# Patient Record
Sex: Female | Born: 1955 | Race: White | Hispanic: No | Smoking: Never smoker
Health system: Southern US, Community
[De-identification: ages and names within clinical notes are randomized; demographics above are authoritative.]

## PROBLEM LIST (undated history)

## (undated) DIAGNOSIS — I639 Cerebral infarction, unspecified: Secondary | ICD-10-CM

## (undated) DIAGNOSIS — D649 Anemia, unspecified: Secondary | ICD-10-CM

## (undated) DIAGNOSIS — T4145XA Adverse effect of unspecified anesthetic, initial encounter: Secondary | ICD-10-CM

## (undated) DIAGNOSIS — J189 Pneumonia, unspecified organism: Secondary | ICD-10-CM

## (undated) DIAGNOSIS — J690 Pneumonitis due to inhalation of food and vomit: Secondary | ICD-10-CM

## (undated) DIAGNOSIS — F840 Autistic disorder: Secondary | ICD-10-CM

## (undated) DIAGNOSIS — F419 Anxiety disorder, unspecified: Secondary | ICD-10-CM

## (undated) DIAGNOSIS — F32A Depression, unspecified: Secondary | ICD-10-CM

## (undated) DIAGNOSIS — F71 Moderate intellectual disabilities: Secondary | ICD-10-CM

## (undated) DIAGNOSIS — R569 Unspecified convulsions: Secondary | ICD-10-CM

## (undated) DIAGNOSIS — M436 Torticollis: Secondary | ICD-10-CM

## (undated) DIAGNOSIS — K810 Acute cholecystitis: Secondary | ICD-10-CM

## (undated) DIAGNOSIS — C23 Malignant neoplasm of gallbladder: Secondary | ICD-10-CM

## (undated) DIAGNOSIS — T17800A Unspecified foreign body in other parts of respiratory tract causing asphyxiation, initial encounter: Secondary | ICD-10-CM

## (undated) DIAGNOSIS — Z8669 Personal history of other diseases of the nervous system and sense organs: Secondary | ICD-10-CM

## (undated) DIAGNOSIS — M797 Fibromyalgia: Secondary | ICD-10-CM

## (undated) DIAGNOSIS — T8859XA Other complications of anesthesia, initial encounter: Secondary | ICD-10-CM

## (undated) DIAGNOSIS — F329 Major depressive disorder, single episode, unspecified: Secondary | ICD-10-CM

## (undated) HISTORY — PX: WISDOM TOOTH EXTRACTION: SHX21

## (undated) HISTORY — PX: COLONOSCOPY: SHX174

## (undated) HISTORY — PX: BREAST BIOPSY: SHX20

---

## 1997-08-10 ENCOUNTER — Ambulatory Visit (HOSPITAL_COMMUNITY): Admission: RE | Admit: 1997-08-10 | Discharge: 1997-08-10 | Payer: Self-pay | Admitting: *Deleted

## 1997-08-14 ENCOUNTER — Inpatient Hospital Stay (HOSPITAL_COMMUNITY): Admission: AD | Admit: 1997-08-14 | Discharge: 1997-08-14 | Payer: Self-pay | Admitting: *Deleted

## 1997-08-17 ENCOUNTER — Inpatient Hospital Stay (HOSPITAL_COMMUNITY): Admission: RE | Admit: 1997-08-17 | Discharge: 1997-08-17 | Payer: Self-pay | Admitting: *Deleted

## 1997-09-04 ENCOUNTER — Other Ambulatory Visit: Admission: RE | Admit: 1997-09-04 | Discharge: 1997-09-04 | Payer: Self-pay | Admitting: *Deleted

## 1997-09-16 ENCOUNTER — Inpatient Hospital Stay (HOSPITAL_COMMUNITY): Admission: AD | Admit: 1997-09-16 | Discharge: 1997-09-16 | Payer: Self-pay | Admitting: *Deleted

## 1998-01-15 ENCOUNTER — Encounter: Payer: Self-pay | Admitting: Family Medicine

## 1998-01-15 ENCOUNTER — Ambulatory Visit (HOSPITAL_COMMUNITY): Admission: RE | Admit: 1998-01-15 | Discharge: 1998-01-15 | Payer: Self-pay | Admitting: Family Medicine

## 1998-02-17 ENCOUNTER — Inpatient Hospital Stay (HOSPITAL_COMMUNITY): Admission: AD | Admit: 1998-02-17 | Discharge: 1998-02-17 | Payer: Self-pay | Admitting: Obstetrics and Gynecology

## 1998-09-13 ENCOUNTER — Other Ambulatory Visit: Admission: RE | Admit: 1998-09-13 | Discharge: 1998-09-13 | Payer: Self-pay | Admitting: *Deleted

## 1999-04-01 ENCOUNTER — Ambulatory Visit (HOSPITAL_COMMUNITY): Admission: RE | Admit: 1999-04-01 | Discharge: 1999-04-01 | Payer: Self-pay | Admitting: *Deleted

## 1999-04-01 ENCOUNTER — Encounter: Payer: Self-pay | Admitting: *Deleted

## 1999-09-10 ENCOUNTER — Other Ambulatory Visit: Admission: RE | Admit: 1999-09-10 | Discharge: 1999-09-10 | Payer: Self-pay | Admitting: Obstetrics and Gynecology

## 1999-12-10 ENCOUNTER — Inpatient Hospital Stay (HOSPITAL_COMMUNITY): Admission: EM | Admit: 1999-12-10 | Discharge: 1999-12-13 | Payer: Self-pay | Admitting: *Deleted

## 1999-12-12 ENCOUNTER — Encounter: Payer: Self-pay | Admitting: *Deleted

## 2000-04-06 ENCOUNTER — Ambulatory Visit (HOSPITAL_COMMUNITY): Admission: RE | Admit: 2000-04-06 | Discharge: 2000-04-06 | Payer: Self-pay | Admitting: *Deleted

## 2000-04-06 ENCOUNTER — Encounter: Payer: Self-pay | Admitting: *Deleted

## 2000-10-01 ENCOUNTER — Other Ambulatory Visit: Admission: RE | Admit: 2000-10-01 | Discharge: 2000-10-01 | Payer: Self-pay | Admitting: Obstetrics and Gynecology

## 2001-04-14 ENCOUNTER — Ambulatory Visit (HOSPITAL_COMMUNITY): Admission: RE | Admit: 2001-04-14 | Discharge: 2001-04-14 | Payer: Self-pay | Admitting: *Deleted

## 2001-04-14 ENCOUNTER — Encounter: Payer: Self-pay | Admitting: *Deleted

## 2001-10-04 ENCOUNTER — Other Ambulatory Visit: Admission: RE | Admit: 2001-10-04 | Discharge: 2001-10-04 | Payer: Self-pay | Admitting: Obstetrics & Gynecology

## 2002-04-11 ENCOUNTER — Encounter: Payer: Self-pay | Admitting: *Deleted

## 2002-04-11 ENCOUNTER — Encounter: Admission: RE | Admit: 2002-04-11 | Discharge: 2002-04-11 | Payer: Self-pay | Admitting: *Deleted

## 2002-10-14 ENCOUNTER — Other Ambulatory Visit: Admission: RE | Admit: 2002-10-14 | Discharge: 2002-10-14 | Payer: Self-pay | Admitting: Obstetrics & Gynecology

## 2003-04-26 ENCOUNTER — Ambulatory Visit (HOSPITAL_COMMUNITY): Admission: RE | Admit: 2003-04-26 | Discharge: 2003-04-26 | Payer: Self-pay | Admitting: *Deleted

## 2003-11-15 ENCOUNTER — Emergency Department (HOSPITAL_COMMUNITY): Admission: EM | Admit: 2003-11-15 | Discharge: 2003-11-15 | Payer: Self-pay | Admitting: Emergency Medicine

## 2004-04-26 ENCOUNTER — Ambulatory Visit (HOSPITAL_COMMUNITY): Admission: RE | Admit: 2004-04-26 | Discharge: 2004-04-26 | Payer: Self-pay | Admitting: Family Medicine

## 2004-05-09 ENCOUNTER — Encounter: Admission: RE | Admit: 2004-05-09 | Discharge: 2004-05-09 | Payer: Self-pay | Admitting: Family Medicine

## 2005-08-19 ENCOUNTER — Encounter: Admission: RE | Admit: 2005-08-19 | Discharge: 2005-08-19 | Payer: Self-pay | Admitting: Family Medicine

## 2006-02-05 ENCOUNTER — Other Ambulatory Visit: Admission: RE | Admit: 2006-02-05 | Discharge: 2006-02-05 | Payer: Self-pay | Admitting: Family Medicine

## 2006-10-08 ENCOUNTER — Encounter: Admission: RE | Admit: 2006-10-08 | Discharge: 2006-10-08 | Payer: Self-pay | Admitting: Family Medicine

## 2007-04-30 ENCOUNTER — Encounter: Admission: RE | Admit: 2007-04-30 | Discharge: 2007-04-30 | Payer: Self-pay | Admitting: Family Medicine

## 2008-12-22 ENCOUNTER — Encounter: Admission: RE | Admit: 2008-12-22 | Discharge: 2008-12-22 | Payer: Self-pay | Admitting: Family Medicine

## 2009-01-08 ENCOUNTER — Encounter: Admission: RE | Admit: 2009-01-08 | Discharge: 2009-01-08 | Payer: Self-pay | Admitting: Family Medicine

## 2009-03-10 DIAGNOSIS — I639 Cerebral infarction, unspecified: Secondary | ICD-10-CM

## 2009-03-10 HISTORY — DX: Cerebral infarction, unspecified: I63.9

## 2009-11-19 ENCOUNTER — Inpatient Hospital Stay (HOSPITAL_COMMUNITY): Admission: EM | Admit: 2009-11-19 | Discharge: 2009-11-26 | Payer: Self-pay | Admitting: Emergency Medicine

## 2009-11-20 ENCOUNTER — Encounter (INDEPENDENT_AMBULATORY_CARE_PROVIDER_SITE_OTHER): Payer: Self-pay | Admitting: Internal Medicine

## 2009-11-21 ENCOUNTER — Ambulatory Visit: Payer: Self-pay | Admitting: Vascular Surgery

## 2009-11-21 ENCOUNTER — Encounter (INDEPENDENT_AMBULATORY_CARE_PROVIDER_SITE_OTHER): Payer: Self-pay | Admitting: Internal Medicine

## 2010-01-21 ENCOUNTER — Ambulatory Visit: Payer: Self-pay | Admitting: Pulmonary Disease

## 2010-01-24 ENCOUNTER — Ambulatory Visit: Payer: Self-pay | Admitting: Psychiatry

## 2010-02-14 ENCOUNTER — Inpatient Hospital Stay (HOSPITAL_COMMUNITY): Admission: EM | Admit: 2010-02-14 | Discharge: 2010-01-29 | Payer: Self-pay | Admitting: Emergency Medicine

## 2010-03-31 ENCOUNTER — Encounter: Payer: Self-pay | Admitting: Family Medicine

## 2010-05-21 LAB — BASIC METABOLIC PANEL
BUN: 17 mg/dL (ref 6–23)
BUN: 3 mg/dL — ABNORMAL LOW (ref 6–23)
CO2: 22 mEq/L (ref 19–32)
Calcium: 8.2 mg/dL — ABNORMAL LOW (ref 8.4–10.5)
Calcium: 8.2 mg/dL — ABNORMAL LOW (ref 8.4–10.5)
Calcium: 8.4 mg/dL (ref 8.4–10.5)
Calcium: 8.5 mg/dL (ref 8.4–10.5)
Chloride: 98 mEq/L (ref 96–112)
Creatinine, Ser: 0.52 mg/dL (ref 0.4–1.2)
Creatinine, Ser: 2.14 mg/dL — ABNORMAL HIGH (ref 0.4–1.2)
GFR calc Af Amer: 29 mL/min — ABNORMAL LOW (ref >60)
GFR calc Af Amer: 31 mL/min — ABNORMAL LOW (ref >60)
GFR calc Af Amer: >60 mL/min (ref >60)
GFR calc non Af Amer: 24 mL/min — ABNORMAL LOW (ref >60)
GFR calc non Af Amer: 26 mL/min — ABNORMAL LOW (ref >60)
GFR calc non Af Amer: 27 mL/min — ABNORMAL LOW (ref >60)
GFR calc non Af Amer: >60 mL/min (ref >60)
GFR calc non Af Amer: >60 mL/min (ref >60)
Glucose, Bld: 103 mg/dL — ABNORMAL HIGH (ref 70–99)
Glucose, Bld: 88 mg/dL (ref 70–99)
Potassium: 3.8 mEq/L (ref 3.5–5.1)
Potassium: 3.9 mEq/L (ref 3.5–5.1)
Sodium: 135 mEq/L (ref 135–145)
Sodium: 140 mEq/L (ref 135–145)
Sodium: 142 mEq/L (ref 135–145)

## 2010-05-21 LAB — GLUCOSE, CAPILLARY
Glucose-Capillary: 108 mg/dL — ABNORMAL HIGH (ref 70–99)
Glucose-Capillary: 123 mg/dL — ABNORMAL HIGH (ref 70–99)
Glucose-Capillary: 92 mg/dL (ref 70–99)
Glucose-Capillary: 95 mg/dL (ref 70–99)

## 2010-05-21 LAB — VITAMIN B12: Vitamin B-12: 1474 pg/mL — ABNORMAL HIGH (ref 211–911)

## 2010-05-21 LAB — CROSSMATCH
Antibody Screen: NEGATIVE
Unit division: 0

## 2010-05-21 LAB — URINALYSIS, ROUTINE W REFLEX MICROSCOPIC
Nitrite: POSITIVE — AB
Specific Gravity, Urine: 1.022 (ref 1.005–1.030)
pH: 6.5 (ref 5.0–8.0)

## 2010-05-21 LAB — COMPREHENSIVE METABOLIC PANEL
AST: 18 U/L (ref 0–37)
Albumin: 2.2 g/dL — ABNORMAL LOW (ref 3.5–5.2)
BUN: 1 mg/dL — ABNORMAL LOW (ref 6–23)
BUN: 8 mg/dL (ref 6–23)
CO2: 24 mEq/L (ref 19–32)
CO2: 25 mEq/L (ref 19–32)
Calcium: 8.2 mg/dL — ABNORMAL LOW (ref 8.4–10.5)
Chloride: 95 mEq/L — ABNORMAL LOW (ref 96–112)
Chloride: 99 mEq/L (ref 96–112)
Creatinine, Ser: 0.54 mg/dL (ref 0.4–1.2)
Creatinine, Ser: 0.61 mg/dL (ref 0.4–1.2)
Creatinine, Ser: 0.72 mg/dL (ref 0.4–1.2)
GFR calc Af Amer: >60 mL/min (ref >60)
GFR calc non Af Amer: >60 mL/min (ref >60)
GFR calc non Af Amer: >60 mL/min (ref >60)
Glucose, Bld: 124 mg/dL — ABNORMAL HIGH (ref 70–99)
Sodium: 132 mEq/L — ABNORMAL LOW (ref 135–145)
Total Bilirubin: 0.3 mg/dL (ref 0.3–1.2)
Total Bilirubin: 0.4 mg/dL (ref 0.3–1.2)

## 2010-05-21 LAB — URINE CULTURE
Culture  Setup Time: 201111151123
Special Requests: NEGATIVE

## 2010-05-21 LAB — DIFFERENTIAL
Eosinophils Relative: 0 % (ref 0–5)
Lymphs Abs: 1.4 10*3/uL (ref 0.7–4.0)
Monocytes Relative: 10 % (ref 3–12)
Neutro Abs: 23.6 10*3/uL — ABNORMAL HIGH (ref 1.7–7.7)

## 2010-05-21 LAB — CBC
HCT: 22.6 % — ABNORMAL LOW (ref 36.0–46.0)
HCT: 24.8 % — ABNORMAL LOW (ref 36.0–46.0)
Hemoglobin: 7.7 g/dL — ABNORMAL LOW (ref 12.0–15.0)
Hemoglobin: 8 g/dL — ABNORMAL LOW (ref 12.0–15.0)
Hemoglobin: 9 g/dL — ABNORMAL LOW (ref 12.0–15.0)
MCH: 30.4 pg (ref 26.0–34.0)
MCH: 30.8 pg (ref 26.0–34.0)
MCH: 31 pg (ref 26.0–34.0)
MCHC: 34 g/dL (ref 30.0–36.0)
MCHC: 34 g/dL (ref 30.0–36.0)
MCHC: 34 g/dL (ref 30.0–36.0)
MCHC: 34.1 g/dL (ref 30.0–36.0)
MCHC: 35.1 g/dL (ref 30.0–36.0)
MCV: 89.1 fL (ref 78.0–100.0)
MCV: 89.3 fL (ref 78.0–100.0)
MCV: 90.3 fL (ref 78.0–100.0)
Platelets: 463 10*3/uL — ABNORMAL HIGH (ref 150–400)
Platelets: 499 10*3/uL — ABNORMAL HIGH (ref 150–400)
Platelets: 527 10*3/uL — ABNORMAL HIGH (ref 150–400)
Platelets: 578 10*3/uL — ABNORMAL HIGH (ref 150–400)
RBC: 2.59 MIL/uL — ABNORMAL LOW (ref 3.87–5.11)
RBC: 3.17 MIL/uL — ABNORMAL LOW (ref 3.87–5.11)
RDW: 16.5 % — ABNORMAL HIGH (ref 11.5–15.5)
RDW: 16.9 % — ABNORMAL HIGH (ref 11.5–15.5)
WBC: 15.5 10*3/uL — ABNORMAL HIGH (ref 4.0–10.5)
WBC: 18.8 10*3/uL — ABNORMAL HIGH (ref 4.0–10.5)
WBC: 27.8 10*3/uL — ABNORMAL HIGH (ref 4.0–10.5)

## 2010-05-21 LAB — VANCOMYCIN, TROUGH: Vancomycin Tr: 56.5 ug/mL (ref 10.0–20.0)

## 2010-05-21 LAB — URINE MICROSCOPIC-ADD ON

## 2010-05-21 LAB — HEMOGLOBIN A1C
Hgb A1c MFr Bld: 5.7 % — ABNORMAL HIGH (ref <5.7)
Mean Plasma Glucose: 117 mg/dL — ABNORMAL HIGH (ref <117)

## 2010-05-21 LAB — ABO/RH: ABO/RH(D): O NEG

## 2010-05-21 LAB — OVA AND PARASITE EXAMINATION: Ova and parasites: NONE SEEN

## 2010-05-21 LAB — RETICULOCYTES
RBC.: 3 MIL/uL — ABNORMAL LOW (ref 3.87–5.11)
Retic Count, Absolute: 33 10*3/uL (ref 19.0–186.0)

## 2010-05-21 LAB — PROCALCITONIN: Procalcitonin: 0.54 ng/mL

## 2010-05-21 LAB — IRON AND TIBC
Iron: 11 ug/dL — ABNORMAL LOW (ref 42–135)
TIBC: 143 ug/dL — ABNORMAL LOW (ref 250–470)

## 2010-05-21 LAB — LACTIC ACID, PLASMA: Lactic Acid, Venous: 1 mmol/L (ref 0.5–2.2)

## 2010-05-21 LAB — HEMOCCULT GUIAC POC 1CARD (OFFICE): Fecal Occult Bld: NEGATIVE

## 2010-05-21 LAB — CULTURE, BLOOD (ROUTINE X 2)
Culture  Setup Time: 201111150825
Culture: NO GROWTH

## 2010-05-21 LAB — STOOL CULTURE

## 2010-05-21 LAB — MRSA CULTURE

## 2010-05-21 LAB — FERRITIN: Ferritin: 575 ng/mL — ABNORMAL HIGH (ref 10–291)

## 2010-05-21 LAB — LIPASE, BLOOD: Lipase: 20 U/L (ref 11–59)

## 2010-05-23 LAB — COMPREHENSIVE METABOLIC PANEL
AST: 20 U/L (ref 0–37)
Albumin: 3.7 g/dL (ref 3.5–5.2)
Alkaline Phosphatase: 109 U/L (ref 39–117)
Chloride: 86 mEq/L — ABNORMAL LOW (ref 96–112)
GFR calc Af Amer: >60 mL/min (ref >60)
Potassium: 3.9 mEq/L (ref 3.5–5.1)
Sodium: 120 mEq/L — ABNORMAL LOW (ref 135–145)
Total Bilirubin: 0.6 mg/dL (ref 0.3–1.2)
Total Protein: 7.1 g/dL (ref 6.0–8.3)

## 2010-05-23 LAB — BASIC METABOLIC PANEL
BUN: 16 mg/dL (ref 6–23)
BUN: 27 mg/dL — ABNORMAL HIGH (ref 6–23)
BUN: 14 mg/dL (ref 6–23)
CO2: 26 mEq/L (ref 19–32)
CO2: 27 mEq/L (ref 19–32)
Calcium: 9.1 mg/dL (ref 8.4–10.5)
Chloride: 82 mEq/L — ABNORMAL LOW (ref 96–112)
Chloride: 85 mEq/L — ABNORMAL LOW (ref 96–112)
Chloride: 86 mEq/L — ABNORMAL LOW (ref 96–112)
Chloride: 86 mEq/L — ABNORMAL LOW (ref 96–112)
Chloride: 83 mEq/L — ABNORMAL LOW (ref 96–112)
Creatinine, Ser: 1.24 mg/dL — ABNORMAL HIGH (ref 0.4–1.2)
Creatinine, Ser: 0.74 mg/dL (ref 0.4–1.2)
GFR calc Af Amer: 55 mL/min — ABNORMAL LOW (ref >60)
GFR calc Af Amer: >60 mL/min (ref >60)
GFR calc Af Amer: >60 mL/min (ref >60)
GFR calc Af Amer: >60 mL/min (ref >60)
GFR calc Af Amer: >60 mL/min (ref >60)
GFR calc Af Amer: >60 mL/min (ref >60)
GFR calc non Af Amer: 45 mL/min — ABNORMAL LOW (ref >60)
GFR calc non Af Amer: 50 mL/min — ABNORMAL LOW (ref >60)
GFR calc non Af Amer: >60 mL/min (ref >60)
GFR calc non Af Amer: >60 mL/min (ref >60)
GFR calc non Af Amer: >60 mL/min (ref >60)
Potassium: 4.2 mEq/L (ref 3.5–5.1)
Potassium: 4.3 mEq/L (ref 3.5–5.1)
Potassium: 4.4 mEq/L (ref 3.5–5.1)
Potassium: 4.5 mEq/L (ref 3.5–5.1)
Potassium: 4.7 mEq/L (ref 3.5–5.1)
Potassium: 4.8 mEq/L (ref 3.5–5.1)
Sodium: 119 mEq/L — CL (ref 135–145)
Sodium: 120 mEq/L — ABNORMAL LOW (ref 135–145)

## 2010-05-23 LAB — CORTISOL: Cortisol, Plasma: 23.5 ug/dL

## 2010-05-23 LAB — DIFFERENTIAL
Basophils Absolute: 0.1 10*3/uL (ref 0.0–0.1)
Basophils Absolute: 0 10*3/uL (ref 0.0–0.1)
Basophils Absolute: 0 10*3/uL (ref 0.0–0.1)
Basophils Absolute: 0 10*3/uL (ref 0.0–0.1)
Basophils Relative: 0 % (ref 0–1)
Eosinophils Relative: 1 % (ref 0–5)
Eosinophils Relative: 1 % (ref 0–5)
Eosinophils Relative: 1 % (ref 0–5)
Lymphocytes Relative: 20 % (ref 12–46)
Lymphocytes Relative: 20 % (ref 12–46)
Lymphocytes Relative: 22 % (ref 12–46)
Lymphs Abs: 2.9 10*3/uL (ref 0.7–4.0)
Lymphs Abs: 3.1 10*3/uL (ref 0.7–4.0)
Monocytes Absolute: 1.2 10*3/uL — ABNORMAL HIGH (ref 0.1–1.0)
Monocytes Absolute: 1.1 10*3/uL — ABNORMAL HIGH (ref 0.1–1.0)
Monocytes Absolute: 1.2 10*3/uL — ABNORMAL HIGH (ref 0.1–1.0)
Monocytes Relative: 8 % (ref 3–12)
Monocytes Relative: 9 % (ref 3–12)
Neutro Abs: 10.3 10*3/uL — ABNORMAL HIGH (ref 1.7–7.7)
Neutro Abs: 10.5 10*3/uL — ABNORMAL HIGH (ref 1.7–7.7)
Neutro Abs: 8.4 10*3/uL — ABNORMAL HIGH (ref 1.7–7.7)
Neutro Abs: 8.4 10*3/uL — ABNORMAL HIGH (ref 1.7–7.7)
Neutrophils Relative %: 68 % (ref 43–77)

## 2010-05-23 LAB — CBC
HCT: 33.8 % — ABNORMAL LOW (ref 36.0–46.0)
HCT: 35.4 % — ABNORMAL LOW (ref 36.0–46.0)
HCT: 34.7 % — ABNORMAL LOW (ref 36.0–46.0)
HCT: 36.6 % (ref 36.0–46.0)
Hemoglobin: 11.6 g/dL — ABNORMAL LOW (ref 12.0–15.0)
Hemoglobin: 12 g/dL (ref 12.0–15.0)
Hemoglobin: 12.8 g/dL (ref 12.0–15.0)
MCH: 32.7 pg (ref 26.0–34.0)
MCV: 93.1 fL (ref 78.0–100.0)
MCV: 93.7 fL (ref 78.0–100.0)
MCV: 93.8 fL (ref 78.0–100.0)
MCV: 94.6 fL (ref 78.0–100.0)
MCV: 92.9 fL (ref 78.0–100.0)
MCV: 93.4 fL (ref 78.0–100.0)
Platelets: 251 10*3/uL (ref 150–400)
Platelets: 270 10*3/uL (ref 150–400)
Platelets: 271 10*3/uL (ref 150–400)
Platelets: 288 10*3/uL (ref 150–400)
Platelets: 312 10*3/uL (ref 150–400)
RBC: 3.54 MIL/uL — ABNORMAL LOW (ref 3.87–5.11)
RBC: 3.6 MIL/uL — ABNORMAL LOW (ref 3.87–5.11)
RBC: 3.63 MIL/uL — ABNORMAL LOW (ref 3.87–5.11)
RBC: 3.8 MIL/uL — ABNORMAL LOW (ref 3.87–5.11)
RBC: 3.69 MIL/uL — ABNORMAL LOW (ref 3.87–5.11)
RDW: 15 % (ref 11.5–15.5)
RDW: 15.1 % (ref 11.5–15.5)
RDW: 15.2 % (ref 11.5–15.5)
RDW: 15.3 % (ref 11.5–15.5)
RDW: 15.4 % (ref 11.5–15.5)
RDW: 14.9 % (ref 11.5–15.5)
RDW: 14.9 % (ref 11.5–15.5)
RDW: 15.6 % — ABNORMAL HIGH (ref 11.5–15.5)
WBC: 10.6 10*3/uL — ABNORMAL HIGH (ref 4.0–10.5)
WBC: 11.5 10*3/uL — ABNORMAL HIGH (ref 4.0–10.5)
WBC: 12.4 10*3/uL — ABNORMAL HIGH (ref 4.0–10.5)
WBC: 14.5 10*3/uL — ABNORMAL HIGH (ref 4.0–10.5)
WBC: 14.8 10*3/uL — ABNORMAL HIGH (ref 4.0–10.5)
WBC: 11.4 10*3/uL — ABNORMAL HIGH (ref 4.0–10.5)
WBC: 11.9 10*3/uL — ABNORMAL HIGH (ref 4.0–10.5)
WBC: 12.4 10*3/uL — ABNORMAL HIGH (ref 4.0–10.5)
WBC: 15 10*3/uL — ABNORMAL HIGH (ref 4.0–10.5)

## 2010-05-23 LAB — LIPID PANEL
Cholesterol: 171 mg/dL (ref 0–200)
LDL Cholesterol: 82 mg/dL (ref 0–99)
Total CHOL/HDL Ratio: 2.5 RATIO
VLDL: 20 mg/dL (ref 0–40)

## 2010-05-23 LAB — URINALYSIS, ROUTINE W REFLEX MICROSCOPIC
Ketones, ur: NEGATIVE mg/dL
Leukocytes, UA: NEGATIVE
Leukocytes, UA: NEGATIVE
Nitrite: NEGATIVE
Protein, ur: NEGATIVE mg/dL
Specific Gravity, Urine: 1.011 (ref 1.005–1.030)
Specific Gravity, Urine: 1.014 (ref 1.005–1.030)
Urobilinogen, UA: 0.2 mg/dL (ref 0.0–1.0)
pH: 7 (ref 5.0–8.0)

## 2010-05-23 LAB — MAGNESIUM
Magnesium: 2.1 mg/dL (ref 1.5–2.5)
Magnesium: 2.3 mg/dL (ref 1.5–2.5)
Magnesium: 1.9 mg/dL (ref 1.5–2.5)

## 2010-05-23 LAB — ANA: Anti Nuclear Antibody(ANA): NEGATIVE

## 2010-05-23 LAB — SODIUM, URINE, RANDOM: Sodium, Ur: 61 mEq/L

## 2010-05-23 LAB — URINE MICROSCOPIC-ADD ON

## 2010-05-23 LAB — RENAL FUNCTION PANEL
Albumin: 3.3 g/dL — ABNORMAL LOW (ref 3.5–5.2)
Albumin: 3.4 g/dL — ABNORMAL LOW (ref 3.5–5.2)
BUN: 24 mg/dL — ABNORMAL HIGH (ref 6–23)
Calcium: 9.3 mg/dL (ref 8.4–10.5)
GFR calc Af Amer: >60 mL/min (ref >60)
GFR calc non Af Amer: >60 mL/min (ref >60)
Glucose, Bld: 104 mg/dL — ABNORMAL HIGH (ref 70–99)
Phosphorus: 3.6 mg/dL (ref 2.3–4.6)
Phosphorus: 3.9 mg/dL (ref 2.3–4.6)
Potassium: 4.1 mEq/L (ref 3.5–5.1)
Sodium: 128 mEq/L — ABNORMAL LOW (ref 135–145)
Sodium: 132 mEq/L — ABNORMAL LOW (ref 135–145)

## 2010-05-23 LAB — URIC ACID
Uric Acid, Serum: 3.8 mg/dL (ref 2.4–7.0)
Uric Acid, Serum: 5 mg/dL (ref 2.4–7.0)

## 2010-05-23 LAB — HOMOCYSTEINE: Homocysteine: 7.1 umol/L (ref 4.0–15.4)

## 2010-05-23 LAB — PHOSPHORUS: Phosphorus: 5.6 mg/dL — ABNORMAL HIGH (ref 2.3–4.6)

## 2010-05-23 LAB — RAPID URINE DRUG SCREEN, HOSP PERFORMED
Amphetamines: NOT DETECTED
Benzodiazepines: POSITIVE — AB
Cocaine: NOT DETECTED
Tetrahydrocannabinol: NOT DETECTED

## 2010-05-23 LAB — CULTURE, BLOOD (ROUTINE X 2): Culture  Setup Time: 201109150237

## 2010-05-23 LAB — MRSA PCR SCREENING: MRSA by PCR: POSITIVE — AB

## 2010-05-23 LAB — TSH: TSH: 2.142 u[IU]/mL (ref 0.350–4.500)

## 2010-05-23 LAB — ALBUMIN: Albumin: 3.5 g/dL (ref 3.5–5.2)

## 2010-05-23 LAB — URINE CULTURE
Colony Count: NO GROWTH
Culture  Setup Time: 201109122104

## 2010-05-23 LAB — POCT CARDIAC MARKERS: Troponin i, poc: <0.05 ng/mL (ref 0.00–0.09)

## 2010-05-23 LAB — PROTIME-INR
INR: 1.11 (ref 0.00–1.49)
Prothrombin Time: 14.5 seconds (ref 11.6–15.2)

## 2010-05-23 LAB — SEDIMENTATION RATE: Sed Rate: 2 mm/hr (ref 0–22)

## 2010-05-23 LAB — APTT: aPTT: 39 seconds — ABNORMAL HIGH (ref 24–37)

## 2010-05-23 LAB — CARDIAC PANEL(CRET KIN+CKTOT+MB+TROPI): Troponin I: 0.01 ng/mL (ref 0.00–0.06)

## 2010-07-24 ENCOUNTER — Emergency Department (HOSPITAL_COMMUNITY): Payer: Medicare Other

## 2010-07-24 ENCOUNTER — Emergency Department (HOSPITAL_COMMUNITY)
Admission: EM | Admit: 2010-07-24 | Discharge: 2010-07-24 | Disposition: A | Discharge status: Home / Self Care | Payer: Medicare Other | Attending: Emergency Medicine | Admitting: Emergency Medicine

## 2010-07-24 DIAGNOSIS — W1809XA Striking against other object with subsequent fall, initial encounter: Secondary | ICD-10-CM | POA: Insufficient documentation

## 2010-07-24 DIAGNOSIS — Y9210 Unspecified residential institution as the place of occurrence of the external cause: Secondary | ICD-10-CM | POA: Insufficient documentation

## 2010-07-24 DIAGNOSIS — M5030 Other cervical disc degeneration, unspecified cervical region: Secondary | ICD-10-CM | POA: Insufficient documentation

## 2010-07-24 DIAGNOSIS — S0003XA Contusion of scalp, initial encounter: Secondary | ICD-10-CM | POA: Insufficient documentation

## 2010-07-24 DIAGNOSIS — F79 Unspecified intellectual disabilities: Secondary | ICD-10-CM | POA: Insufficient documentation

## 2010-07-24 DIAGNOSIS — S0100XA Unspecified open wound of scalp, initial encounter: Secondary | ICD-10-CM | POA: Insufficient documentation

## 2010-07-24 DIAGNOSIS — G40909 Epilepsy, unspecified, not intractable, without status epilepticus: Secondary | ICD-10-CM | POA: Insufficient documentation

## 2010-07-24 DIAGNOSIS — F840 Autistic disorder: Secondary | ICD-10-CM | POA: Insufficient documentation

## 2010-07-24 DIAGNOSIS — M25529 Pain in unspecified elbow: Secondary | ICD-10-CM | POA: Insufficient documentation

## 2010-07-26 NOTE — Discharge Summary (Signed)
[facility]  Patient:    Sydney Hatfield, Sydney Hatfield                  MRN: [id number] Adm. Date:  [id number] Disc. Date: [id number] Attending:  Otilio Saber Dictator:   Johnella Moloney, NP                           Discharge Summary  HISTORY OF PRESENT ILLNESS:  Sydney Hatfield is a 55 year old white single female admitted to [facility] for anxiety and suicidal ideation. Sydney Hatfield is either unable to or still very confused about the reason for hospitalization and reviewing therapeutic health assessment.  Parents had to supply much of the information, stating that this patient has been very confused.  She was putting shampoo into her eyes and wanting to take an overdose of her medication.  Patient has a history of mental retardation and seizure disorder.  Apparently, patient has had a few falls recently as well. Patient reports that she has been sleeping well, appetite not good, but reports having headaches.  Reports seeing dots, no auditory hallucinations. She denies currently that she has intentions of hurting herself and is quite confused about the fact that she has taken some extra medications.  As stated, the patient is quite confused and is not oriented to her age, and she is aware of where she is right now, but emphatically states she wants to go home and that she is here only for her headaches.  PAST PSYCHIATRIC HISTORY:  The patient has had no prior psychiatric hospitalization.  She attends [facility] mental health center on an outpatient basis.  PAST MEDICAL HISTORY:  Primary care physician is Dr. Dennie Fetters or Dr. Elige Radon. Medical problems include migraine headaches, seizures.  History of mental retardation with an IQ of 10-70.  Admission medications are Tegretol, Hyoscyamine, takes a vitamin, Allegra, propranolol, lorazepam, Necon.  DRUG ALLERGIES:  No known drug allergies, according to the patient.  PHYSICAL EXAMINATION:  Was  done on patient and it was basically no positive findings, although it took a lot of coaxing for her to perform what was requested of her.  LABORATORY DATA:  Her CBC with diff had a low RBC of 3.62, hemoglobin low at 11.8, hematocrit low at 32.4, MCHC high at 36.3.  CMET had a low sodium at 127 and a chloride low at 93.  Her thyroid panel was within normal limits.  Her Tegretol level was pending on December 11, 1999.  Her urinalysis had small amount of hemoglobin in it and a few epithelial cells.  Patient also had a CT scan.  MENTAL STATUS EXAMINATION:  On admission, alert, cooperative, somewhat unkempt, fidgety for the interview, fair eye contact.  Speech normal tone but it is irrelevant.  She answers inappropriately to questions.  For example, when asked "are you married," she said "no, but my parents are."  Affect blunted.  She denies any suicidal ideation.  Thought process questionable with her history of mental retardation.  Questionable visual hallucinations.  She says she sees dots, but she also says she has been having other visual problems.  She is an unreliable historian.  Cognitively, oriented to place and her name, but she is unsure of the year and her age, and she said she is 55 years old.  Memory uncertain.  Judgment poor, insight poor.  ADMITTING DIAGNOSES: Axis I:     Anxiety disorder. 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 Axis III:   Seizure disorder and headaches. Axis IV:    Moderate, related to psychosocial problems, medical problems.Axis Axis V:     Current global assessment of function 40, highest past year 50.  HOSPITAL COURSE:  The patient was admitted to [facility] unit and she was placed on Tegretol 200 mg 1.5 tablets morning, 1 at 5 p.m. and h.s., Inderal 20 mg t.i.d., Paxil 20 mg two at h.s., Necon one q.a.m., could use her own.  Ativan 1 mg 1/2 tab q.4h. p.r.n., Allegra one tab q.a.m. and at 5 p.m.  Multitabs one q.d.  and Restoril 15 mg h.s. p.r.n. sleep.  On October 3, the Allegra was discontinued.  On October 4, we decreased her Paxil to 20 at h.s. and also ordered an MRI of the brain with the recent behavior changes and headaches and history of repeated falls.   The MRI was subsequently cancelled and a CT scan with and without contrast was ordered for the same reasons.  While she was in the hospital, she did report feeling better.  She was still anxious and continued to have some depression with suicidal ideation, and she continued to have some suicidal thoughts, saying she is afraid of her future when she gets better.  She denies sleep or appetite disturbance and did report a history of racing thoughts.  Her family reports marked behavioral changes with manic symptoms.  She has had increased headaches and repeated falls.  Paxil was decreased.  On October 5, she states she is feeling better, reported her thoughts were slower and she denied suicidal ideation.  She seemed more coherent.  Slept well.  Appetite good. Patient seemed better with less medication, but will need to get feedback from her family.  Her CT scan was normal.  On October 5, the patients family felt she was doing well and it was decided she could be managed by her family on an outpatient basis.  CONDITION ON DISCHARGE:  Patient is discharged in improved condition, with improvement in mood, sleep, appetite, alleviation of any suicidal or homicidal ideation, improvement in energy, and no confusion on discharge.  DISPOSITION:  The patient was discharged home with her parents.  FOLLOW UP:  Patient is to follow up with the [facility] mental health center October 10 at 1:45 p.m.  DISCHARGE MEDICATIONS: 1. Tegretol 200 mg, 1-1/2 in morning, one at 5 p.m. and one at bedtime. 2. Paxil 20 mg one tab at h.s. 3. Necon 1/35 one tablet daily. 4. Propranolol 20 mg one tab t.i.d.  FINAL DIAGNOSIS: Axis I:     Generalized anxiety  disorder. Axis II:    Mental retardation, mild, IQ 68-70.  Axis III:   Seizure disorder, headaches. Axis IV:    Mild. Axis V:     Current global assessment of function 49, highest past year 50. DD:  12/18/99 TD:  12/19/99 Job: [id number]

## 2010-07-26 NOTE — H&P (Signed)
[facility]  Patient:    Sydney Hatfield, Sydney Hatfield                  MRN: [id number] Adm. Date:  [id number] Attending:  Otilio Saber Dictator:   Candi Leash. Orsini, N.P.                         History and Physical  REASON FOR ADMISSION:  Anxiety and suicidal ideation.  REVIEW OF SYSTEMS:  Very difficult to obtain.  The patient is a poor historian and seemed to complain about everything.  But generally, without going into every system, the patient seemed to complain mostly of headache on her left side.  She also said that her eyes have been hurting some.  She does not wear glasses.  She has had several falls lately.  She also has a problem with some abnormal sneezing from environmental allergies.  Otherwise, her review of systems is negative.  PHYSICAL EXAMINATION:  Vital signs: Temperature 97.2, heart rate 85, respiratory rate 20, blood pressure 147/91.  She is approximately 5 feet 3 inches tall, weight 129.  General appearance: A 55 year old white female sitting on exam table, no acute distress.  She is average in stature, appears her stated age.  The patient is somewhat unkempt, talking about going home primarily during the exam and complaining of her headache.  Her head is normocephalic.  She can raise her eyebrows.  Again, some of this physical examination was difficult to obtain possibly due to her IQ.  Eyes are equal and reactive to light.  She could not adequately perform the EOM.  Funduscopic exam was within normal limits.  TM are intact.  She seemed to complain of some frontal sinus tenderness.  Mouth: Mucosa is moist.  Dentition was good.  No lesions were seen.  Tongue protrudes to midline.  She can puff out her cheeks.  She did not seem to comprehend to clench her teeth.  Pharynx is within normal limits.  Neck is supple, no JVD, negative lymphadenopathy.  Trachea is midline.  Thyroid is nontender, not enlarged.  Respiratory: Breath sounds  are clear to auscultation, no adventitious sounds.  Heart rate: Regular rate and rhythm without murmurs.  Carotid pulses are equal and adequate.  Pedal pulses were equal.  No edema or varicosities were noted.  Abdomen: Soft, nontender, no organomegaly, active bowel sounds, no CVA tenderness.  Muscular: No joint swelling or deformity.  The patient was a little bit unsteady to adequately assess her gait.  She did walk but was unable to perform walking on her heels. She did not comprehend her muscle strength.  She did seem to have good muscle tone.  Skin is warm and dry.  Nail beds are painted, could not assess her capillary refill.  She had equal bilateral radial pulses.  Neurologic: Orientation was assessed but she knew her name, was unable to tell me time and place.  Deep tendon reflexes were 2+.  Gait: She would walk for a few feet and then felt a little bit unsteady.  Romberg was negative.  No tremor noted.  No involuntary movements.  Coordination was intact with a lot of coaxing to perform that exam. DD:  12/12/99 TD:  12/12/99 Job: [id number]

## 2010-07-26 NOTE — H&P (Signed)
[facility]  Patient:    Sydney Hatfield, Sydney Hatfield                  MRN: [id number] Adm. Date:  [id number] Attending:  Otilio Saber Dictator:   Landry Corporal, NP                   Psychiatric Admission Assessment  DATE OF ADMISSION:  December 10, 1999  PATIENT IDENTIFICATION:  This is a 55 year old white single female admitted to [facility] for anxiety and suicidal ideation.  HISTORY OF PRESENT ILLNESS:  Ms. Tallman is either unable to or still very confused about the reason for her hospitalization.  In reviewing therapeutic health assessment, parents had to supply much of the information, stating that this patient has been very confused.  She was putting shampoo into her eyes and wanting to take an overdose of her medications.  Patient has a history of mental retardation and seizure disorder.  Apparently, patient has had a few falls recently as well, and patient reports that she has been sleeping well. Her appetite has been good, but she reports having headaches.  She reports seeing dots.  No auditory hallucinations.  She denies currently that she has intentions of hurting herself, and is quite confused about the fact that she had taken some extra medication.  As stated, patient is quite confused and is not oriented to her age, and she is aware of where she is right now, but emphatically states that she wants to go home and that she is here only for her headaches.  PAST PSYCHIATRIC HISTORY:  She has no prior hospitalizations.  She been going to [facility] mental health center on an outpatient basis.  SUBSTANCE ABUSE HISTORY:  She states she does not drink any alcohol or use any street drugs.  PAST MEDICAL HISTORY:  Her primary care physician is Dr. Dennie Fetters or Dr. Elige Radon. Her medical problems include migraine headaches and seizures.  She has a history of mental retardation with an IQ of 82-70.  Her medications are Tegretol,  Hyoscyamine.  She also takes a vitamin, Allegra, propranolol, lorazepam, and Necon.  POSITIVE PHYSICAL FINDINGS:  Physical examination is pending.  Labs were drawn this morning.  We are awaiting those results.  SOCIAL HISTORY:  She is a 55 year old white single female, no children.  She lives with her parents.  Her work history again, she really could not specify that she does any kind of employment.  Although she did describe things, she never actually said that she does this type of work.  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  She states she completed high school, that she does not smoke, and that she has a sister named Amil Amen.  FAMILY HISTORY:  She is unsure if there is any family history with regards to psychiatric problems.  MENTAL STATUS EXAMINATION:  She is alert.  She is cooperative.  She is somewhat unkempt.  She is fidgety for the interview.  She has fair eye contact.  Her speech is normal tone.  It is irrelevant.  She answers inappropriately to questions.  For example, when asked "are you married?" she says "no, but my parents are."  Her affect is blunted.  She denies any suicidal ideations today.  Thought processes are questionable with her history of mental retardation.  Questionable visual hallucination as she says she sees dots, but she also says she has been having other visual problems.  She is an unreliable  historian.  Cognitively, she is oriented to place and her name, but she is unsure of the year and her age, as she said she is 55 years old.  Her memory is uncertain.  Her judgment is poor.  Her insight is poor.  ADMISSION DIAGNOSES: Axis I:    Anxiety disorder. Axis II:   Mental retardation, mild, with an IQ of 68-70. Axis III:  Seizure disorder and headaches. Axis IV:   Moderate, related to other psychosocial problems and medical            problems. Axis V:    Current global assessment of function is 40, highest past year is            50.  INITIAL PLAN OF  CARE:  Voluntary admission is [facility] for anxiety and suicidal ideation.  Contract for safety.  Check every 15 minutes. She will resume her medications, her Tegretol, propranolol, Paxil, Necon, Ativan, Allegra, and multivitamins.  We will offer her a sleep medication.  ESTIMATED LENGTH OF STAY:  Three to five days. DD:  12/11/99 TD:  12/11/99 Job: [id number]

## 2010-09-13 ENCOUNTER — Observation Stay (HOSPITAL_COMMUNITY)
Admission: EM | Admit: 2010-09-13 | Discharge: 2010-09-15 | Disposition: A | Discharge status: Home / Self Care | Payer: Medicare Other | Source: Other Acute Inpatient Hospital | Attending: Emergency Medicine | Admitting: Emergency Medicine

## 2010-09-13 ENCOUNTER — Emergency Department (INDEPENDENT_AMBULATORY_CARE_PROVIDER_SITE_OTHER): Payer: Medicare Other

## 2010-09-13 ENCOUNTER — Emergency Department (HOSPITAL_BASED_OUTPATIENT_CLINIC_OR_DEPARTMENT_OTHER)
Admission: EM | Admit: 2010-09-13 | Discharge: 2010-09-13 | Disposition: A | Discharge status: Other Acute Inpatient Hospital | Payer: Medicare Other | Source: Home / Self Care | Attending: Emergency Medicine | Admitting: Emergency Medicine

## 2010-09-13 DIAGNOSIS — K219 Gastro-esophageal reflux disease without esophagitis: Secondary | ICD-10-CM | POA: Insufficient documentation

## 2010-09-13 DIAGNOSIS — R509 Fever, unspecified: Secondary | ICD-10-CM | POA: Insufficient documentation

## 2010-09-13 DIAGNOSIS — Z79899 Other long term (current) drug therapy: Secondary | ICD-10-CM | POA: Insufficient documentation

## 2010-09-13 DIAGNOSIS — R569 Unspecified convulsions: Secondary | ICD-10-CM

## 2010-09-13 DIAGNOSIS — M436 Torticollis: Secondary | ICD-10-CM | POA: Insufficient documentation

## 2010-09-13 DIAGNOSIS — F840 Autistic disorder: Secondary | ICD-10-CM | POA: Insufficient documentation

## 2010-09-13 DIAGNOSIS — R4182 Altered mental status, unspecified: Secondary | ICD-10-CM | POA: Insufficient documentation

## 2010-09-13 DIAGNOSIS — N390 Urinary tract infection, site not specified: Secondary | ICD-10-CM | POA: Insufficient documentation

## 2010-09-13 DIAGNOSIS — I10 Essential (primary) hypertension: Secondary | ICD-10-CM | POA: Insufficient documentation

## 2010-09-13 DIAGNOSIS — I679 Cerebrovascular disease, unspecified: Secondary | ICD-10-CM

## 2010-09-13 DIAGNOSIS — A419 Sepsis, unspecified organism: Principal | ICD-10-CM | POA: Insufficient documentation

## 2010-09-13 DIAGNOSIS — G40909 Epilepsy, unspecified, not intractable, without status epilepticus: Secondary | ICD-10-CM | POA: Insufficient documentation

## 2010-09-13 DIAGNOSIS — G319 Degenerative disease of nervous system, unspecified: Secondary | ICD-10-CM

## 2010-09-13 DIAGNOSIS — F79 Unspecified intellectual disabilities: Secondary | ICD-10-CM | POA: Insufficient documentation

## 2010-09-13 DIAGNOSIS — F411 Generalized anxiety disorder: Secondary | ICD-10-CM | POA: Insufficient documentation

## 2010-09-13 LAB — URINALYSIS, ROUTINE W REFLEX MICROSCOPIC
Bilirubin Urine: NEGATIVE
Ketones, ur: NEGATIVE mg/dL
Nitrite: POSITIVE — AB
Protein, ur: 30 mg/dL — AB
Urobilinogen, UA: 1 mg/dL (ref 0.0–1.0)
pH: 6.5 (ref 5.0–8.0)

## 2010-09-13 LAB — BASIC METABOLIC PANEL
BUN: 18 mg/dL (ref 6–23)
Calcium: 9.5 mg/dL (ref 8.4–10.5)
Chloride: 96 mEq/L (ref 96–112)
Creatinine, Ser: 0.7 mg/dL (ref 0.50–1.10)
GFR calc Af Amer: >60 mL/min (ref >60)
GFR calc non Af Amer: >60 mL/min (ref >60)

## 2010-09-13 LAB — CBC
Hemoglobin: 11.6 g/dL — ABNORMAL LOW (ref 12.0–15.0)
MCH: 31.1 pg (ref 26.0–34.0)
Platelets: 225 10*3/uL (ref 150–400)
RBC: 3.73 MIL/uL — ABNORMAL LOW (ref 3.87–5.11)
WBC: 12.3 10*3/uL — ABNORMAL HIGH (ref 4.0–10.5)

## 2010-09-13 LAB — VALPROIC ACID LEVEL: Valproic Acid Lvl: 127.4 ug/mL — ABNORMAL HIGH (ref 50.0–100.0)

## 2010-09-13 LAB — DIFFERENTIAL
Basophils Relative: 0 % (ref 0–1)
Lymphs Abs: 1.2 10*3/uL (ref 0.7–4.0)
Monocytes Relative: 8 % (ref 3–12)
Neutro Abs: 10 10*3/uL — ABNORMAL HIGH (ref 1.7–7.7)
Neutrophils Relative %: 82 % — ABNORMAL HIGH (ref 43–77)

## 2010-09-13 LAB — URINE MICROSCOPIC-ADD ON

## 2010-09-14 LAB — COMPREHENSIVE METABOLIC PANEL
ALT: 5 U/L (ref 0–35)
Albumin: 2.7 g/dL — ABNORMAL LOW (ref 3.5–5.2)
Alkaline Phosphatase: 89 U/L (ref 39–117)
Calcium: 8.5 mg/dL (ref 8.4–10.5)
GFR calc Af Amer: >60 mL/min (ref >60)
Glucose, Bld: 91 mg/dL (ref 70–99)
Potassium: 3.9 mEq/L (ref 3.5–5.1)
Sodium: 133 mEq/L — ABNORMAL LOW (ref 135–145)
Total Protein: 6 g/dL (ref 6.0–8.3)

## 2010-09-14 LAB — CBC
Hemoglobin: 10.3 g/dL — ABNORMAL LOW (ref 12.0–15.0)
MCH: 31.6 pg (ref 26.0–34.0)
MCHC: 34.1 g/dL (ref 30.0–36.0)
Platelets: 205 10*3/uL (ref 150–400)
RDW: 14.6 % (ref 11.5–15.5)

## 2010-09-15 LAB — CBC
HCT: 30 % — ABNORMAL LOW (ref 36.0–46.0)
Hemoglobin: 10.1 g/dL — ABNORMAL LOW (ref 12.0–15.0)
MCH: 31.1 pg (ref 26.0–34.0)
RBC: 3.25 MIL/uL — ABNORMAL LOW (ref 3.87–5.11)

## 2010-09-15 LAB — BASIC METABOLIC PANEL
BUN: 7 mg/dL (ref 6–23)
CO2: 26 mEq/L (ref 19–32)
Calcium: 8.3 mg/dL — ABNORMAL LOW (ref 8.4–10.5)
Glucose, Bld: 101 mg/dL — ABNORMAL HIGH (ref 70–99)
Potassium: 3.6 mEq/L (ref 3.5–5.1)
Sodium: 135 mEq/L (ref 135–145)

## 2010-09-23 NOTE — Discharge Summary (Signed)
  NAMELEELOO, SILVERTHORNE           ACCOUNT NO.:  [id number]  MEDICAL RECORD NO.:  [id number]  LOCATION:  4709                         FACILITY:  [facility]  PHYSICIAN:  Flor Houdeshell, DO         DATE OF BIRTH:  1955/03/19  DATE OF ADMISSION:  09/13/2010 DATE OF DISCHARGE:  09/15/2010                              DISCHARGE SUMMARY   ADMISSION DIAGNOSES:  Altered mental status, tachycardia, fever, anxiety, torticollis, deep venous thrombosis, and urinary tract infection.  HISTORY OF PRESENT ILLNESS:  Please see H and P.  HOSPITAL COURSE:  The patient was admitted.  She was given IV Rocephin. She was given IV fluids.  Urinalysis was checked and was found to be positive for UTI.  Her Depakote level was found to be acceptable, it was 49.9, 50 is a low end of normal.  Considering her fever I would not change her Depakote dosing based on this.  The patient was continued on her medications. Yesterday, the patient did well overnight.  She did notsleep pretty much.  She is not sleeping today, however, her sister who was with her states that she was much improved when she was awake earlier in the day.  The patient's white count is down to 9.  She is afebrile.  I believe she is appropriate for discharge.  DISCHARGE DIAGNOSES: 1. Severe sepsis with tachycardia, fever, and altered mental status. 2. Urinary tract infection. 3. Generalized anxiety disorder. 4. Torticollis.  DISCHARGE INSTRUCTIONS:  ACTIVITY:  As tolerated.  The patient will be returned to her group home.  DISCHARGE MEDICATIONS: 1. Ferrous sulfate 2 p.o. daily. 2. Ranitidine 150 mg 1 p.o. nightly. 3. Propranolol 20 mg 1 p.o. t.i.d. 4. Enteric-coated aspirin 81 mg subcu daily. 5. Clomipramine 25 mg 4 p.o. nightly. 6. Clonazepam 0.5 mg 1 p.o. b.i.d. 7. Simvastatin 40 mg 1 p.o. nightly. 8. Hydrocortisone cream 0.2% apply topically to affected areas b.i.d. 9. Prenatal vitamins 1 p.o. daily. 10.Nexium 40 mg 1 p.o.  daily. 11.Zoloft 100 mg 1.5 p.o. daily. 12.Depakote 500 mg 1 p.o. t.i.d. 13.Baclofen 10 mg 0.5 one p.o. t.i.d. 14.Ceftin 500 mg 1 p.o. b.i.d.  FOLLOWUP:  The patient is to follow up with her primary care doctor who is Dr. Clarene Duke in 2-4 weeks.  DIET:  Cardiac.          ______________________________ Fran Lowes, DO     AS/MEDQ  D:  09/15/2010  T:  09/16/2010  Job:  [id number]  cc:   Caryn Bee L. Little, M.D.  Electronically Signed by Fran Lowes DO on 09/23/2010 01:38:55 PM

## 2010-10-26 NOTE — H&P (Signed)
  Sydney Hatfield, PARADISO NO.:  [id number]  MEDICAL RECORD NO.:  [id number]  LOCATION:  4709                         FACILITY:  [facility]  PHYSICIAN:  Massie Maroon, MD        DATE OF BIRTH:  Dec 13, 1955  DATE OF ADMISSION:  09/13/2010 DATE OF DISCHARGE:                             HISTORY & PHYSICAL   CHIEF COMPLAINT:  Altered mental status.  HISTORY OF PRESENT ILLNESS:  The patient is a 55 year old female with mental retardation, autism, was apparently in her group home and became confused today.  She presented to the ER and was found to have UTI.  She was also found to have supratherapeutic dose of Depakote.  A CT brain was done and showed no acute intracranial abnormality.  The patient will be admitted for altered mental status.  PAST MEDICAL HISTORY: 1. Anxiety. 2. Autism. 3. Mental retardation. 4. Seizure disorder. 5. Torticollis. 6. Hypertension. 7. GERD.  PAST SURGICAL HISTORY:  None.  SOCIAL HISTORY:  The patient lives in a group home.  She does not smoke, drink, or do drugs.  FAMILY HISTORY:  None per her sister.  ALLERGIES:  No known drug allergies.  MEDICATIONS:  Unknown.  REVIEW OF SYSTEMS:  Positive for fever in the ER, negative for all 10 review of systems except for pertinent positives stated above.  PHYSICAL EXAMINATION:  VITAL SIGNS:  Temperature 103.7, pulse 122, blood pressure 150/93, pulse ox is 99% on room air. HEENT:  Anicteric. NECK:  Slightly twisted to the left, no bruit. HEART:  Regular rate and rhythm.  S1, S2. LUNGS:  CTAB. ABDOMEN:  Soft, nontender. EXTREMITIES:  No cyanosis, clubbing, or edema. SKIN:  No rashes. LYMPH NODES:  No adenopathy. NEURO:  Nonfocal.  LABORATORY DATA:  Urinalysis, wbc's 3-6, rbc's 11-20.  I am not sure why the labs have not crossed over from MSTAT.  WBCs 12.3, hemoglobin 11.6, platelet count 225.  Sodium 134, potassium 4.1, BUN 18, creatinine 0.7. Depakote level 127.4.  CT brain negative for  any acute process.  Chest x- ray, no acute process.  ASSESSMENT/PLAN: 1. Altered mental status, likely secondary to urinary tract infection     and supratherapeutic Depakote level.  Hold Depakote tonight.  We     will check Depakote level in the a.m.  Treat urinary tract     infection with ceftriaxone 1 gram IV daily. 2. Tachycardia:  Continue propranolol.  Check a TSH.  If persistent,     check a cardiac 2-D echo.  Her tachycardia is most likely secondary     to fever. 3. Fever:  Likely secondary to urinary tract infection.  Check liver     function tests. 4. Anxiety.  Continue clonazepam. 5. Torticollis:  Continue baclofen. 6. Deep venous thrombosis prophylaxis:  SCDs.     Massie Maroon, MD     JYK/MEDQ  D:  09/14/2010  T:  09/14/2010  Job:  [id number]  cc:   Dr. Clarene Duke  Electronically Signed by Pearson Grippe MD on 10/26/2010 02:27:18 PM

## 2011-12-11 ENCOUNTER — Other Ambulatory Visit: Payer: Self-pay | Admitting: Family Medicine

## 2011-12-16 ENCOUNTER — Other Ambulatory Visit: Payer: Self-pay | Admitting: Family Medicine

## 2011-12-16 DIAGNOSIS — N644 Mastodynia: Secondary | ICD-10-CM

## 2012-01-02 ENCOUNTER — Ambulatory Visit
Admission: RE | Admit: 2012-01-02 | Discharge: 2012-01-02 | Disposition: A | Discharge status: Home / Self Care | Payer: Medicare Other | Source: Ambulatory Visit | Attending: Family Medicine | Admitting: Family Medicine

## 2012-01-02 DIAGNOSIS — N644 Mastodynia: Secondary | ICD-10-CM

## 2012-03-22 ENCOUNTER — Encounter (HOSPITAL_COMMUNITY): Payer: Self-pay | Admitting: Emergency Medicine

## 2012-03-22 ENCOUNTER — Observation Stay (HOSPITAL_BASED_OUTPATIENT_CLINIC_OR_DEPARTMENT_OTHER)
Admission: EM | Admit: 2012-03-22 | Discharge: 2012-03-23 | Disposition: A | Discharge status: Home / Self Care | Payer: Medicare Other | Source: Home / Self Care | Attending: Emergency Medicine | Admitting: Emergency Medicine

## 2012-03-22 ENCOUNTER — Emergency Department (HOSPITAL_COMMUNITY): Payer: Medicare Other

## 2012-03-22 DIAGNOSIS — M436 Torticollis: Secondary | ICD-10-CM | POA: Insufficient documentation

## 2012-03-22 DIAGNOSIS — F411 Generalized anxiety disorder: Secondary | ICD-10-CM | POA: Insufficient documentation

## 2012-03-22 DIAGNOSIS — N289 Disorder of kidney and ureter, unspecified: Secondary | ICD-10-CM | POA: Insufficient documentation

## 2012-03-22 DIAGNOSIS — F840 Autistic disorder: Secondary | ICD-10-CM | POA: Insufficient documentation

## 2012-03-22 DIAGNOSIS — G40909 Epilepsy, unspecified, not intractable, without status epilepticus: Secondary | ICD-10-CM | POA: Insufficient documentation

## 2012-03-22 DIAGNOSIS — F419 Anxiety disorder, unspecified: Secondary | ICD-10-CM

## 2012-03-22 DIAGNOSIS — I959 Hypotension, unspecified: Secondary | ICD-10-CM

## 2012-03-22 DIAGNOSIS — R569 Unspecified convulsions: Secondary | ICD-10-CM

## 2012-03-22 DIAGNOSIS — R4182 Altered mental status, unspecified: Secondary | ICD-10-CM

## 2012-03-22 HISTORY — DX: Moderate intellectual disabilities: F71

## 2012-03-22 HISTORY — DX: Anxiety disorder, unspecified: F41.9

## 2012-03-22 HISTORY — DX: Autistic disorder: F84.0

## 2012-03-22 HISTORY — DX: Torticollis: M43.6

## 2012-03-22 HISTORY — DX: Unspecified convulsions: R56.9

## 2012-03-22 LAB — CBC WITH DIFFERENTIAL/PLATELET
Basophils Relative: 0 % (ref 0–1)
Eosinophils Absolute: 0.1 10*3/uL (ref 0.0–0.7)
Eosinophils Relative: 1 % (ref 0–5)
Hemoglobin: 11.4 g/dL — ABNORMAL LOW (ref 12.0–15.0)
Lymphs Abs: 2.4 10*3/uL (ref 0.7–4.0)
MCH: 29.6 pg (ref 26.0–34.0)
MCHC: 33.4 g/dL (ref 30.0–36.0)
MCV: 88.6 fL (ref 78.0–100.0)
Monocytes Absolute: 0.8 10*3/uL (ref 0.1–1.0)
Monocytes Relative: 6 % (ref 3–12)
RBC: 3.85 MIL/uL — ABNORMAL LOW (ref 3.87–5.11)

## 2012-03-22 LAB — COMPREHENSIVE METABOLIC PANEL
Alkaline Phosphatase: 100 U/L (ref 39–117)
BUN: 19 mg/dL (ref 6–23)
Calcium: 9.6 mg/dL (ref 8.4–10.5)
Creatinine, Ser: 1.32 mg/dL — ABNORMAL HIGH (ref 0.50–1.10)
GFR calc Af Amer: 51 mL/min — ABNORMAL LOW (ref >90)
Glucose, Bld: 95 mg/dL (ref 70–99)
Total Protein: 7.4 g/dL (ref 6.0–8.3)

## 2012-03-22 LAB — RAPID URINE DRUG SCREEN, HOSP PERFORMED
Barbiturates: NOT DETECTED
Cocaine: NOT DETECTED

## 2012-03-22 LAB — LACTIC ACID, PLASMA: Lactic Acid, Venous: 1.5 mmol/L (ref 0.5–2.2)

## 2012-03-22 MED ORDER — SODIUM CHLORIDE 0.9 % IV SOLN
Freq: Once | INTRAVENOUS | Status: AC
  Administered 2012-03-22: via INTRAVENOUS

## 2012-03-22 MED ORDER — SODIUM CHLORIDE 0.9 % IV BOLUS (SEPSIS)
1000.0000 mL | Freq: Once | INTRAVENOUS | Status: AC
  Administered 2012-03-22: 1000 mL via INTRAVENOUS

## 2012-03-22 NOTE — ED Notes (Signed)
Pt has been been alert  and c/o head pain. Was has became increasingly weak from 8pm today. Hx aspiration pnuemoia and neck weakness.

## 2012-03-22 NOTE — ED Provider Notes (Signed)
History     CSN: [id number]  Arrival date & time 03/22/12  2222   First MD Initiated Contact with Patient 03/22/12 2259      Chief Complaint  Patient presents with  . Altered Mental Status    (Consider location/radiation/quality/duration/timing/severity/associated sxs/prior treatment) Patient is a 57 y.o. female presenting with altered mental status. The history is provided by a caregiver. The history is limited by the condition of the patient (Altered mental status).  Altered Mental Status  She was found in bed by caregivers with him being very somnolent and they decided to bring her to the ED. Earlier today, she had been at [employer] and had choked on a piece of pizza. Later in the day, she had gone to sleep and had been awakened and was confused upon a list of morning and ate some raisin bran. She then went back to bed and was found to be minimally responsive at the home, so she was transferred to the emergency department. The caregiver states that in the past, she has presented like this with episodes of hyponatremia. She does have history of torticollis and her head is always tilted to the left.  Past Medical History  Diagnosis Date  . Autistic spectrum disorder   . Anxiety disorder   . Seizure   . MR (mental retardation), moderate   . Torticollis     History reviewed. No pertinent past surgical history.  No family history on file.  History  Substance Use Topics  . Smoking status: Never Smoker   . Smokeless tobacco: Never Used  . Alcohol Use: No    OB History    Grav Para Term Preterm Abortions TAB SAB Ect Mult Living                  Review of Systems  Unable to perform ROS: Mental status change  Psychiatric/Behavioral: Positive for altered mental status.    Allergies  Review of patient's allergies indicates no known allergies.  Home Medications  No current outpatient prescriptions on file.  BP 95/61  Pulse 95  Resp 26  SpO2 95%  Physical Exam    Nursing note and vitals reviewed.  57 year old female, resting comfortably and in no acute distress. Vital signs are significant for hypotension with a blood pressure 80/60, and tachypnea with respiratory rate of 26. Oxygen saturation is 95%, which is normal. Head is normocephalic and atraumatic. PERRLA, EOMI. Oropharynx is clear. Neck is nontender, but head is tilted to the left. There is no adenopathy or JVD. Back is nontender and there is no CVA tenderness. Lungs are clear without rales, wheezes, or rhonchi. Chest is nontender. Heart has regular rate and rhythm without murmur. Abdomen is soft, flat, nontender without masses or hepatosplenomegaly and peristalsis is normoactive. Extremities have no cyanosis or edema, full range of motion is present. Skin is warm and dry without rash. Neurologic: She is sleepy but arousable and does follow commands and answer some questions appropriately, cranial nerves are grossly intact, there are no gross motor or sensory deficits.  ED Course  Procedures (including critical care time)  Results for orders placed during the hospital encounter of 03/22/12  CBC WITH DIFFERENTIAL      Component Value Range   WBC 12.2 (*) 4.0 - 10.5 K/uL   RBC 3.85 (*) 3.87 - 5.11 MIL/uL   Hemoglobin 11.4 (*) 12.0 - 15.0 g/dL   HCT 40.9 (*) 81.1 - 91.4 %   MCV 88.6  78.0 -  100.0 fL   MCH 29.6  26.0 - 34.0 pg   MCHC 33.4  30.0 - 36.0 g/dL   RDW 14.7 (*) 82.9 - 56.2 %   Platelets 189  150 - 400 K/uL   Neutrophils Relative 73  43 - 77 %   Neutro Abs 8.9 (*) 1.7 - 7.7 K/uL   Lymphocytes Relative 20  12 - 46 %   Lymphs Abs 2.4  0.7 - 4.0 K/uL   Monocytes Relative 6  3 - 12 %   Monocytes Absolute 0.8  0.1 - 1.0 K/uL   Eosinophils Relative 1  0 - 5 %   Eosinophils Absolute 0.1  0.0 - 0.7 K/uL   Basophils Relative 0  0 - 1 %   Basophils Absolute 0.0  0.0 - 0.1 K/uL  COMPREHENSIVE METABOLIC PANEL      Component Value Range   Sodium 131 (*) 135 - 145 mEq/L   Potassium  4.0  3.5 - 5.1 mEq/L   Chloride 94 (*) 96 - 112 mEq/L   CO2 24  19 - 32 mEq/L   Glucose, Bld 95  70 - 99 mg/dL   BUN 19  6 - 23 mg/dL   Creatinine, Ser 1.30 (*) 0.50 - 1.10 mg/dL   Calcium 9.6  8.4 - 86.5 mg/dL   Total Protein 7.4  6.0 - 8.3 g/dL   Albumin 3.4 (*) 3.5 - 5.2 g/dL   AST 17  0 - 37 U/L   ALT 10  0 - 35 U/L   Alkaline Phosphatase 100  39 - 117 U/L   Total Bilirubin 0.3  0.3 - 1.2 mg/dL   GFR calc non Af Amer 44 (*) >90 mL/min   GFR calc Af Amer 51 (*) >90 mL/min  LACTIC ACID, PLASMA      Component Value Range   Lactic Acid, Venous 1.5  0.5 - 2.2 mmol/L  URINALYSIS, ROUTINE W REFLEX MICROSCOPIC      Component Value Range   Color, Urine AMBER (*) YELLOW   APPearance CLOUDY (*) CLEAR   Specific Gravity, Urine 1.020  1.005 - 1.030   pH 6.0  5.0 - 8.0   Glucose, UA NEGATIVE  NEGATIVE mg/dL   Hgb urine dipstick SMALL (*) NEGATIVE   Bilirubin Urine SMALL (*) NEGATIVE   Ketones, ur TRACE (*) NEGATIVE mg/dL   Protein, ur NEGATIVE  NEGATIVE mg/dL   Urobilinogen, UA 0.2  0.0 - 1.0 mg/dL   Nitrite NEGATIVE  NEGATIVE   Leukocytes, UA SMALL (*) NEGATIVE  URINE RAPID DRUG SCREEN (HOSP PERFORMED)      Component Value Range   Opiates NONE DETECTED  NONE DETECTED   Cocaine NONE DETECTED  NONE DETECTED   Benzodiazepines NONE DETECTED  NONE DETECTED   Amphetamines POSITIVE (*) NONE DETECTED   Tetrahydrocannabinol NONE DETECTED  NONE DETECTED   Barbiturates NONE DETECTED  NONE DETECTED  URINE MICROSCOPIC-ADD ON      Component Value Range   WBC, UA 3-6  <3 WBC/hpf   RBC / HPF 3-6  <3 RBC/hpf   Casts HYALINE CASTS (*) NEGATIVE   Dg Chest Portable 1 View  03/22/2012  *RADIOLOGY REPORT*  Clinical Data: Altered mental status.  PORTABLE CHEST - 1 VIEW  Comparison: 03/04/2012  Findings: Coarse bibasilar interstitial markings, probably emphasized by low lung volumes.  No confluent airspace infiltrate. No effusion.  Heart size upper limits normal.  Regional bones unremarkable.   IMPRESSION:  1.  Low volumes.  No definite acute disease.  Original Report Authenticated By: D. Andria Rhein, MD       1. Altered mental status   2. Renal insufficiency    CRITICAL CARE Performed by: [email or web address],[email or web address]   Total critical care time: 45 minutes  Critical care time was exclusive of separately billable procedures and treating other patients.  Critical care was necessary to treat or prevent imminent or life-threatening deterioration.  Critical care was time spent personally by me on the following activities: development of treatment plan with patient and/or surrogate as well as nursing, discussions with consultants, evaluation of patient's response to treatment, examination of patient, obtaining history from patient or surrogate, ordering and performing treatments and interventions, ordering and review of laboratory studies, ordering and review of radiographic studies, pulse oximetry and re-evaluation of patient's condition.    MDM  Altered mental status with hypotension. Blood pressure responded well to intravenous fluid challenge. Laboratory workup has been initiated. Old records are reviewed and she has hospitalizations for urinary tract sepsis and sepsis from pneumonia.  Blood pressure has remained above 100. Workup is significant for slight rise in creatinine to 1.3, mild hyponatremia. There is no evidence of any infection to account for her altered mentation but she isn't aware of beer her baseline. She'll be sent for CT of head and she'll need to be admitted. Case is been discussed with Dr. Selena Batten of [facility] who agrees to admit the patient.        Dione Booze, MD 03/23/12 [phone]

## 2012-03-23 ENCOUNTER — Encounter (HOSPITAL_COMMUNITY): Payer: Self-pay | Admitting: Internal Medicine

## 2012-03-23 ENCOUNTER — Emergency Department (HOSPITAL_COMMUNITY): Payer: Medicare Other

## 2012-03-23 DIAGNOSIS — N289 Disorder of kidney and ureter, unspecified: Secondary | ICD-10-CM

## 2012-03-23 DIAGNOSIS — R569 Unspecified convulsions: Secondary | ICD-10-CM

## 2012-03-23 DIAGNOSIS — F419 Anxiety disorder, unspecified: Secondary | ICD-10-CM

## 2012-03-23 DIAGNOSIS — R4182 Altered mental status, unspecified: Secondary | ICD-10-CM

## 2012-03-23 DIAGNOSIS — I959 Hypotension, unspecified: Secondary | ICD-10-CM

## 2012-03-23 DIAGNOSIS — F411 Generalized anxiety disorder: Secondary | ICD-10-CM

## 2012-03-23 LAB — URINALYSIS, ROUTINE W REFLEX MICROSCOPIC
Nitrite: NEGATIVE
Specific Gravity, Urine: 1.02 (ref 1.005–1.030)
Urobilinogen, UA: 0.2 mg/dL (ref 0.0–1.0)

## 2012-03-23 LAB — URINE MICROSCOPIC-ADD ON

## 2012-03-23 NOTE — H&P (Signed)
Sydney Hatfield is an 57 y.o. female.   Chief Complaint: hypotension, ams HPI: 57 yo female with hx of choking at 1:30pm yesterday apparently had congestion, and caretaker was concerned about aspiration and so brought pt to ER for evaluation.  There was no mention of fever, chills, cp, palp, sob, n/v, abd pain, diarrhea, brbpr, black stool, seizure activity.  In the ER pt was found to be hypotensive ,  CXR was wnl.  Pt was apparently altered slightly and so CT scan brain is pending, ? Neck pain and so CT neck is pending as well.  Cardiac markers pending as well.  Pt will be admitted observation for hypotension, ? AMS.  Note that pt has been started on depakote in the past 4 months. Pt had depakote and benzo prior to arrival.  Not sure why urine drug screen + for amphetamines. Pt will be admitted for hypotension and ams and renal insufficiency as stated above.   Past Medical History  Diagnosis Date  . Autistic spectrum disorder   . Anxiety disorder   . Seizure   . MR (mental retardation), moderate   . Torticollis     Past Surgical History  Procedure Date  . Colonoscopy   . Breast biopsy     Family History  Problem Relation Age of Onset  . Hypertension Mother   . Hypertension Father   . CAD Father    Social History:  reports that she has never smoked. She has never used smokeless tobacco. She reports that she does not drink alcohol or use illicit drugs.  Allergies: No Known Allergies   (Not in a hospital admission)  Results for orders placed during the hospital encounter of 03/22/12 (from the past 48 hour(s))  CBC WITH DIFFERENTIAL     Status: Abnormal   Collection Time   03/22/12 11:00 PM      Component Value Range Comment   WBC 12.2 (*) 4.0 - 10.5 K/uL    RBC 3.85 (*) 3.87 - 5.11 MIL/uL    Hemoglobin 11.4 (*) 12.0 - 15.0 g/dL    HCT 40.9 (*) 81.1 - 46.0 %    MCV 88.6  78.0 - 100.0 fL    MCH 29.6  26.0 - 34.0 pg    MCHC 33.4  30.0 - 36.0 g/dL    RDW 91.4 (*) 78.2 - 15.5  %    Platelets 189  150 - 400 K/uL    Neutrophils Relative 73  43 - 77 %    Neutro Abs 8.9 (*) 1.7 - 7.7 K/uL    Lymphocytes Relative 20  12 - 46 %    Lymphs Abs 2.4  0.7 - 4.0 K/uL    Monocytes Relative 6  3 - 12 %    Monocytes Absolute 0.8  0.1 - 1.0 K/uL    Eosinophils Relative 1  0 - 5 %    Eosinophils Absolute 0.1  0.0 - 0.7 K/uL    Basophils Relative 0  0 - 1 %    Basophils Absolute 0.0  0.0 - 0.1 K/uL   COMPREHENSIVE METABOLIC PANEL     Status: Abnormal   Collection Time   03/22/12 11:00 PM      Component Value Range Comment   Sodium 131 (*) 135 - 145 mEq/L    Potassium 4.0  3.5 - 5.1 mEq/L    Chloride 94 (*) 96 - 112 mEq/L    CO2 24  19 - 32 mEq/L    Glucose, Bld 95  70 - 99 mg/dL    BUN 19  6 - 23 mg/dL    Creatinine, Ser 9.60 (*) 0.50 - 1.10 mg/dL    Calcium 9.6  8.4 - 45.4 mg/dL    Total Protein 7.4  6.0 - 8.3 g/dL    Albumin 3.4 (*) 3.5 - 5.2 g/dL    AST 17  0 - 37 U/L    ALT 10  0 - 35 U/L    Alkaline Phosphatase 100  39 - 117 U/L    Total Bilirubin 0.3  0.3 - 1.2 mg/dL    GFR calc non Af Amer 44 (*) >90 mL/min    GFR calc Af Amer 51 (*) >90 mL/min   LACTIC ACID, PLASMA     Status: Normal   Collection Time   03/22/12 11:00 PM      Component Value Range Comment   Lactic Acid, Venous 1.5  0.5 - 2.2 mmol/L   URINALYSIS, ROUTINE W REFLEX MICROSCOPIC     Status: Abnormal   Collection Time   03/22/12 11:23 PM      Component Value Range Comment   Color, Urine AMBER (*) YELLOW BIOCHEMICALS MAY BE AFFECTED BY COLOR   APPearance CLOUDY (*) CLEAR    Specific Gravity, Urine 1.020  1.005 - 1.030    pH 6.0  5.0 - 8.0    Glucose, UA NEGATIVE  NEGATIVE mg/dL    Hgb urine dipstick SMALL (*) NEGATIVE    Bilirubin Urine SMALL (*) NEGATIVE    Ketones, ur TRACE (*) NEGATIVE mg/dL    Protein, ur NEGATIVE  NEGATIVE mg/dL    Urobilinogen, UA 0.2  0.0 - 1.0 mg/dL    Nitrite NEGATIVE  NEGATIVE    Leukocytes, UA SMALL (*) NEGATIVE   URINE RAPID DRUG SCREEN (HOSP PERFORMED)      Status: Abnormal   Collection Time   03/22/12 11:23 PM      Component Value Range Comment   Opiates NONE DETECTED  NONE DETECTED    Cocaine NONE DETECTED  NONE DETECTED    Benzodiazepines NONE DETECTED  NONE DETECTED    Amphetamines POSITIVE (*) NONE DETECTED    Tetrahydrocannabinol NONE DETECTED  NONE DETECTED    Barbiturates NONE DETECTED  NONE DETECTED   URINE MICROSCOPIC-ADD ON     Status: Abnormal   Collection Time   03/22/12 11:23 PM      Component Value Range Comment   WBC, UA 3-6  <3 WBC/hpf    RBC / HPF 3-6  <3 RBC/hpf    Casts HYALINE CASTS (*) NEGATIVE    Dg Chest Portable 1 View  03/22/2012  *RADIOLOGY REPORT*  Clinical Data: Altered mental status.  PORTABLE CHEST - 1 VIEW  Comparison: 03/04/2012  Findings: Coarse bibasilar interstitial markings, probably emphasized by low lung volumes.  No confluent airspace infiltrate. No effusion.  Heart size upper limits normal.  Regional bones unremarkable.  IMPRESSION:  1.  Low volumes.  No definite acute disease.   Original Report Authenticated By: D. Andria Rhein, MD     Review of Systems  Constitutional: Negative.   HENT: Positive for neck pain.   Eyes: Negative.   Respiratory: Positive for cough. Negative for hemoptysis, sputum production, shortness of breath and wheezing.   Cardiovascular: Negative.   Gastrointestinal: Positive for heartburn. Negative for nausea, vomiting, abdominal pain, diarrhea, constipation, blood in stool and melena.  Genitourinary: Negative for dysuria, urgency, frequency, hematuria and flank pain.  Musculoskeletal: Negative for myalgias, back pain, joint pain  and falls.  Skin: Negative.   Neurological: Negative for dizziness, tingling, tremors, sensory change, speech change, focal weakness, seizures and loss of consciousness.  Endo/Heme/Allergies: Negative for environmental allergies and polydipsia. Does not bruise/bleed easily.  Psychiatric/Behavioral: Negative for depression, suicidal ideas,  hallucinations, memory loss and substance abuse. The patient is nervous/anxious. The patient does not have insomnia.     Blood pressure 124/76, pulse 95, resp. rate 18, SpO2 96.00%. Physical Exam  Constitutional: She is oriented to person, place, and time. She appears well-developed and well-nourished. No distress.  HENT:  Head: Normocephalic and atraumatic.  Mouth/Throat: No oropharyngeal exudate.  Eyes: Conjunctivae normal and EOM are normal. Pupils are equal, round, and reactive to light. Right eye exhibits no discharge. Left eye exhibits no discharge.  Neck: Normal range of motion. Neck supple. No JVD present. No tracheal deviation present. No thyromegaly present.  Cardiovascular: Normal rate, regular rhythm and normal heart sounds.  Exam reveals no gallop and no friction rub.   No murmur heard. Respiratory: Effort normal and breath sounds normal. No stridor. No respiratory distress. She has no wheezes. She has no rales. She exhibits no tenderness.  GI: Soft. Bowel sounds are normal. She exhibits no distension and no mass. There is no tenderness. There is no rebound and no guarding.  Musculoskeletal: Normal range of motion. She exhibits no edema and no tenderness.  Lymphadenopathy:    She has no cervical adenopathy.  Neurological: She is alert and oriented to person, place, and time. She has normal reflexes. She displays normal reflexes. No cranial nerve deficit. She exhibits normal muscle tone. Coordination normal.  Skin: Skin is warm and dry. No rash noted. She is not diaphoretic. No erythema. No pallor.  Psychiatric: She has a normal mood and affect. Her behavior is normal.     Assessment/Plan Hypotension: cycle cardiac markers, check echo, cortisol AMS: check CT brain Anxiety: cont zoloft Seizure do: check depakote level Hyponatremia: check serum osm, cortisol, tsh, urine sodium, urine osm Renal insufficiency ? dehdyration Neck pain/Torticolis: tramadol, CT neck  Capers Hagmann,  Ganon Demasi 03/23/2012, 1:23 AM

## 2012-03-23 NOTE — ED Notes (Signed)
Pt mother who is the POA was instructed that she is taking full responsibility of pt, understanding that pt decrease in health. Family of pt sts they will return if needed.

## 2012-03-23 NOTE — ED Notes (Signed)
Pt does not want to stay. Family wants Dr. Selena Batten to talk to her. Dr. Harrel Carina.

## 2012-03-26 ENCOUNTER — Encounter (HOSPITAL_COMMUNITY): Payer: Self-pay | Admitting: Emergency Medicine

## 2012-03-26 ENCOUNTER — Emergency Department (HOSPITAL_COMMUNITY): Payer: Medicare Other

## 2012-03-26 ENCOUNTER — Inpatient Hospital Stay (HOSPITAL_COMMUNITY)
Admission: EM | Admit: 2012-03-26 | Discharge: 2012-04-04 | DRG: 871 | Disposition: A | Discharge status: Home Health | Payer: Medicare Other | Attending: Internal Medicine | Admitting: Internal Medicine

## 2012-03-26 ENCOUNTER — Inpatient Hospital Stay (HOSPITAL_COMMUNITY): Payer: Medicare Other

## 2012-03-26 DIAGNOSIS — M436 Torticollis: Secondary | ICD-10-CM | POA: Diagnosis present

## 2012-03-26 DIAGNOSIS — E872 Acidosis, unspecified: Secondary | ICD-10-CM

## 2012-03-26 DIAGNOSIS — F419 Anxiety disorder, unspecified: Secondary | ICD-10-CM | POA: Diagnosis present

## 2012-03-26 DIAGNOSIS — R791 Abnormal coagulation profile: Secondary | ICD-10-CM | POA: Diagnosis present

## 2012-03-26 DIAGNOSIS — I959 Hypotension, unspecified: Secondary | ICD-10-CM

## 2012-03-26 DIAGNOSIS — F840 Autistic disorder: Secondary | ICD-10-CM | POA: Diagnosis present

## 2012-03-26 DIAGNOSIS — A419 Sepsis, unspecified organism: Principal | ICD-10-CM | POA: Diagnosis present

## 2012-03-26 DIAGNOSIS — J9600 Acute respiratory failure, unspecified whether with hypoxia or hypercapnia: Secondary | ICD-10-CM | POA: Diagnosis present

## 2012-03-26 DIAGNOSIS — G40909 Epilepsy, unspecified, not intractable, without status epilepticus: Secondary | ICD-10-CM | POA: Diagnosis present

## 2012-03-26 DIAGNOSIS — R7989 Other specified abnormal findings of blood chemistry: Secondary | ICD-10-CM

## 2012-03-26 DIAGNOSIS — F79 Unspecified intellectual disabilities: Secondary | ICD-10-CM

## 2012-03-26 DIAGNOSIS — E876 Hypokalemia: Secondary | ICD-10-CM | POA: Diagnosis not present

## 2012-03-26 DIAGNOSIS — R1310 Dysphagia, unspecified: Secondary | ICD-10-CM | POA: Diagnosis present

## 2012-03-26 DIAGNOSIS — D509 Iron deficiency anemia, unspecified: Secondary | ICD-10-CM | POA: Diagnosis present

## 2012-03-26 DIAGNOSIS — D72829 Elevated white blood cell count, unspecified: Secondary | ICD-10-CM

## 2012-03-26 DIAGNOSIS — R6889 Other general symptoms and signs: Secondary | ICD-10-CM

## 2012-03-26 DIAGNOSIS — R569 Unspecified convulsions: Secondary | ICD-10-CM | POA: Diagnosis present

## 2012-03-26 DIAGNOSIS — Z79899 Other long term (current) drug therapy: Secondary | ICD-10-CM

## 2012-03-26 DIAGNOSIS — F71 Moderate intellectual disabilities: Secondary | ICD-10-CM | POA: Diagnosis present

## 2012-03-26 DIAGNOSIS — R4182 Altered mental status, unspecified: Secondary | ICD-10-CM

## 2012-03-26 DIAGNOSIS — J690 Pneumonitis due to inhalation of food and vomit: Secondary | ICD-10-CM | POA: Diagnosis present

## 2012-03-26 LAB — INFLUENZA PANEL BY PCR (TYPE A & B): Influenza A By PCR: NEGATIVE

## 2012-03-26 LAB — COMPREHENSIVE METABOLIC PANEL
ALT: 9 U/L (ref 0–35)
AST: 29 U/L (ref 0–37)
CO2: 19 mEq/L (ref 19–32)
Calcium: 8.9 mg/dL (ref 8.4–10.5)
Chloride: 102 mEq/L (ref 96–112)
Creatinine, Ser: 1.47 mg/dL — ABNORMAL HIGH (ref 0.50–1.10)
GFR calc Af Amer: 45 mL/min — ABNORMAL LOW (ref >90)
GFR calc non Af Amer: 39 mL/min — ABNORMAL LOW (ref >90)
Glucose, Bld: 87 mg/dL (ref 70–99)
Total Bilirubin: 0.2 mg/dL — ABNORMAL LOW (ref 0.3–1.2)

## 2012-03-26 LAB — BLOOD GAS, ARTERIAL
Acid-base deficit: 1.3 mmol/L (ref 0.0–2.0)
Bicarbonate: 17.7 mEq/L — ABNORMAL LOW (ref 20.0–24.0)
Drawn by: 336861
O2 Content: 2 L/min
Patient temperature: 101.3
pCO2 arterial: 30.4 mmHg — ABNORMAL LOW (ref 35.0–45.0)
pCO2 arterial: 34 mmHg — ABNORMAL LOW (ref 35.0–45.0)
pH, Arterial: 7.345 — ABNORMAL LOW (ref 7.350–7.450)
pH, Arterial: 7.455 — ABNORMAL HIGH (ref 7.350–7.450)
pO2, Arterial: 72 mmHg — ABNORMAL LOW (ref 80.0–100.0)
pO2, Arterial: 93.5 mmHg (ref 80.0–100.0)

## 2012-03-26 LAB — CBC WITH DIFFERENTIAL/PLATELET
HCT: 32.2 % — ABNORMAL LOW (ref 36.0–46.0)
Hemoglobin: 10.5 g/dL — ABNORMAL LOW (ref 12.0–15.0)
Lymphocytes Relative: 17 % (ref 12–46)
Lymphs Abs: 0.7 10*3/uL (ref 0.7–4.0)
Monocytes Absolute: 0.2 10*3/uL (ref 0.1–1.0)
Monocytes Relative: 4 % (ref 3–12)
Neutro Abs: 3.5 10*3/uL (ref 1.7–7.7)
WBC: 4.5 10*3/uL (ref 4.0–10.5)

## 2012-03-26 LAB — EXPECTORATED SPUTUM ASSESSMENT W GRAM STAIN, RFLX TO RESP C: Special Requests: NORMAL

## 2012-03-26 LAB — GLUCOSE, CAPILLARY: Glucose-Capillary: 72 mg/dL (ref 70–99)

## 2012-03-26 LAB — MRSA PCR SCREENING: MRSA by PCR: NEGATIVE

## 2012-03-26 LAB — URINE MICROSCOPIC-ADD ON

## 2012-03-26 LAB — URINALYSIS, ROUTINE W REFLEX MICROSCOPIC
Bilirubin Urine: NEGATIVE
Nitrite: NEGATIVE
Protein, ur: NEGATIVE mg/dL
Specific Gravity, Urine: 1.029 (ref 1.005–1.030)
Urobilinogen, UA: 0.2 mg/dL (ref 0.0–1.0)

## 2012-03-26 LAB — APTT: aPTT: 26 seconds (ref 24–37)

## 2012-03-26 LAB — VALPROIC ACID LEVEL: Valproic Acid Lvl: 96.9 ug/mL (ref 50.0–100.0)

## 2012-03-26 LAB — PROCALCITONIN: Procalcitonin: 0.42 ng/mL

## 2012-03-26 MED ORDER — PIPERACILLIN-TAZOBACTAM 3.375 G IVPB
3.3750 g | Freq: Three times a day (TID) | INTRAVENOUS | Status: DC
  Administered 2012-03-26 – 2012-04-02 (×21): 3.375 g via INTRAVENOUS
  Filled 2012-03-26 (×23): qty 50

## 2012-03-26 MED ORDER — PIPERACILLIN-TAZOBACTAM 3.375 G IVPB
3.3750 g | Freq: Once | INTRAVENOUS | Status: AC
  Administered 2012-03-26: 3.375 g via INTRAVENOUS
  Filled 2012-03-26: qty 50

## 2012-03-26 MED ORDER — CLOMIPRAMINE HCL 25 MG PO CAPS
100.0000 mg | ORAL_CAPSULE | Freq: Every day | ORAL | Status: DC
  Administered 2012-03-26: 100 mg via ORAL
  Filled 2012-03-26 (×2): qty 4

## 2012-03-26 MED ORDER — VANCOMYCIN HCL 500 MG IV SOLR
500.0000 mg | Freq: Two times a day (BID) | INTRAVENOUS | Status: DC
  Administered 2012-03-26 – 2012-03-27 (×2): 500 mg via INTRAVENOUS
  Filled 2012-03-26 (×3): qty 500

## 2012-03-26 MED ORDER — CLONAZEPAM 0.5 MG PO TABS
0.5000 mg | ORAL_TABLET | Freq: Three times a day (TID) | ORAL | Status: DC
  Administered 2012-03-26 – 2012-04-04 (×20): 0.5 mg via ORAL
  Filled 2012-03-26 (×26): qty 1

## 2012-03-26 MED ORDER — DEXTROSE-NACL 5-0.9 % IV SOLN
INTRAVENOUS | Status: DC
  Administered 2012-03-26: 100 mL via INTRAVENOUS
  Administered 2012-03-27 – 2012-03-30 (×6): via INTRAVENOUS

## 2012-03-26 MED ORDER — CLONAZEPAM 0.5 MG PO TABS: 0.5000 mg | ORAL_TABLET | Freq: Three times a day (TID) | ORAL | Status: DC

## 2012-03-26 MED ORDER — ENOXAPARIN SODIUM 40 MG/0.4ML ~~LOC~~ SOLN
40.0000 mg | SUBCUTANEOUS | Status: DC
  Administered 2012-03-26 – 2012-04-04 (×10): 40 mg via SUBCUTANEOUS
  Filled 2012-03-26 (×10): qty 0.4

## 2012-03-26 MED ORDER — CLONAZEPAM 1 MG PO TABS
1.0000 mg | ORAL_TABLET | Freq: Every day | ORAL | Status: DC
  Administered 2012-03-26 – 2012-04-03 (×8): 1 mg via ORAL
  Filled 2012-03-26 (×6): qty 1

## 2012-03-26 MED ORDER — SODIUM CHLORIDE 0.9 % IV SOLN
INTRAVENOUS | Status: DC
  Administered 2012-03-26: 05:00:00 via INTRAVENOUS

## 2012-03-26 MED ORDER — DIVALPROEX SODIUM ER 500 MG PO TB24
1000.0000 mg | ORAL_TABLET | Freq: Every day | ORAL | Status: DC
  Administered 2012-03-26 – 2012-03-27 (×2): 1000 mg via ORAL
  Filled 2012-03-26 (×3): qty 2

## 2012-03-26 MED ORDER — SODIUM CHLORIDE 0.9 % IV BOLUS (SEPSIS)
1500.0000 mL | Freq: Once | INTRAVENOUS | Status: AC
  Administered 2012-03-26: 1500 mL via INTRAVENOUS

## 2012-03-26 MED ORDER — VANCOMYCIN HCL IN DEXTROSE 1-5 GM/200ML-% IV SOLN
1000.0000 mg | Freq: Once | INTRAVENOUS | Status: AC
  Administered 2012-03-26: 1000 mg via INTRAVENOUS
  Filled 2012-03-26: qty 200

## 2012-03-26 MED ORDER — ACETAMINOPHEN 650 MG RE SUPP
RECTAL | Status: AC
  Filled 2012-03-26: qty 1

## 2012-03-26 MED ORDER — SODIUM CHLORIDE 0.9 % IV SOLN
Freq: Once | INTRAVENOUS | Status: AC
  Administered 2012-03-26: 02:00:00 via INTRAVENOUS

## 2012-03-26 MED ORDER — PANTOPRAZOLE SODIUM 40 MG PO TBEC
40.0000 mg | DELAYED_RELEASE_TABLET | Freq: Every day | ORAL | Status: DC
  Administered 2012-03-26 – 2012-04-02 (×7): 40 mg via ORAL
  Filled 2012-03-26 (×9): qty 1

## 2012-03-26 MED ORDER — ACETAMINOPHEN 650 MG RE SUPP
RECTAL | Status: AC
  Administered 2012-03-26: 650 mg
  Filled 2012-03-26: qty 1

## 2012-03-26 MED ORDER — OSELTAMIVIR PHOSPHATE 75 MG PO CAPS
75.0000 mg | ORAL_CAPSULE | Freq: Once | ORAL | Status: DC
  Filled 2012-03-26: qty 1

## 2012-03-26 MED ORDER — ASPIRIN EC 81 MG PO TBEC
81.0000 mg | DELAYED_RELEASE_TABLET | Freq: Every day | ORAL | Status: DC
  Administered 2012-03-26 – 2012-04-02 (×7): 81 mg via ORAL
  Filled 2012-03-26 (×9): qty 1

## 2012-03-26 MED ORDER — ACETAMINOPHEN 650 MG RE SUPP
650.0000 mg | RECTAL | Status: DC | PRN
Start: 2012-03-26 — End: 2012-04-04
  Administered 2012-03-26 – 2012-03-28 (×6): 650 mg via RECTAL
  Filled 2012-03-26 (×6): qty 1

## 2012-03-26 MED ORDER — SERTRALINE HCL 100 MG PO TABS
200.0000 mg | ORAL_TABLET | Freq: Every day | ORAL | Status: DC
  Administered 2012-03-26 – 2012-04-04 (×9): 200 mg via ORAL
  Filled 2012-03-26 (×10): qty 2

## 2012-03-26 MED ORDER — LEVOFLOXACIN IN D5W 750 MG/150ML IV SOLN
750.0000 mg | INTRAVENOUS | Status: DC
  Administered 2012-03-26: 750 mg via INTRAVENOUS
  Filled 2012-03-26: qty 150

## 2012-03-26 MED ORDER — BIOTENE DRY MOUTH MT LIQD
15.0000 mL | Freq: Two times a day (BID) | OROMUCOSAL | Status: DC
  Administered 2012-03-26 – 2012-04-04 (×19): 15 mL via OROMUCOSAL

## 2012-03-26 NOTE — Progress Notes (Addendum)
[facility] PROGRESS NOTE  Sydney Hatfield ZOX:[id number] DOB: [birth year] DOA: 03/26/2012 PCP: Mickie Hillier, MD Brief Narrative: 57 year old female with moderate mental retardation, autism, cervical dystonia, history of seizure disorder who apparently choked while eating at the group home and was brought to the ED and initially planned for admission but signed out AMA in by her mother. She did well for one day but was noted to be very short of breath and coughing and febrile. She was started on Levaquin by her PCP the next day. In the ED patient was noted to be hypoxic , tachycardic and febrile and admitted to step down monitoring for sepsis secondary to acute respiratory failure with possible aspiration pneumonia  Assessment/Plan: Sepsis with acute respiratory failure likely secondary to aspiration pneumonia Patient admitted to step down for closer monitoring. She started on IV vancomycin and Zosyn for broader antibiotic coverage. I have added on Levaquin for atypical coverage. -Patient still febrile with tachycardia and tachypnea. She is maintaining O2 sat between 90-94% on 50% via Ventimask. She is also coughing up thick secretions and requires frequent suctioning. -Continue close to down monitoring. Continue broad-spectrum antibiotic. Tylenol when necessary for fever. Continue with scheduled nebs. Follow blood and sputum culture. Follow Legionella antigen. Rapid flu was negative so I will discontinue Tamiflu. -i will  repeat a chest x-ray PA lateral to evaluate pneumonia clinically. Admission chest x-ray did not clearly show any infiltrate.  Dysphagia with risk for aspiration -Patient seen by swallow eval. She is at high risk for aspiration. She's made n.p.o. currently and we will reassess her once her sepsis improved. Her place her on D5 NS for hydration. I have spoken with her neurologist Dr. Herminio Heads at [facility]. Patient is received no toxin injection 175 mg on  December 17 (4 weeks back) for her cervical dystonia. Her neurologist does not agree that aspiration is caused by effect of the botulinum toxin. Patient has received her third dose of Botulinum toxin this year and her neurologist feels the risk of  aspiration with botulinum toxin is not common. -Continue frequent suctioning with head of bed elevation. I have asked speech and swallow to reevaluate patient in next 2 days. -Will continue necessary home medications with apple sauce.  History of seizure disorder Depakote level is therapeutic. Continue current dose of Depakote.  History of mental retardation with agitation. Continue her home dose of Klonopin. Patient noted to be quite tachycardic likely in the setting of underlying sepsis and agitation. Continue Zoloft  Mental retardation Moderate and at baseline.  Chronic torticollis Patient has been taking Motrin if toxin injection every 3 months for past 9 months and follows with Dr. Herminio Heads at [facility]. Last injection received 1 months back. Discussed plan with  Iron Deficiency anemia Continue iron supplements  Code Status: full code Family Communication: mother at bedside Disposition Plan: Currently inpatient. Will get PT eval.    Consultants:  None. Plan discussed with patient's primary neurologist at [facility]  Procedures:  None  Antibiotics: IV vancomycin, Zosyn and Levaquin (1/17 >>)  HPI/Subjective: Admission H&P reviewed. Patient remains in respiratory distress and tachycardic. Also febrile to 103F. Has been coughing up secretions.  Objective: Filed Vitals:   03/26/12 0345 03/26/12 0800 03/26/12 1100 03/26/12 1200  BP: 110/54 138/64    Pulse: 103 116 140 137  Temp: 97.5 F (36.4 C) 100.6 F (38.1 C) 103.5 F (39.7 C) 102.3 F (39.1 C)  TempSrc: Core (Comment) Core (Comment)  Axillary  Resp: 30  32 35 36  Height: 5\' 2"  (1.575 m)     Weight: 66.5 kg (146 lb 9.7 oz)     SpO2: 98% 100% 96%  93%    Intake/Output Summary (Last 24 hours) at 03/26/12 1434 Last data filed at 03/26/12 1200  Gross per 24 hour  Intake 1012.5 ml  Output   2450 ml  Net -1437.5 ml   Filed Weights   03/26/12 0345  Weight: 66.5 kg (146 lb 9.7 oz)    Exam:   General:  Middle aged female lying in bed in some respiratory distress  HEENT: On Ventimask, no pallor, showing increased work of breathing  Cardiovascular: S1 and S2 tachycardic, no murmurs rub or gallop   Respiratory: Coarse breath sounds bilaterally, noted for thick secretions on suctioning.   Abdomen: Soft, nontender, nondistended, bowel sounds present  Extremities: Warm, no edema  CNS: Alert and awake and answering questions,  Data Reviewed: Basic Metabolic Panel:  Lab 03/26/12 1610 03/22/12 2300  NA 138 131*  K 4.4 4.0  CL 102 94*  CO2 19 24  GLUCOSE 87 95  BUN 24* 19  CREATININE 1.47* 1.32*  CALCIUM 8.9 9.6  MG -- --  PHOS -- --   Liver Function Tests:  Lab 03/26/12 0055 03/22/12 2300  AST 29 17  ALT 9 10  ALKPHOS 85 100  BILITOT 0.2* 0.3  PROT 6.7 7.4  ALBUMIN 2.7* 3.4*   No results found for this basename: LIPASE:5,AMYLASE:5 in the last 168 hours No results found for this basename: AMMONIA:5 in the last 168 hours CBC:  Lab 03/26/12 0540 03/22/12 2300  WBC 4.5 12.2*  NEUTROABS 3.5 8.9*  HGB 10.5* 11.4*  HCT 32.2* 34.1*  MCV 90.2 88.6  PLT 174 189   Cardiac Enzymes: No results found for this basename: CKTOTAL:5,CKMB:5,CKMBINDEX:5,TROPONINI:5 in the last 168 hours BNP (last 3 results) No results found for this basename: PROBNP:3 in the last 8760 hours CBG:  Lab 03/26/12 0857  GLUCAP 72    Recent Results (from the past 240 hour(s))  MRSA PCR SCREENING     Status: Normal   Collection Time   03/26/12  4:08 AM      Component Value Range Status Comment   MRSA by PCR NEGATIVE  NEGATIVE Final   CULTURE, EXPECTORATED SPUTUM-ASSESSMENT     Status: Normal   Collection Time   03/26/12  7:51 AM       Component Value Range Status Comment   Specimen Description SPUTUM   Final    Special Requests Normal   Final    Sputum evaluation     Final    Value: THIS SPECIMEN IS ACCEPTABLE. RESPIRATORY CULTURE REPORT TO FOLLOW.   Report Status 03/26/2012 FINAL   Final      Studies: Dg Chest 1 View  03/26/2012  *RADIOLOGY REPORT*  Clinical Data: Fever, respiratory distress.  CHEST - 1 VIEW  Comparison: 03/23/2012  Findings: Left retrocardiac linear scarring or atelectasis.  Low lung volumes with resultant crowding of bronchovascular structures. Suspect mild central pulmonary vascular congestion and some mild bibasilar interstitial edema or infiltrates.  No confluent airspace disease.  No effusion.  Heart size upper limits normal.  Regional bones unremarkable.  IMPRESSION:  Low volumes with suspicion of central pulmonary vascular congestion and mild bibasilar interstitial edema.   Original Report Authenticated By: D. Andria Rhein, MD     Scheduled Meds:   . acetaminophen      . antiseptic oral rinse  15 mL Mouth  Rinse BID  . aspirin EC  81 mg Oral Daily  . clomiPRAMINE  100 mg Oral QHS  . clonazePAM  0.5 mg Oral TID  . clonazePAM  1 mg Oral Daily  . divalproex  1,000 mg Oral QHS  . enoxaparin (LOVENOX) injection  40 mg Subcutaneous Q24H  . levofloxacin (LEVAQUIN) IV  750 mg Intravenous Q48H  . oseltamivir  75 mg Oral Once  . pantoprazole  40 mg Oral Daily  . piperacillin-tazobactam (ZOSYN)  IV  3.375 g Intravenous Q8H  . sertraline  200 mg Oral Daily  . vancomycin  500 mg Intravenous Q12H   Continuous Infusions:   . sodium chloride 100 mL (03/26/12 0915)     Time spent: 30 minutes    Sydney Hatfield  [facility] Pager [phone]. If 8PM-8AM, please contact night-coverage at [email or web address], password [email or web address] 03/26/2012, 2:34 PM  LOS: 0 days

## 2012-03-26 NOTE — H&P (Signed)
PCP:   Mickie Hillier, MD   Chief Complaint:  sob  HPI: 57 yo female h/o moderate mental retardation, autism, seizure disorder 2 days ago at her group home choked while eating some stringy cheese someone had to do the hemlick maneuver on her which was successful.  She was brought to the ED and evaluated and was going to be admitted but her mother signed her out AMA back to the group home.  Mom is present right now and says she was doing well yesterday and then today the staff found her very sob and coughing a lot and she started running fever today.  There has been no report of n/v/d.  She did f/u with her pcp yesterday for the ED visit and was started on levaquin yesterday.  She has not been complaining of anything and is currenlty requiring NRB mask.  She denies any pain.  Mom at bedside and is appropriate.  On arrival sbp 80's oxygen sats also in 80's on RA, tachypneic and febrile.  Review of Systems:  O/w unobtainable from pt  Past Medical History: Past Medical History  Diagnosis Date  . Autistic spectrum disorder   . Anxiety disorder   . Seizure   . MR (mental retardation), moderate   . Torticollis    Past Surgical History  Procedure Date  . Colonoscopy   . Breast biopsy     Medications: Prior to Admission medications   Medication Sig Start Date End Date Taking? Authorizing Provider  aspirin EC 81 MG tablet Take 81 mg by mouth daily.   Yes Historical Provider, MD  clomiPRAMINE (ANAFRANIL) 50 MG capsule Take 100 mg by mouth at bedtime.   Yes Historical Provider, MD  clonazePAM (KLONOPIN) 0.5 MG tablet Take 0.5-1 mg by mouth 2 (two) times daily as needed. For anxiety   Yes Historical Provider, MD  clotrimazole-betamethasone (LOTRISONE) cream Apply 1 application topically 2 (two) times daily. Apply to face   Yes Historical Provider, MD  divalproex (DEPAKOTE ER) 500 MG 24 hr tablet Take 1,000 mg by mouth at bedtime.   Yes Historical Provider, MD  esomeprazole (NEXIUM) 40 MG  capsule Take 40 mg by mouth daily before breakfast.   Yes Historical Provider, MD  ferrous sulfate 325 (65 FE) MG tablet Take 650 mg by mouth daily with breakfast.   Yes Historical Provider, MD  hydrocortisone 2.5 % cream Apply 1 application topically 2 (two) times daily as needed.   Yes Historical Provider, MD  nitrofurantoin (MACRODANTIN) 50 MG capsule Take 50 mg by mouth every evening.    Yes Historical Provider, MD  ranitidine (ZANTAC) 150 MG tablet Take 150 mg by mouth every evening.   Yes Historical Provider, MD  sertraline (ZOLOFT) 100 MG tablet Take 200 mg by mouth daily.   Yes Historical Provider, MD  simvastatin (ZOCOR) 40 MG tablet Take 40 mg by mouth every evening.   Yes Historical Provider, MD    Allergies:   Allergies  Allergen Reactions  . Carbamazepine Other (See Comments)    hyponatremia  . Sulfa Antibiotics Other (See Comments)    Unknown reaction  . Tessalon (Benzonatate) Other (See Comments)    dizziness    Social History:  reports that she has never smoked. She has never used smokeless tobacco. She reports that she does not drink alcohol or use illicit drugs.  Family History: Family History  Problem Relation Age of Onset  . Hypertension Mother   . Hypertension Father   . CAD Father  Physical Exam: Filed Vitals:   03/26/12 0025 03/26/12 0027 03/26/12 0152  BP: 80/40  109/57  Pulse: 108  103  Temp: 101.3 F (38.5 C)    TempSrc: Rectal    Resp: 22  20  SpO2: 84% 89% 93%   General appearance: alert and moderate distress Neck: no JVD and supple, symmetrical, trachea midline Lungs: diminished breath sounds bilaterally tachypneic no w/r/r Heart: regular rate and rhythm, S1, S2 normal, no murmur, click, rub or gallop Abdomen: soft, non-tender; bowel sounds normal; no masses,  no organomegaly Extremities: extremities normal, atraumatic, no cyanosis or edema Pulses: 2+ and symmetric Skin: Skin color, texture, turgor normal. No rashes or  lesions Neurologic: Grossly normal    Labs on Admission:   [facility] 03/26/12 0055  NA 138  K 4.4  CL 102  CO2 19  GLUCOSE 87  BUN 24*  CREATININE 1.47*  CALCIUM 8.9  MG --  PHOS --    Basename 03/26/12 0055  AST 29  ALT 9  ALKPHOS 85  BILITOT 0.2*  PROT 6.7  ALBUMIN 2.7*    Radiological Exams on Admission: Dg Chest 1 View  03/26/2012  *RADIOLOGY REPORT*  Clinical Data: Fever, respiratory distress.  CHEST - 1 VIEW  Comparison: 03/23/2012  Findings: Left retrocardiac linear scarring or atelectasis.  Low lung volumes with resultant crowding of bronchovascular structures. Suspect mild central pulmonary vascular congestion and some mild bibasilar interstitial edema or infiltrates.  No confluent airspace disease.  No effusion.  Heart size upper limits normal.  Regional bones unremarkable.  IMPRESSION:  Low volumes with suspicion of central pulmonary vascular congestion and mild bibasilar interstitial edema.   Original Report Authenticated By: D. Andria Rhein, MD     Assessment/Plan 57 yo female with acute resp failure and likely sepsis from respiratory source aspiration pna vs influenza  Principal Problem:  *Sepsis Active Problems:  Seizure  Acute respiratory failure  Autistic spectrum disorder  Anxiety disorder  MR (mental retardation), moderate  Aspiration pneumonia  Ck quick flu.  Place on vanc/zosyn/tamiflu.  sbp has responded to iv fluid boluses in ED and is above 100 now.  cxr not very revealing.  ua neg.  No skin infections.  Stepdown unit, aggressive ivf.  Broad abx.  sput and blood cultures.  The reason why mom signed her out AMA when she choked was because she felt she was back to baseline and the last time pt was admitted here at [facility] all of her psych meds were stopped which pt did not tolerate well.  So mom is very concerned about this happening again.  i have continued her psych meds and checked a depakote level.  Need to reck her home meds in the am to make  sure they are correct.  Full code.  Noelie Renfrow A 03/26/2012, 2:32 AM

## 2012-03-26 NOTE — Progress Notes (Signed)
INITIAL NUTRITION ASSESSMENT  DOCUMENTATION CODES Per approved criteria  -Not Applicable   INTERVENTION: 1.  Modify diet; per MD/SLP discretion 2.  Nutrition support; if appropriate based on diet progression.  NUTRITION DIAGNOSIS: Inadequate oral intake related to dysphagia as evidenced by pt NPO for safety.   Monitor:  1.  Food/Beverage; diet advancement as medically appropriate.  Reason for Assessment: MST  57 y.o. female  Admitting Dx: Sepsis  ASSESSMENT: Pt admitted with aspiration pneumonia s/p episode of choking. Caregivers at bedside state that pt has had one episode of choking in the past and was evaluated for swallowing ability at that time (several years ago).  Caregivers report pt has poor diet quality.  She eats variably- often skips dinner and typically eats light for lunch on the weekends.  Pt with poor intake of fluids at baseline.  She typically takes small sips for meds and will keep fluids on-hand, but rarely drink them.  Dietary recall: Weekdays Breakfast:  Oatmeal, 8 oz orange juice, 10 oz coffee (decaf) Lunch:  Fast food meal of choice Dinner:  Often skipped, sometimes bowl of cereal  Weekends: Breakfast: Egg and sausage/bacon, sometimes with pancake Lunch: Slim fast Dinner: small portion of meal with family, loves steak   Height: Ht Readings from Last 1 Encounters:  03/26/12 5\' 2"  (1.575 m)    Weight: Wt Readings from Last 1 Encounters:  03/26/12 146 lb 9.7 oz (66.5 kg)    Ideal Body Weight: 110 lbs  % Ideal Body Weight: 132%  Wt Readings from Last 10 Encounters:  03/26/12 146 lb 9.7 oz (66.5 kg)    Usual Body Weight: variable, recently gained a lot of weight per caregiver up to 150 lbs.  Now at 146 lbs  % Usual Body Weight: 100%  BMI:  Body mass index is 26.81 kg/(m^2).  Estimated Nutritional Needs: Kcal: 1500-1650 Protein: 60-75g Fluid: >1.5 L/day  Skin: wnl  Diet Order: NPO  EDUCATION NEEDS: -Education needs  addressed   Intake/Output Summary (Last 24 hours) at 03/26/12 1324 Last data filed at 03/26/12 1200  Gross per 24 hour  Intake 1012.5 ml  Output   2450 ml  Net -1437.5 ml    Last BM: PTA  Labs:   Lab 03/26/12 0055 03/22/12 2300  NA 138 131*  K 4.4 4.0  CL 102 94*  CO2 19 24  BUN 24* 19  CREATININE 1.47* 1.32*  CALCIUM 8.9 9.6  MG -- --  PHOS -- --  GLUCOSE 87 95    CBG (last 3)   Basename 03/26/12 0857  GLUCAP 72    Scheduled Meds:   . acetaminophen      . antiseptic oral rinse  15 mL Mouth Rinse BID  . aspirin EC  81 mg Oral Daily  . clomiPRAMINE  100 mg Oral QHS  . clonazePAM  0.5 mg Oral TID  . clonazePAM  1 mg Oral Daily  . divalproex  1,000 mg Oral QHS  . enoxaparin (LOVENOX) injection  40 mg Subcutaneous Q24H  . levofloxacin (LEVAQUIN) IV  750 mg Intravenous Q48H  . oseltamivir  75 mg Oral Once  . pantoprazole  40 mg Oral Daily  . piperacillin-tazobactam (ZOSYN)  IV  3.375 g Intravenous Q8H  . sertraline  200 mg Oral Daily  . vancomycin  500 mg Intravenous Q12H    Continuous Infusions:   . sodium chloride 100 mL (03/26/12 0915)    Past Medical History  Diagnosis Date  . Autistic spectrum disorder   .  Anxiety disorder   . Seizure   . MR (mental retardation), moderate   . Torticollis     Past Surgical History  Procedure Date  . Colonoscopy   . Breast biopsy     Loyce Dys, MS RD LDN Clinical Inpatient Dietitian Pager: [phone] Weekend/After hours pager: [phone]

## 2012-03-26 NOTE — Evaluation (Signed)
Clinical/Bedside Swallow Evaluation Patient Details  Name: Sydney Hatfield MRN: [id number] Date of Birth: November 28, 1955  Today's Date: 03/26/2012 Time: 4098-1191 SLP Time Calculation (min): 46 min  Past Medical History:  Past Medical History  Diagnosis Date  . Autistic spectrum disorder   . Anxiety disorder   . Seizure   . MR (mental retardation), moderate   . Torticollis    Past Surgical History:  Past Surgical History  Procedure Date  . Colonoscopy   . Breast biopsy    HPI:  57 yo female h/o moderate mental retardation, autism, seizure disorder 2 days ago at her group home choked while eating some stringy cheese someone had to do the hemlick maneuver on her which was successful.  She was brought to the ED and evaluated and was going to be admitted but her mother signed her out AMA back to the group home.  Mom is present right now and says she was doing well yesterday and then today the staff found her very sob and coughing a lot and she started running fever today.  There has been no report of n/v/d.  She did f/u with her pcp yesterday for the ED visit and was started on levaquin yesterday.  She has not been complaining of anything and is currenlty requiring NRB mask.  She denies any pain.  Mom at bedside and is appropriate.  On arrival sbp 80's oxygen sats also in 80's on RA, tachypneic and febrile.  Patient also has spasmodic torticollis, receiving botox injections every 3 months.   Assessment / Plan / Recommendation Clinical Impression  This patient has a history of spasmodic torticollis, and receives botox injections every three months to reduce the tone in her neck.  This SLP spoke with the RN from [facility], Debbra Riding, who is familiar with the patient's ongoing botox treatments.  Ms. Harlon Flor reports the last injection was on 02/24/12, with 175 units given.  She also reports that the botox is at the hght of effectiveness right now, and that it will take several weeks to one  month for patient to improve from her current status.  Currently, patient appears to be aspirating puree as well as honey, nectar thick and thin liquids.  Patient's respiratory rate increased to 40 BPM, Sats decreased to 92%, and HR was up to 130.  The mother and caregiver report patient becomes very agitated even with low grade fevers, and that her behavior must be medically managed ("she must be able to take her meds").  Patient is unable to cooperate with MBS at this time.  Family requests a Neurology consult.    Aspiration Risk  Severe    Diet Recommendation NPO except meds   Medication Administration: Via alternative means (If possible) Postural Changes and/or Swallow Maneuvers: Seated upright 90 degrees    Other  Recommendations Oral Care Recommendations: Oral care before and after PO;Oral care QID   Follow Up Recommendations       Frequency and Duration        Pertinent Vitals/Pain Unable to state    SLP Swallow Goals     Swallow Study Prior Functional Status       General Date of Onset: 03/15/12 HPI: 57 yo female h/o moderate mental retardation, autism, seizure disorder 2 days ago at her group home choked while eating some stringy cheese someone had to do the hemlick maneuver on her which was successful.  She was brought to the ED and evaluated and was going to be admitted  but her mother signed her out AMA back to the group home.  Mom is present right now and says she was doing well yesterday and then today the staff found her very sob and coughing a lot and she started running fever today.  There has been no report of n/v/d.  She did f/u with her pcp yesterday for the ED visit and was started on levaquin yesterday.  She has not been complaining of anything and is currenlty requiring NRB mask.  She denies any pain.  Mom at bedside and is appropriate.  On arrival sbp 80's oxygen sats also in 80's on RA, tachypneic and febrile.  Patient also has spasmodic torticollis, receiving  botox injections every 3 months. Type of Study: Bedside swallow evaluation Previous Swallow Assessment: Mother reports patient has had a MBS several years ago, but not in the [facility]. Diet Prior to this Study: Thin liquids Temperature Spikes Noted: Yes Respiratory Status: Supplemental O2 delivered via (comment) (NRB mask 50%) History of Recent Intubation: No Behavior/Cognition: Alert;Agitated;Uncooperative;Distractible;Requires cueing;Doesn't follow directions;Decreased sustained attention Oral Cavity - Dentition: Adequate natural dentition Self-Feeding Abilities: Total assist Patient Positioning: Upright in bed Baseline Vocal Quality: Hoarse;Low vocal intensity Volitional Cough: Cognitively unable to elicit Volitional Swallow: Unable to elicit    Oral/Motor/Sensory Function Overall Oral Motor/Sensory Function: Appears within functional limits for tasks assessed   Ice Chips     Thin Liquid Thin Liquid: Impaired Presentation: Spoon Pharyngeal  Phase Impairments: Throat Clearing - Immediate;Cough - Delayed;Change in Vital Signs    Nectar Thick Presentation: Spoon Pharyngeal Phase Impairments: Multiple swallows;Throat Clearing - Immediate;Cough - Delayed;Change in Vital Signs   Honey Thick Honey Thick Liquid: Impaired Pharyngeal Phase Impairments: Cough - Delayed;Change in Vital Signs   Puree Puree: Impaired Presentation: Spoon Pharyngeal Phase Impairments: Cough - Delayed;Change in Vital Signs   Solid   GO    Solid: Not tested       [name] T 03/26/2012,11:31 AM

## 2012-03-26 NOTE — Progress Notes (Signed)
[id number]/Roniyah Llorens, RN, BSN, CCM:  CHART REVIEWED AND UPDATED.  Next chart review due on 01202014. NO DISCHARGE NEEDS PRESENT AT THIS TIME. CASE MANAGEMENT [phone] 

## 2012-03-26 NOTE — ED Provider Notes (Signed)
History     CSN: [id number]  Arrival date & time 03/26/12  0015   First MD Initiated Contact with Patient 03/26/12 0032      Chief Complaint  Patient presents with  . Aspiration    (Consider location/radiation/quality/duration/timing/severity/associated sxs/prior treatment) HPI Comments: Sydney Hatfield presents via EMS from a local facility.  She has a hx of autism/MR and was seen here in the ER on 1/13 after a choking episode.  She returned to her baseline prior to leaving the ER.  Tonight she was found appearing anxious and short of breath.  EMS noted her blood pressure to be low.  There was no witnessed choking or aspiration tonight.  She has had no vomiting or diarrhea.  The history is provided by a caregiver and the EMS personnel. The history is limited by a developmental delay.    Past Medical History  Diagnosis Date  . Autistic spectrum disorder   . Anxiety disorder   . Seizure   . MR (mental retardation), moderate   . Torticollis     Past Surgical History  Procedure Date  . Colonoscopy   . Breast biopsy     Family History  Problem Relation Age of Onset  . Hypertension Mother   . Hypertension Father   . CAD Father     History  Substance Use Topics  . Smoking status: Never Smoker   . Smokeless tobacco: Never Used  . Alcohol Use: No    OB History    Grav Para Term Preterm Abortions TAB SAB Ect Mult Living                  Review of Systems  Unable to perform ROS: Other    Allergies  Review of patient's allergies indicates no known allergies.  Home Medications   Current Outpatient Rx  Name  Route  Sig  Dispense  Refill  . ASPIRIN EC 81 MG PO TBEC   Oral   Take 81 mg by mouth daily.         Marland Kitchen BENZONATATE 100 MG PO CAPS   Oral   Take 100 mg by mouth 3 (three) times daily.         Marland Kitchen CLOMIPRAMINE HCL 50 MG PO CAPS   Oral   Take 100 mg by mouth at bedtime.         Marland Kitchen CLONAZEPAM 0.5 MG PO TABS   Oral   Take 0.5-1 mg by mouth 4 (four)  times daily. 1 tablet in morning and at noon. 2 tablets at 4pm and 8pm.         . DIVALPROEX SODIUM ER 500 MG PO TB24   Oral   Take 1,000 mg by mouth at bedtime.         Marland Kitchen ESOMEPRAZOLE MAGNESIUM 40 MG PO CPDR   Oral   Take 40 mg by mouth daily before breakfast.         . FERROUS SULFATE 325 (65 FE) MG PO TABS   Oral   Take 650 mg by mouth daily with breakfast.         . NITROFURANTOIN MACROCRYSTAL 50 MG PO CAPS   Oral   Take 50 mg by mouth 4 (four) times daily.         Marland Kitchen RANITIDINE HCL 150 MG PO TABS   Oral   Take 150 mg by mouth every evening.         Marland Kitchen SERTRALINE HCL 100 MG PO TABS  Oral   Take 200 mg by mouth daily.         Marland Kitchen SIMVASTATIN 40 MG PO TABS   Oral   Take 40 mg by mouth every evening.           BP 80/40  Pulse 108  Temp 101.3 F (38.5 C) (Rectal)  Resp 22  SpO2 89%  Physical Exam  Nursing note and vitals reviewed. Constitutional: She is cooperative. She appears toxic. She appears ill. No distress. She is not intubated.  HENT:  Head: Normocephalic and atraumatic. Head is without raccoon's eyes, without Battle's sign, without contusion, without laceration, without right periorbital erythema and without left periorbital erythema. No trismus in the jaw.  Right Ear: External ear normal.  Left Ear: External ear normal.  Nose: Nose normal. No rhinorrhea. No epistaxis. Right sinus exhibits no maxillary sinus tenderness and no frontal sinus tenderness. Left sinus exhibits no maxillary sinus tenderness and no frontal sinus tenderness.  Mouth/Throat: Uvula is midline and oropharynx is clear and moist. Mucous membranes are not pale, dry and not cyanotic. No uvula swelling. No oropharyngeal exudate.  Eyes: Conjunctivae normal are normal. Pupils are equal, round, and reactive to light. Right eye exhibits no discharge. Left eye exhibits no discharge. No scleral icterus.  Neck: Normal range of motion. Neck supple. No JVD present. No tracheal deviation  present.       Neck turned to the left.  No nuchal rigidity.  Cardiovascular: S1 normal, S2 normal, normal heart sounds, intact distal pulses and normal pulses.   No extrasystoles are present. Tachycardia present.  PMI is not displaced.  Exam reveals no gallop and no decreased pulses.   No murmur heard. Pulmonary/Chest: No accessory muscle usage or stridor. Tachypnea noted. No apnea and not bradypneic. She is not intubated. No respiratory distress. She has decreased breath sounds (bibasilar). She has no wheezes. She has rhonchi. She has rales.  Abdominal: Soft. Bowel sounds are normal. She exhibits no distension and no mass. There is no tenderness. There is no rigidity, no rebound, no guarding, no CVA tenderness, no tenderness at McBurney's point and negative Murphy's sign. No hernia.  Musculoskeletal: Normal range of motion. She exhibits no edema and no tenderness.  Lymphadenopathy:    She has no cervical adenopathy.  Neurological: She is alert.  Skin: Skin is warm and dry. No rash noted. She is not diaphoretic. No erythema. No pallor.  Psychiatric: She has a normal mood and affect. Her behavior is normal.    ED Course  Procedures (including critical care time)  Labs Reviewed  BLOOD GAS, ARTERIAL - Abnormal; Notable for the following:    pH, Arterial 7.345 (*)     pCO2 arterial 34.0 (*)     Bicarbonate 17.7 (*)     Acid-base deficit 6.3 (*)     All other components within normal limits  CULTURE, BLOOD (ROUTINE X 2)  CULTURE, BLOOD (ROUTINE X 2)  CBC WITH DIFFERENTIAL  COMPREHENSIVE METABOLIC PANEL  URINALYSIS, ROUTINE W REFLEX MICROSCOPIC  URINE CULTURE  PROTIME-INR  APTT  LACTIC ACID, PLASMA  VALPROIC ACID LEVEL   Dg Chest 1 View  03/26/2012  *RADIOLOGY REPORT*  Clinical Data: Fever, respiratory distress.  CHEST - 1 VIEW  Comparison: 03/23/2012  Findings: Left retrocardiac linear scarring or atelectasis.  Low lung volumes with resultant crowding of bronchovascular structures.  Suspect mild central pulmonary vascular congestion and some mild bibasilar interstitial edema or infiltrates.  No confluent airspace disease.  No effusion.  Heart  size upper limits normal.  Regional bones unremarkable.  IMPRESSION:  Low volumes with suspicion of central pulmonary vascular congestion and mild bibasilar interstitial edema.   Original Report Authenticated By: D. Andria Rhein, MD      No diagnosis found.   Date: 03/26/2012  Rate: 114 bpm  Rhythm: sinus tachycardia  QRS Axis: normal  Intervals: normal  ST/T Wave abnormalities: nonspecific ST changes  Conduction Disutrbances: borderline repol abnormality  Narrative Interpretation:   Old EKG Reviewed: unchanged      MDM  Pt presents for evaluation of suspected aspiration.  She appears acutely ill, note fever, elevated HR and RR, depressed O2 sat, and hypotension.  She was evaluated and admitted to the hospital on 1/13 after choking while eating.  She also had some mental status changes which resolved prior to her leaving the hospital.  There was no witnessed choking episode tonight.  Will obtain an ABG, basic labs, coags, lactic acid, blood and urine cultures, and CXR.  Will bolus IVF and administer vancomycin and zosyn for treatment of sepsis.  Anticipate hospital admission either an ICU or step-down unit.  Will reassess.  0240.  Pt's vital signs have improved.  ABG demonstrates a metabolic acidemia.  Although some edema is noted on the CXR, no infiltrate is identified.  The urinalysis is also negative for evidence of a UTI.  She has no obvious flu-like symptoms.  Discussed her evaluation with Dr. Onalee Hua (hospitalist).  She will be admitted to a step-down unit.  CRITICAL CARE Performed by: Dana Allan T   Total critical care time: 45  Critical care time was exclusive of separately billable procedures and treating other patients.  Critical care was necessary to treat or prevent imminent or life-threatening  deterioration.  Critical care was time spent personally by me on the following activities: development of treatment plan with patient and/or surrogate as well as nursing, discussions with consultants, evaluation of patient's response to treatment, examination of patient, obtaining history from patient or surrogate, ordering and performing treatments and interventions, ordering and review of laboratory studies, ordering and review of radiographic studies, pulse oximetry and re-evaluation of patient's condition.        Tobin Chad, MD 03/26/12 [phone]

## 2012-03-26 NOTE — ED Notes (Signed)
[id number]<JY> Expected date:<BR> Expected time:<BR> Means of arrival:<BR> Comments:<BR> EMS/female/aspiration pneumonia-hx MR/autism

## 2012-03-26 NOTE — Progress Notes (Signed)
Pharmacy Consult - Antibiotic Renal Dose Adjustment    56 yof on D1 Vanc, Zosyn empirically for sepsis from aspiration PNA.  MD added Levaquin this AM.  Tmax 100.6, WBC wnl, Scr improving for CG 38 ml/min.  Cultures pending.    Plan:  Adjust Levaquin to 750 mg IV q48h for current renal function.  Continue other antibiotics as ordered.  MD:  Tamiflu 75 mg po x 1 ordered last night (not yet administered).  Would you like to continue Tamiflu until influenza panel returns?    Thanks.  Geoffry Paradise, PharmD, BCPS Pager: [phone] 9:35 AM Pharmacy #: [phone]

## 2012-03-26 NOTE — Progress Notes (Signed)
ANTIBIOTIC CONSULT NOTE - INITIAL  Pharmacy Consult for Vancomycin/Zosyn Indication: rule out sepsis  Allergies  Allergen Reactions  . Carbamazepine Other (See Comments)    hyponatremia  . Sulfa Antibiotics Other (See Comments)    Unknown reaction  . Tessalon (Benzonatate) Other (See Comments)    dizziness    Patient Measurements: Height: 5\' 2"  (157.5 cm) Weight: 146 lb 9.7 oz (66.5 kg) IBW/kg (Calculated) : 50.1    Vital Signs: Temp: 97.5 F (36.4 C) (01/17 0345) Temp src: Core (Comment) (01/17 0345) BP: 110/54 mmHg (01/17 0345) Pulse Rate: 103  (01/17 0345) Intake/Output from previous day: 01/16 0701 - 01/17 0700 In: 250 [I.V.:250] Out: 500 [Urine:500] Intake/Output from this shift: Total I/O In: 250 [I.V.:250] Out: 500 [Urine:500]  Labs:  [facility] 03/26/12 0540 03/26/12 0055  WBC 4.5 --  HGB 10.5* --  PLT 174 --  LABCREA -- --  CREATININE -- 1.47*   Estimated Creatinine Clearance: 38.3 ml/min (by C-G formula based on Cr of 1.47). No results found for this basename: VANCOTROUGH:2,VANCOPEAK:2,VANCORANDOM:2,GENTTROUGH:2,GENTPEAK:2,GENTRANDOM:2,TOBRATROUGH:2,TOBRAPEAK:2,TOBRARND:2,AMIKACINPEAK:2,AMIKACINTROU:2,AMIKACIN:2, in the last 72 hours   Microbiology: Recent Results (from the past 720 hour(s))  MRSA PCR SCREENING     Status: Normal   Collection Time   03/26/12  4:08 AM      Component Value Range Status Comment   MRSA by PCR NEGATIVE  NEGATIVE Final     Medical History: Past Medical History  Diagnosis Date  . Autistic spectrum disorder   . Anxiety disorder   . Seizure   . MR (mental retardation), moderate   . Torticollis     Medications:  Scheduled:    . [COMPLETED] sodium chloride   Intravenous Once  . [COMPLETED] acetaminophen      . antiseptic oral rinse  15 mL Mouth Rinse BID  . aspirin EC  81 mg Oral Daily  . clomiPRAMINE  100 mg Oral QHS  . divalproex  1,000 mg Oral QHS  . enoxaparin (LOVENOX) injection  40 mg Subcutaneous Q24H  .  oseltamivir  75 mg Oral Once  . [COMPLETED] piperacillin-tazobactam (ZOSYN)  IV  3.375 g Intravenous Once  . sertraline  200 mg Oral Daily  . [COMPLETED] sodium chloride  1,500 mL Intravenous Once  . [COMPLETED] vancomycin  1,000 mg Intravenous Once   Infusions:    . sodium chloride 125 mL/hr at 03/26/12 0507   Assessment: 57 yo admitted with acute respiratory failure and likely sepsis from respiratory source. (Aspiration PNA)  Goal of Therapy:  Vancomycin trough level 15-20 mcg/ml  Plan:   Zosyn 3.375 Gm IV q8h.  EI infusion  Vancomycin 500mg  IV q12h. CrCl~48 (N)  F/U SCr/levels as needed.  Sydney Hatfield 03/26/2012,6:06 AM

## 2012-03-27 DIAGNOSIS — I959 Hypotension, unspecified: Secondary | ICD-10-CM

## 2012-03-27 LAB — URINE CULTURE: Culture: NO GROWTH

## 2012-03-27 LAB — BASIC METABOLIC PANEL
Calcium: 8.2 mg/dL — ABNORMAL LOW (ref 8.4–10.5)
GFR calc Af Amer: >90 mL/min (ref >90)
GFR calc non Af Amer: >90 mL/min (ref >90)
Glucose, Bld: 90 mg/dL (ref 70–99)
Potassium: 3.1 mEq/L — ABNORMAL LOW (ref 3.5–5.1)
Sodium: 138 mEq/L (ref 135–145)

## 2012-03-27 MED ORDER — POTASSIUM CHLORIDE CRYS ER 20 MEQ PO TBCR: 40.0000 meq | EXTENDED_RELEASE_TABLET | Freq: Once | ORAL | Status: DC

## 2012-03-27 MED ORDER — SODIUM CHLORIDE 0.9 % IV BOLUS (SEPSIS)
500.0000 mL | Freq: Once | INTRAVENOUS | Status: AC
  Administered 2012-03-27: 500 mL via INTRAVENOUS

## 2012-03-27 MED ORDER — POTASSIUM CHLORIDE 10 MEQ/100ML IV SOLN
10.0000 meq | INTRAVENOUS | Status: AC
  Administered 2012-03-27 (×4): 10 meq via INTRAVENOUS
  Filled 2012-03-27 (×4): qty 100

## 2012-03-27 MED ORDER — VANCOMYCIN HCL IN DEXTROSE 1-5 GM/200ML-% IV SOLN
1000.0000 mg | Freq: Two times a day (BID) | INTRAVENOUS | Status: DC
  Administered 2012-03-27 – 2012-03-30 (×6): 1000 mg via INTRAVENOUS
  Filled 2012-03-27 (×7): qty 200

## 2012-03-27 MED ORDER — VITAMINS A & D EX OINT
TOPICAL_OINTMENT | CUTANEOUS | Status: AC
  Filled 2012-03-27: qty 5

## 2012-03-27 MED ORDER — LEVOFLOXACIN IN D5W 750 MG/150ML IV SOLN
750.0000 mg | INTRAVENOUS | Status: DC
  Administered 2012-03-27 – 2012-04-01 (×6): 750 mg via INTRAVENOUS
  Filled 2012-03-27 (×7): qty 150

## 2012-03-27 NOTE — Progress Notes (Signed)
Hypotension   Fluid bolus ordered

## 2012-03-27 NOTE — Progress Notes (Signed)
ANTIBIOTIC CONSULT NOTE - FOLLOW UP  Pharmacy Consult for Vancomycin, Zosyn, renal adjustment of antibiotics Indication: sepsis from aspiration PNA  Allergies  Allergen Reactions  . Carbamazepine Other (See Comments)    hyponatremia  . Sulfa Antibiotics Other (See Comments)    Unknown reaction  . Tessalon (Benzonatate) Other (See Comments)    dizziness   Labs:  Basename 03/27/12 0920 03/26/12 0540 03/26/12 0055  WBC -- 4.5 --  HGB -- 10.5* --  PLT -- 174 --  LABCREA -- -- --  CREATININE 0.73 -- 1.47*    Assessment:  11 yof presented to ED 1/13 with c/o AMS after choking episode, plan admit but patient left AMA back to group home. Pt was prescribed Levaquin by PCP at follow up then returned 1/17 to ED with anxiety/SOB, ED eval suspected aspiration. MD started abx for sepsis from aspiration, r/o influenza.  Today is D#2 on Vanc, Zosyn, Levaquin.  Tmax 103.5 from yesterday, afebrile since this AM.  WBC wnl, Scr greatly improved for CG CrCl of 70 ml/min and normalized CrCl 89 ml/min.  PCT from 1/17 = 0.42.    Cultures are as below ...   1/17 blood x 2 >> NGTD 1/17 urine >> NGF 1/17 mrsa pcr >> negative 1/17 sputum >> GPC in [place], GPR, GNR in GS 1/17 influenza >> negative 1/17 strep pneumo/legionella ag >> neg/pending   Goal of Therapy:  Vancomycin trough level 15-20 mcg/ml Appropriate renal dosing of Zosyn and Levaquin  Plan:   Change Levaquin to 750 mg IV q24h  Change Vancomycin to 1gm IV q12h   Continue Zosyn as ordered  Pharmacy will f/u  Geoffry Paradise, PharmD, BCPS Pager: [phone] 1:40 PM Pharmacy #: [id number]

## 2012-03-27 NOTE — Progress Notes (Signed)
Hypotension   NS bolus 500 cc ordered

## 2012-03-27 NOTE — Progress Notes (Addendum)
[facility] PROGRESS NOTE  Sydney Hatfield ZOX:[id number] DOB: 1956/03/01 DOA: 03/26/2012 PCP: Mickie Hillier, MD  Brief Narrative:  57 year old female with moderate mental retardation, autism, cervical dystonia, history of seizure disorder who apparently choked while eating at the group home and was brought to the ED 2 days back and initially planned for admission but signed out AMA  by her mother. She did well for one day but was noted to be very short of breath and coughing and febrile. She was started on Levaquin by her PCP the next day. In the ED patient was noted to be hypoxic , tachycardic and febrile and admitted to step down monitoring for sepsis secondary to acute respiratory failure with possible aspiration pneumonia. .  Assessment/Plan:  Sepsis with acute respiratory failure likely secondary to aspiration pneumonia  Patient admitted to step down for closer monitoring. She was started on IV vancomycin and Zosyn for broader antibiotic coverage. added on Levaquin for atypical coverage.  -repeat CXR PA lateral shows bibasilar opacities. Patient febrile, tachycardic and tachypnic on 1/17. Was hypotensive and tachypnic  overnight  requiring 500 ccx 2 IV NS bolus. ABG was stable. BP stable this am. HR and RR stable and remains afebrile. o2 sat stable on RA. -D/w PCCM , no need for evaluation at this time. Low BP could be in the setting of multiple meds including klonopin, clomipramine and Depakote that she's on. Will d/c clomipramine and give Depakote and klonopin at different intervals. -Continue close to down monitoring. Continue broad-spectrum antibiotic. Tylenol when necessary for fever. Continue with scheduled nebs. Follow blood and sputum culture. ( sputum cx growing some GPC) Follow Legionella antigen. Rapid flu was negative   Dysphagia with risk for aspiration  -Patient seen by swallow eval. She is at high risk for aspiration. She's made n.p.o. currently and we will reassess  her once her sepsis improved.  placed her on D5 NS for hydration. I have spoken with her neurologist Dr. Herminio Heads at [facility]. Patient  Received botulinum  toxin injection 175 mg on December 17 (4 weeks back) for her cervical dystonia. Her neurologist does not agree that aspiration is caused by effect of the botulinum toxin. Patient has received her third dose of Botulinum toxin this year and her neurologist feels the risk of aspiration with botulinum toxin is not common.  -Continue frequent suctioning with head of bed elevation. -Speech and swallow to re-evaluate. -Will continue necessary home medications with apple sauce.   History of seizure disorder  Depakote level is therapeutic. Continue current dose of Depakote.   History of mental retardation with agitation.  Continue her home dose of Klonopin. Patient noted to be quite tachycardic likely in the setting of underlying sepsis and agitation.  Continue Zoloft     Chronic torticollis  Patient has been taking botulinum toxin toxin injection every 3 months for past 9 months and follows with Dr. Herminio Heads at [facility]. Last injection received 1 months back. Discussed plan with Dr Marya Fossa.  Iron Deficiency anemia  Continue iron supplements   Hypokalemia  replenish    Code Status:  Full code Family Communication: mother at bedside Disposition Plan: currently inpatient. Continue SDU monitoring   Consultants:  none  Procedures:  none  Antibiotics: IV vnaco , zosyn and levaquin ( 1/17>>)  HPI/Subjective: Febrile all day on 11/17. Repeat CXR  PA and lateral shows bibasilar opacity. Became hypotensive overnight and given 1 L IV NS bolus. Sleepy this morning. Mother at bedside  Objective: Filed Vitals:   03/27/12 0400 03/27/12 0630 03/27/12 0800 03/27/12 1200  BP: 92/42 87/46 92/54  117/61  Pulse: 109 95 95 91  Temp: 99 F (37.2 C)  98.4 F (36.9 C)   TempSrc: Oral  Oral   Resp: 29 18 28 26     Height:      Weight: 65.7 kg (144 lb 13.5 oz)     SpO2: 94% 99% 98% 96%    Intake/Output Summary (Last 24 hours) at 03/27/12 1320 Last data filed at 03/27/12 1050  Gross per 24 hour  Intake   2800 ml  Output    910 ml  Net   1890 ml   Filed Weights   03/26/12 0345 03/27/12 0400  Weight: 66.5 kg (146 lb 9.7 oz) 65.7 kg (144 lb 13.5 oz)    Exam:  General: Middle aged female lying in bed in some respiratory distress  HEENT: On 2 L via [place],  no pallor,  Cardiovascular: S1 and S2 Normal, no murmurs rub or gallop  Respiratory: Coarse breath sounds bilaterally, breathing stable Abdomen: Soft, nontender, nondistended, bowel sounds present  Extremities: Warm, no edema  CNS: sleepy , non focal   Data Reviewed: Basic Metabolic Panel:  Lab 03/27/12 8413 03/26/12 0055 03/22/12 2300  NA 138 138 131*  K 3.1* 4.4 4.0  CL 107 102 94*  CO2 22 19 24   GLUCOSE 90 87 95  BUN 10 24* 19  CREATININE 0.73 1.47* 1.32*  CALCIUM 8.2* 8.9 9.6  MG -- -- --  PHOS -- -- --   Liver Function Tests:  Lab 03/26/12 0055 03/22/12 2300  AST 29 17  ALT 9 10  ALKPHOS 85 100  BILITOT 0.2* 0.3  PROT 6.7 7.4  ALBUMIN 2.7* 3.4*   No results found for this basename: LIPASE:5,AMYLASE:5 in the last 168 hours No results found for this basename: AMMONIA:5 in the last 168 hours CBC:  Lab 03/26/12 0540 03/22/12 2300  WBC 4.5 12.2*  NEUTROABS 3.5 8.9*  HGB 10.5* 11.4*  HCT 32.2* 34.1*  MCV 90.2 88.6  PLT 174 189   Cardiac Enzymes: No results found for this basename: CKTOTAL:5,CKMB:5,CKMBINDEX:5,TROPONINI:5 in the last 168 hours BNP (last 3 results) No results found for this basename: PROBNP:3 in the last 8760 hours CBG:  Lab 03/26/12 0857  GLUCAP 72    Recent Results (from the past 240 hour(s))  URINE CULTURE     Status: Normal   Collection Time   03/26/12  1:26 AM      Component Value Range Status Comment   Specimen Description URINE, CATHETERIZED   Final    Special Requests NONE   Final     Culture  Setup Time 03/26/2012 14:22   Final    Colony Count NO GROWTH   Final    Culture NO GROWTH   Final    Report Status 03/27/2012 FINAL   Final   MRSA PCR SCREENING     Status: Normal   Collection Time   03/26/12  4:08 AM      Component Value Range Status Comment   MRSA by PCR NEGATIVE  NEGATIVE Final   CULTURE, EXPECTORATED SPUTUM-ASSESSMENT     Status: Normal   Collection Time   03/26/12  7:51 AM      Component Value Range Status Comment   Specimen Description SPUTUM   Final    Special Requests Normal   Final    Sputum evaluation     Final    Value:  THIS SPECIMEN IS ACCEPTABLE. RESPIRATORY CULTURE REPORT TO FOLLOW.   Report Status 03/26/2012 FINAL   Final   CULTURE, RESPIRATORY     Status: Normal (Preliminary result)   Collection Time   03/26/12  7:51 AM      Component Value Range Status Comment   Specimen Description SPUTUM   Final    Special Requests NONE   Final    Gram Stain     Final    Value: ABUNDANT WBC PRESENT,BOTH PMN AND MONONUCLEAR     FEW SQUAMOUS EPITHELIAL CELLS PRESENT     RARE GRAM POSITIVE COCCI     IN PAIRS RARE GRAM POSITIVE RODS     RARE GRAM NEGATIVE RODS   Culture PENDING   Incomplete    Report Status PENDING   Incomplete      Studies: Dg Chest 1 View  03/26/2012  *RADIOLOGY REPORT*  Clinical Data: Fever, respiratory distress.  CHEST - 1 VIEW  Comparison: 03/23/2012  Findings: Left retrocardiac linear scarring or atelectasis.  Low lung volumes with resultant crowding of bronchovascular structures. Suspect mild central pulmonary vascular congestion and some mild bibasilar interstitial edema or infiltrates.  No confluent airspace disease.  No effusion.  Heart size upper limits normal.  Regional bones unremarkable.  IMPRESSION:  Low volumes with suspicion of central pulmonary vascular congestion and mild bibasilar interstitial edema.   Original Report Authenticated By: D. Andria Rhein, MD    Dg Chest 2 View  03/26/2012  *RADIOLOGY REPORT*  Clinical  Data: Evaluate for pneumonia.  CHEST - 2 VIEW  Comparison: Earlier today at both 51 hours.  Findings: Degraded lateral view secondary position.  Mildly motion degraded frontal view. Patient rotated right.  Borderline cardiomegaly, accentuated by AP portable technique.  Probable small bilateral pleural effusions on the lateral. No pneumothorax.  The chin overlies the apices.  Development of patchy bibasilar airspace disease.  IMPRESSION: Developing bibasilar airspace disease, which could represent atelectasis or infection.  Probable small bilateral pleural effusions.  Multifactorial degradation.   Original Report Authenticated By: Jeronimo Greaves, M.D.     Scheduled Meds:   . antiseptic oral rinse  15 mL Mouth Rinse BID  . aspirin EC  81 mg Oral Daily  . clomiPRAMINE  100 mg Oral QHS  . clonazePAM  0.5 mg Oral TID  . clonazePAM  1 mg Oral Daily  . divalproex  1,000 mg Oral QHS  . enoxaparin (LOVENOX) injection  40 mg Subcutaneous Q24H  . levofloxacin (LEVAQUIN) IV  750 mg Intravenous Q48H  . pantoprazole  40 mg Oral Daily  . piperacillin-tazobactam (ZOSYN)  IV  3.375 g Intravenous Q8H  . sertraline  200 mg Oral Daily  . vancomycin  500 mg Intravenous Q12H   Continuous Infusions:   . dextrose 5 % and 0.9% NaCl 100 mL/hr at 03/27/12 0511      Time spent: 35 minutes    Alta Goding  [facility] Pager [phone] If 8PM-8AM, please contact night-coverage at [email or web address], password [facility] 03/27/2012, 1:20 PM  LOS: 1 day

## 2012-03-28 DIAGNOSIS — E876 Hypokalemia: Secondary | ICD-10-CM | POA: Diagnosis not present

## 2012-03-28 LAB — CULTURE, RESPIRATORY W GRAM STAIN

## 2012-03-28 LAB — CBC
HCT: 30.1 % — ABNORMAL LOW (ref 36.0–46.0)
Hemoglobin: 10.2 g/dL — ABNORMAL LOW (ref 12.0–15.0)
MCH: 29.2 pg (ref 26.0–34.0)
MCHC: 33.9 g/dL (ref 30.0–36.0)
RBC: 3.49 MIL/uL — ABNORMAL LOW (ref 3.87–5.11)
WBC: 5.7 10*3/uL (ref 4.0–10.5)

## 2012-03-28 LAB — BASIC METABOLIC PANEL
BUN: 7 mg/dL (ref 6–23)
Calcium: 8.4 mg/dL (ref 8.4–10.5)
GFR calc non Af Amer: >90 mL/min (ref >90)
Glucose, Bld: 94 mg/dL (ref 70–99)
Potassium: 3 mEq/L — ABNORMAL LOW (ref 3.5–5.1)

## 2012-03-28 LAB — PROCALCITONIN: Procalcitonin: 0.56 ng/mL

## 2012-03-28 MED ORDER — VALPROATE SODIUM 500 MG/5ML IV SOLN
500.0000 mg | Freq: Two times a day (BID) | INTRAVENOUS | Status: DC
  Administered 2012-03-28 – 2012-04-04 (×14): 500 mg via INTRAVENOUS
  Filled 2012-03-28 (×18): qty 5

## 2012-03-28 MED ORDER — VALPROATE SODIUM 500 MG/5ML IV SOLN: 1000.0000 mg | Freq: Every day | INTRAVENOUS | Status: DC

## 2012-03-28 MED ORDER — POTASSIUM CHLORIDE 10 MEQ/100ML IV SOLN
10.0000 meq | INTRAVENOUS | Status: AC
  Administered 2012-03-28 (×4): 10 meq via INTRAVENOUS

## 2012-03-28 MED ORDER — POTASSIUM CHLORIDE 10 MEQ/100ML IV SOLN
INTRAVENOUS | Status: AC
  Filled 2012-03-28: qty 400

## 2012-03-28 MED ORDER — GUAIFENESIN 100 MG/5ML PO SYRP
200.0000 mg | ORAL_SOLUTION | ORAL | Status: DC | PRN
  Administered 2012-03-28: 200 mg via ORAL
  Filled 2012-03-28 (×2): qty 118

## 2012-03-28 NOTE — Progress Notes (Signed)
[facility] PROGRESS NOTE  Sydney Hatfield EXB:[id number] DOB: 31-May-1955 DOA: 03/26/2012 PCP: Mickie Hillier, MD  Brief Narrative:  57 year old female with moderate mental retardation, autism, cervical dystonia, history of seizure disorder who apparently choked while eating at the group home and was brought to the ED 2 days back and initially planned for admission but signed out AMA by her mother. She did well for one day but was noted to be very short of breath and coughing and febrile. She was started on Levaquin by her PCP the next day. In the ED patient was noted to be hypoxic , tachycardic and febrile and admitted to step down monitoring for sepsis secondary to acute respiratory failure with possible aspiration pneumonia.    Assessment/Plan:  Sepsis with acute respiratory failure likely secondary to aspiration pneumonia  Patient admitted to step down and  started on IV vancomycin , zosyn and Levaquin  -repeat CXR PA lateral shows bibasilar opacities. Patient febrile, tachycardic and tachypnic on 1/17. Was hypotensive and tachypnic overnight requiring 500 ccx 2 IV NS bolus. ABG was stable.  -Tmax of 101.8. Tachycardic with anxiety and cough. RR and sats normal on 1-2 L via [place]. -. -d/c clomipramine and give Depakote and klonopin at different intervals given low BP. -Blood cx so far negative. Rapid flu negative. Legionella ag pending. -antitussive for cough  Dysphagia with risk for aspiration  -seen by swallow eval and high risk for aspiration. Made NPO. Patient more awake and oriented now and will ask to reassess. -. I have spoken with her neurologist Dr. Herminio Heads at [facility] on 1/17. 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 Her neurologist does not agree that aspiration is caused by effect of the botulinum toxin. Patient has received her third dose of Botulinum toxin this year and her neurologist  feels the risk of aspiration with botulinum toxin is not common.  -Continue frequent suctioning with head of bed elevation.  -Will continue necessary home medications with apple sauce.   History of seizure disorder  Depakote level is therapeutic. Continue current dose of Depakote.   History of mental retardation with agitation.  Continue her home dose of Klonopin. Continue Zoloft   Chronic torticollis  Patient has been taking botulinum toxin toxin injection every 3 months for past 9 months and follows with Dr. Herminio [facility]ads at [facility]. Last injection received 1 months back. Discussed plan with Dr Marya Fossa.   Iron Deficiency anemia  Continue iron supplements   Hypokalemia  replenish   Code Status: Full code  Family Communication: mother and caretaker at bedside  Disposition Plan: currently inpatient. Continue SDU monitoring   Consultants:  none Procedures:  none Antibiotics:  IV vnaco , zosyn and levaquin ( 1/17>>)   HPI/Subjective:  Tmax of 101.30F was sleepy all day yesterday so held her klonipin. dced clomipramine. Awake and communicating today. C/o cough      Objective: Filed Vitals:   03/28/12 0000 03/28/12 0003 03/28/12 0400 03/28/12 0600  BP: 103/57  102/62 140/89  Pulse: 112  100 108  Temp:  98.3 F (36.8 C)    TempSrc:  Oral    Resp: 26  26 26   Height:      Weight:    67.1 kg (147 lb 14.9 oz)  SpO2: 94%  93% 97%    Intake/Output Summary (Last 24 hours) at 03/28/12 0820 Last data filed at 03/28/12 0615  Gross per 24 hour  Intake  2762.5 ml  Output   2250 ml  Net  512.5 ml   Filed Weights   03/26/12 0345 03/27/12 0400 03/28/12 0600  Weight: 66.5 kg (146 lb 9.7 oz) 65.7 kg (144 lb 13.5 oz) 67.1 kg (147 lb 14.9 oz)    Exam:  General: Middle aged female lying in bed in NAD. Appears fatigued  HEENT:  no pallor, moist mucosa Cardiovascular: S1 and S2 Normal, no murmurs rub or gallop  Respiratory: Coarse breath sounds bilaterally,    Abdomen: Soft, nontender, nondistended, bowel sounds present  Extremities: Warm, no edema AAOX3, non focal   Data Reviewed: Basic Metabolic Panel:  Lab 03/28/12 0454 03/27/12 0920 03/26/12 0055 03/22/12 2300  NA 133* 138 138 131*  K 3.0* 3.1* 4.4 4.0  CL 101 107 102 94*  CO2 22 22 19 24   GLUCOSE 94 90 87 95  BUN 7 10 24* 19  CREATININE 0.64 0.73 1.47* 1.32*  CALCIUM 8.4 8.2* 8.9 9.6  MG -- -- -- --  PHOS -- -- -- --   Liver Function Tests:  Lab 03/26/12 0055 03/22/12 2300  AST 29 17  ALT 9 10  ALKPHOS 85 100  BILITOT 0.2* 0.3  PROT 6.7 7.4  ALBUMIN 2.7* 3.4*   No results found for this basename: LIPASE:5,AMYLASE:5 in the last 168 hours No results found for this basename: AMMONIA:5 in the last 168 hours CBC:  Lab 03/28/12 0354 03/26/12 0540 03/22/12 2300  WBC 5.7 4.5 12.2*  NEUTROABS -- 3.5 8.9*  HGB 10.2* 10.5* 11.4*  HCT 30.1* 32.2* 34.1*  MCV 86.2 90.2 88.6  PLT 169 174 189   Cardiac Enzymes: No results found for this basename: CKTOTAL:5,CKMB:5,CKMBINDEX:5,TROPONINI:5 in the last 168 hours BNP (last 3 results) No results found for this basename: PROBNP:3 in the last 8760 hours CBG:  Lab 03/26/12 0857  GLUCAP 72    Recent Results (from the past 240 hour(s))  CULTURE, BLOOD (ROUTINE X 2)     Status: Normal (Preliminary result)   Collection Time   03/26/12 12:55 AM      Component Value Range Status Comment   Specimen Description BLOOD HAC   Final    Special Requests NONE BOTTLES DRAWN AEROBIC AND ANAEROBIC 5CC   Final    Culture  Setup Time 03/26/2012 11:07   Final    Culture     Final    Value:        BLOOD CULTURE RECEIVED NO GROWTH TO DATE CULTURE WILL BE HELD FOR 5 DAYS BEFORE ISSUING A FINAL NEGATIVE REPORT   Report Status PENDING   Incomplete   URINE CULTURE     Status: Normal   Collection Time   03/26/12  1:26 AM      Component Value Range Status Comment   Specimen Description URINE, CATHETERIZED   Final    Special Requests NONE   Final     Culture  Setup Time 03/26/2012 14:22   Final    Colony Count NO GROWTH   Final    Culture NO GROWTH   Final    Report Status 03/27/2012 FINAL   Final   MRSA PCR SCREENING     Status: Normal   Collection Time   03/26/12  4:08 AM      Component Value Range Status Comment   MRSA by PCR NEGATIVE  NEGATIVE Final   CULTURE, BLOOD (ROUTINE X 2)     Status: Normal (Preliminary result)   Collection Time   03/26/12  5:30  AM      Component Value Range Status Comment   Specimen Description BLOOD LEFT HAND   Final    Special Requests BOTTLES DRAWN AEROBIC ONLY 1CC   Final    Culture  Setup Time 03/26/2012 11:07   Final    Culture     Final    Value:        BLOOD CULTURE RECEIVED NO GROWTH TO DATE CULTURE WILL BE HELD FOR 5 DAYS BEFORE ISSUING A FINAL NEGATIVE REPORT   Report Status PENDING   Incomplete   CULTURE, EXPECTORATED SPUTUM-ASSESSMENT     Status: Normal   Collection Time   03/26/12  7:51 AM      Component Value Range Status Comment   Specimen Description SPUTUM   Final    Special Requests Normal   Final    Sputum evaluation     Final    Value: THIS SPECIMEN IS ACCEPTABLE. RESPIRATORY CULTURE REPORT TO FOLLOW.   Report Status 03/26/2012 FINAL   Final   CULTURE, RESPIRATORY     Status: Normal (Preliminary result)   Collection Time   03/26/12  7:51 AM      Component Value Range Status Comment   Specimen Description SPUTUM   Final    Special Requests NONE   Final    Gram Stain     Final    Value: ABUNDANT WBC PRESENT,BOTH PMN AND MONONUCLEAR     FEW SQUAMOUS EPITHELIAL CELLS PRESENT     RARE GRAM POSITIVE COCCI     IN PAIRS RARE GRAM POSITIVE RODS     RARE GRAM NEGATIVE RODS   Culture NORMAL OROPHARYNGEAL FLORA   Final    Report Status PENDING   Incomplete      Studies: Dg Chest 2 View  03/26/2012  *RADIOLOGY REPORT*  Clinical Data: Evaluate for pneumonia.  CHEST - 2 VIEW  Comparison: Earlier today at both 51 hours.  Findings: Degraded lateral view secondary position.  Mildly motion  degraded frontal view. Patient rotated right.  Borderline cardiomegaly, accentuated by AP portable technique.  Probable small bilateral pleural effusions on the lateral. No pneumothorax.  The chin overlies the apices.  Development of patchy bibasilar airspace disease.  IMPRESSION: Developing bibasilar airspace disease, which could represent atelectasis or infection.  Probable small bilateral pleural effusions.  Multifactorial degradation.   Original Report Authenticated By: Jeronimo Greaves, M.D.     Scheduled Meds:   . antiseptic oral rinse  15 mL Mouth Rinse BID  . aspirin EC  81 mg Oral Daily  . clonazePAM  0.5 mg Oral TID  . clonazePAM  1 mg Oral Daily  . divalproex  1,000 mg Oral QHS  . enoxaparin (LOVENOX) injection  40 mg Subcutaneous Q24H  . levofloxacin (LEVAQUIN) IV  750 mg Intravenous Q24H  . pantoprazole  40 mg Oral Daily  . piperacillin-tazobactam (ZOSYN)  IV  3.375 g Intravenous Q8H  . potassium chloride  10 mEq Intravenous Q1 Hr x 4  . potassium chloride      . sertraline  200 mg Oral Daily  . vancomycin  1,000 mg Intravenous Q12H  . vitamin A & D      . vitamin A & D       Continuous Infusions:   . dextrose 5 % and 0.9% NaCl 100 mL/hr at 03/27/12 1814      Time spent: 35 MINUTES   Rajan Burgard  [facility] Pager [phone]. If 8PM-8AM, please contact night-coverage at [email or web address], password [facility]  03/28/2012, 8:20 AM  LOS: 2 days

## 2012-03-28 NOTE — Progress Notes (Signed)
eLink Physician Progress Note and Electrolyte Replacement  Patient Name: MATISHA TERMINE DOB: 1956/01/16 MRN: [id number]  Date of Service  03/28/2012   HPI/Events of Note    Lab 03/28/12 0354 03/27/12 0920 03/26/12 0055 03/22/12 2300  NA 133* 138 138 131*  K 3.0* 3.1* -- --  CL 101 107 102 94*  CO2 22 22 19 24   GLUCOSE 94 90 87 95  BUN 7 10 24* 19  CREATININE 0.64 0.73 1.47* 1.32*  CALCIUM 8.4 8.2* 8.9 9.6  MG -- -- -- --  PHOS -- -- -- --    Estimated Creatinine Clearance: 69.8 ml/min (by C-G formula based on Cr of 0.64).  Intake/Output      01/18 0701 - 01/19 0700   I.V. (mL/kg) 1600 (24.4)   IV Piggyback 1062.5   Total Intake(mL/kg) 2662.5 (40.5)   Urine (mL/kg/hr) 1500 (1)   Total Output 1500   Net +1162.5        - I/O DETAILED x 24h    Total I/O In: 1150 [I.V.:800; IV Piggyback:350] Out: 650 [Urine:650] - I/O THIS SHIFT    ASSESSMENT Low K  eICURN Interventions  kcl IV   ASSESSMENT: MAJOR ELECTROLYTE      Dr. Kalman Shan, M.D., [facility].C.P Pulmonary and Critical Care Medicine Staff Physician [facility] [place] [facility] Pager: [phone], If no answer or between  15:00h - 7:00h: call [phone]  03/28/2012 5:51 AM

## 2012-03-28 NOTE — Progress Notes (Signed)
Speech Language Pathology Dysphagia Treatment Patient Details Name: Sydney Hatfield MRN: [id number] DOB: September 25, 1955 Today's Date: 03/28/2012 Time: 1300-1340 SLP Time Calculation (min): 40 min  Assessment / Plan / Recommendation Clinical Impression  Focus of diagnostic treatment to reassess patient's swallow for PO readiness following initial BSE completed on 03/26/12.  . RN paged treating SLP to reassess this date as patient with increased LOA and appropriate.  Patient with continued +s/s of aspiration s/p swallow of ice chips and thin water by teaspoon.  No immediate cough noted with puree trials but RR increased to 38 with swallows in succession.    Recommend continued NPO status as patient continues with decreased ability to protect her airway.  Proceed with objective evaluation of MBS to assess risk for aspiration and determine safest, Po diet with necessary strategies to decrease risk of aspiration.  MBS to be completed on 03/29/12.      Diet Recommendation  Continue with Current Diet: NPO    SLP Plan New goals to be determined pending objective testing   Pertinent Vitals/Pain RR increased from 20's to 38 s/p puree trials   Swallowing Goals   Pending results of MBS General Temperature Spikes Noted: No Respiratory Status: Supplemental O2 delivered via (comment) Behavior/Cognition: Alert;Cooperative;Requires cueing;Distractible Oral Cavity - Dentition: Adequate natural dentition Patient Positioning: Upright in bed  Oral Cavity - Oral Hygiene Does patient have any of the following "at risk" factors?: Lips - dry, cracked;Oxygen therapy - cannula, mask, simple oxygen devices;Nutritional status - inadequate Patient is HIGH RISK - Oral Care Protocol followed (see row info): Yes Patient is AT RISK - Oral Care Protocol followed (see row info): Yes   Dysphagia Treatment Treatment focused on: Upgraded PO texture trials;Facilitation of pharyngeal phase;Patient/family/caregiver  education Family/Caregiver Educated: Patient's mother  Treatment Methods/Modalities: Skilled observation Patient observed directly with PO's: Yes Type of PO's observed: Dysphagia 1 (puree);Thin liquids;Ice chips Feeding: Total assist Liquids provided via: Teaspoon Pharyngeal Phase Signs & Symptoms: Suspected delayed swallow initiation;Delayed cough Type of cueing: Verbal Amount of cueing: Minimal   GO    Moreen Fowler MS, CCC-SLP [phone] [facility] 03/28/2012, 1:57 PM

## 2012-03-28 NOTE — Progress Notes (Signed)
Patient had a four beat run of V tach on monitor. Patient asymptomatic at that time denies any chest pain. Dr. Gonzella Lex notified will continue to monitior.

## 2012-03-28 NOTE — Evaluation (Signed)
Physical Therapy Evaluation Patient Details Name: Sydney Hatfield MRN: [id number] DOB: 11/20/1955 Today's Date: 03/28/2012 Time: 4098-1191 PT Time Calculation (min): 28 min  PT Assessment / Plan / Recommendation Clinical Impression  Pt presents with sepsis, SOB and coughing.  Noted in chart she has history of MR, autism and L torticollis living in group home.  Tolerated sitting EOB x approx 10 mins, however pt with intermittent bouts of agitation and would resist mobility and refused to get OOB.  HR up to 140's during session, O2 in upper 90s on 4L O2.  Took O2 off momentarily during mobility with O2 dropping to 88%.  Reapplied O2.  Mother present during session to assist with calming pt.  Pt will benefit from skilled PT in acute venue to address deficits.  PT recommends SNF vs HHPT at group home pending pt progress.      PT Assessment  Patient needs continued PT services    Follow Up Recommendations  Home health PT;SNF;Supervision/Assistance - 24 hour    Does the patient have the potential to tolerate intense rehabilitation      Barriers to Discharge None Pt has 24/7 care at this time. Will continue to assess if pt is requiring +2 assist.     Equipment Recommendations  Rolling walker with 5" wheels    Recommendations for Other Services     Frequency Min 3X/week    Precautions / Restrictions Precautions Precautions: Fall Precaution Comments: Monitor HR, up to 140's during session with sitting EOB.  Restrictions Weight Bearing Restrictions: No   Pertinent Vitals/Pain Pt mentioned pain in her abdomen, worsened with coughing      Mobility  Bed Mobility Bed Mobility: Supine to Sit;Sitting - Scoot to Edge of Bed;Sit to Supine Supine to Sit: 1: +2 Total assist Supine to Sit: Patient Percentage: 20% Sitting - Scoot to Edge of Bed: 1: +2 Total assist Sitting - Scoot to Edge of Bed: Patient Percentage: 40% Sit to Supine: 1: +2 Total assist Sit to Supine: Patient Percentage:  20% Details for Bed Mobility Assistance: Requires physical assist for LEs out of bed and for trunk due to decreased trunk control.  Initially pt assisted some with legs, however became resistant and somewhat agitated, requiring increased assist.  Transfers Transfers: Not assessed Details for Transfer Assistance: Pt refused OOB Ambulation/Gait Ambulation/Gait Assistance: Not tested (comment)    Shoulder Instructions     Exercises     PT Diagnosis: Difficulty walking;Generalized weakness;Acute pain  PT Problem List: Decreased strength;Decreased activity tolerance;Decreased balance;Decreased mobility;Decreased cognition;Decreased knowledge of use of DME;Decreased safety awareness;Decreased knowledge of precautions;Pain PT Treatment Interventions: DME instruction;Gait training;Functional mobility training;Therapeutic activities;Therapeutic exercise;Balance training;Patient/family education   PT Goals Acute Rehab PT Goals PT Goal Formulation: With patient/family Time For Goal Achievement: 04/11/12 Potential to Achieve Goals: Fair Pt will go Supine/Side to Sit: with min assist PT Goal: Supine/Side to Sit - Progress: Goal set today Pt will go Sit to Supine/Side: with min assist PT Goal: Sit to Supine/Side - Progress: Goal set today Pt will go Sit to Stand: with min assist PT Goal: Sit to Stand - Progress: Goal set today Pt will go Stand to Sit: with min assist PT Goal: Stand to Sit - Progress: Goal set today Pt will Transfer Bed to Chair/Chair to Bed: with mod assist PT Transfer Goal: Bed to Chair/Chair to Bed - Progress: Goal set today Pt will Ambulate: 16 - 50 feet;with mod assist;with least restrictive assistive device PT Goal: Ambulate - Progress: Goal set today  Visit Information  Last PT Received On: 03/28/12 Assistance Needed: +2    Subjective Data  Subjective: I don't want to walk. Patient Stated Goal: n/a   Prior Functioning  Home Living Lives With: Other  (Comment) Available Help at Discharge: Personal care attendant;Available 24 hours/day Home Access: Other (comment) (group home) Home Layout: One level Home Adaptive Equipment: None Prior Function Level of Independence: Needs assistance Needs Assistance: Bathing Able to Take Stairs?: No Vocation: Retired Musician: Expressive difficulties;Other (comment);Receptive difficulties (Pt with history of MR, autism)    Cognition  Overall Cognitive Status: History of cognitive impairments - at baseline Arousal/Alertness: Awake/alert Orientation Level: Disoriented to;Time;Situation Behavior During Session: Agitated    Extremity/Trunk Assessment Right Lower Extremity Assessment RLE ROM/Strength/Tone: Unable to fully assess;Due to impaired cognition;Deficits RLE ROM/Strength/Tone Deficits: grossly WFL per bed mobility and pt able to perform SLR to don/doff SCD's.  RLE Sensation: WFL - Light Touch Left Lower Extremity Assessment LLE ROM/Strength/Tone: Unable to fully assess;Due to impaired cognition;Deficits LLE ROM/Strength/Tone Deficits: grossly WFL per bed mobility and pt able to perform SLR to don/doff SCD's.  LLE Sensation: WFL - Light Touch Trunk Assessment Trunk Assessment: Other exceptions Trunk Exceptions: Pt with history of torticollis s/p botox injection in Dec. Also noted left lateral and flexed posture during session.    Balance Balance Balance Assessed: Yes Static Sitting Balance Static Sitting - Balance Support: Bilateral upper extremity supported;Feet supported Static Sitting - Level of Assistance: 5: Stand by assistance;4: Min assist;3: Mod assist Static Sitting - Comment/# of Minutes: Varying levels of assist throughout sitting EOB x approx 10 mins.  When pt calmer, she was able to sit at stand by assist, however would intermittently become agitated and require increased assist.  Again noted L toriticollis, forward flexed trunk and left lateral leaning.  Pt  able to correct trunk posture with verbal and manual cues from therapist.   End of Session PT - End of Session Equipment Utilized During Treatment: Oxygen Activity Tolerance: Treatment limited secondary to agitation Patient left: in bed;with call bell/phone within reach;with family/visitor present Nurse Communication: Mobility status;Other (comment) (HR)  GP     Hailey Miles, Meribeth Mattes 03/28/2012, 11:31 AM

## 2012-03-28 NOTE — Progress Notes (Signed)
Patients blood pressure 89/48, resting quietly in bed with no complaints. Notified Dr. Gonzella Lex no new orders received. Will continue to monitor and notify if needed.

## 2012-03-29 ENCOUNTER — Inpatient Hospital Stay (HOSPITAL_COMMUNITY): Payer: Medicare Other

## 2012-03-29 DIAGNOSIS — R1310 Dysphagia, unspecified: Secondary | ICD-10-CM | POA: Diagnosis present

## 2012-03-29 LAB — BASIC METABOLIC PANEL
BUN: 11 mg/dL (ref 6–23)
Chloride: 102 mEq/L (ref 96–112)
Creatinine, Ser: 1 mg/dL (ref 0.50–1.10)
GFR calc Af Amer: 70 mL/min — ABNORMAL LOW (ref >90)

## 2012-03-29 MED ORDER — POTASSIUM CHLORIDE CRYS ER 20 MEQ PO TBCR: 40.0000 meq | EXTENDED_RELEASE_TABLET | Freq: Once | ORAL | Status: DC

## 2012-03-29 MED ORDER — POTASSIUM CHLORIDE 10 MEQ/100ML IV SOLN
10.0000 meq | INTRAVENOUS | Status: AC
  Administered 2012-03-29 (×4): 10 meq via INTRAVENOUS
  Filled 2012-03-29: qty 400

## 2012-03-29 MED ORDER — POTASSIUM CHLORIDE CRYS ER 20 MEQ PO TBCR
40.0000 meq | EXTENDED_RELEASE_TABLET | Freq: Once | ORAL | Status: AC
  Administered 2012-03-29: 40 meq via ORAL
  Filled 2012-03-29: qty 2

## 2012-03-29 MED ORDER — GUAIFENESIN 100 MG/5ML PO SOLN
200.0000 mg | ORAL | Status: DC | PRN
  Administered 2012-03-29 – 2012-04-03 (×10): 200 mg via ORAL
  Filled 2012-03-29 (×10): qty 10

## 2012-03-29 NOTE — Progress Notes (Signed)
ANTIBIOTIC CONSULT NOTE - FOLLOW UP  Pharmacy Consult for Vancomycin, Zosyn, renal adjustment of antibiotics Indication: sepsis from aspiration PNA  Allergies  Allergen Reactions  . Carbamazepine Other (See Comments)    hyponatremia  . Sulfa Antibiotics Other (See Comments)    Unknown reaction  . Tessalon (Benzonatate) Other (See Comments)    dizziness   Labs:  Basename 03/29/12 0346 03/28/12 0354 03/27/12 0920  WBC -- 5.7 --  HGB -- 10.2* --  PLT -- 169 --  LABCREA -- -- --  CREATININE 1.00 0.64 0.73   Estimated Creatinine Clearance: 56 ml/min (by C-G formula based on Cr of 1).   Microbiology: 1/17 blood x 2 >> NGTD 1/17 urine >> NGF 1/17 mrsa pcr >> negative 1/17 sputum >> normal flora (pending final) 1/17 influenza >> negative 1/17 strep pneumo/legionella ag >> neg/pending   Assessment:  [name] admit on 1/13 with c/o AMS after choking episode.  Pt left AMA, but returned 1/17 to ED with anxiety/SOB, ED eval suspected aspiration.  Today is Day #4 on Vanc, Zosyn, Levaquin.   Scr increased (0.64 --> 1) overnight, with CrCl ~ 56 ml/min (CG).  Pharmacy to re-evaluate vanc dosing after vanc trough level tonight.  Goal of Therapy:  Vancomycin trough level 15-20 mcg/ml Appropriate abx dosing, eradication of infection.  Plan:   Continue Zosyn 3.375g IV Q8H infused over 4hrs.   Continue Levaquin 750mg  IV q24h  Continue Vancomycin dosing after levels obtained.  Measure Vanc trough at steady state.  Follow up renal fxn and culture results.  Lynann Beaver PharmD, BCPS Pager [phone] 03/29/2012 2:35 PM

## 2012-03-29 NOTE — Procedures (Addendum)
Objective Swallowing Evaluation: Modified Barium Swallowing Study  Patient Details  Name: Sydney Hatfield MRN: [id number] Date of Birth: 06/22/1955  Today's Date: 03/29/2012 Time: 1005-1055 SLP Time Calculation (min): 50 min  Past Medical History:  Past Medical History  Diagnosis Date  . Autistic spectrum disorder   . Anxiety disorder   . Seizure   . MR (mental retardation), moderate   . Torticollis    Past Surgical History:  Past Surgical History  Procedure Date  . Colonoscopy   . Breast biopsy    HPI:  57 yo female h/o moderate mental retardation, autism, seizure disorder 2 days ago at her group home choked while eating some stringy cheese someone had to do the hemlick maneuver on her which was successful.  She was brought to the ED and evaluated and was going to be admitted but her mother signed her out AMA back to the group home.  Pt then admitted next date with sob and coughing and fever diagnosed with pna.  Pt is now on 4 liters of oxygen after requiring NR previously.  Pt does becomes tachypneic with exertion.  H/O spasmodic torticollis present and pt receives botox injections every 3 months.     Assessment / Plan / Recommendation Clinical Impression  Dysphagia Diagnosis: Moderate oral phase dysphagia;Moderate pharyngeal phase dysphagia;Moderate cervical esophageal phase dysphagia  Clinical impression: Moderate oropharyngeal and suspected cervical esophageal dysphagia present suspected due to torticollis, MR and deconditioning.    Dysphagia characterized by weakness/decr oral control resulting in premature spillage of barium into pharynx and decr tongue base retraction resulting in pharyngeal stasis whic pt does not sense.  Dry swallows help to decrease pharyngeal residuals but do not clear them and pt did not follow directions to conduct further dry swallows.  Appearance of decreased UES opening, ? hypertonic due to toticullis with resultant pyriform sinus stasis.   Fortunately pt has large pyriform sinuses that gathers barium.  Please note, pt coughed throughout entire MBS and barium was not seen in larynx or trachea.  Her aspiration risk remains high due to her sensoriomotor dysphagia, decreased ability to follow directions to dry swallow to clear residuals and deconditioning.     Based on report from her mother that the pt will cough on occasion with intake and h/o MBS completion years prior, suspect chronic dysphagia that pt typically manages.  Mother states she would like pt to consume intake with KNOWN risks (recurrent pulmonary infections, aspiration) and strategies to maximiize airway protection with MD permission.  Pt is particular about what she eats but eating gives her QOL per mother.  Educated mother to importance of monitoring po tolerance, dry swallows to decr residuals, assuring pt not tachypenic, etc with intake.    Please note:  Pt's head was in chin tuck position as she did not hold it up even with max cues.  May be beneficial for airway protection.  Unable to assess swallow in head neutral, which is position mother states is pt's normal position.   Will inform MD to results and mother's desire and will follow.      Treatment Recommendation  Therapy as outlined in treatment plan below   Diet Recommendation NPO      OR       Puree/thin (currently) if MD and mother agree with acceptance of aspiration risks    If pt to initiate diet recommend the following strategies  Multiple swallows with EACH bite/sip  Follow solids with liquids Chin tuck position Crush medicine given with  applesauce - follow with water Rest breaks if with tachypnea or overtly coughing Sit upright for at least 30 minutes after meals      Other  Recommendations Oral Care Recommendations: Oral care QID   Follow Up Recommendations  Other (comment) (TBD) TBD   Frequency and Duration min 2x/week  2 weeks   Pertinent Vitals/Pain Low grade temp, lungs decreased- WEAK  nonproductive cough    SLP Swallow Goals Patient will utilize recommended strategies during swallow to increase swallowing safety with: Total assistance Goal #3: Pt's mother/MD will determine if pt to consume po with accepted asp risks and strategies in place to minimize aspiration independently.    General Date of Onset: 03/15/12 HPI: 57 yo female h/o moderate mental retardation, autism, seizure disorder 2 days ago at her group home choked while eating some stringy cheese someone had to do the hemlick maneuver on her which was successful.  She was brought to the ED and evaluated and was going to be admitted but her mother signed her out AMA back to the group home.  Pt then admitted next date with sob and coughing and fever diagnosed with pna.  Pt is now on 4 liters of oxygen after requiring NR previously.  Pt does becomes tachypneic with exertion.  H/O spasmodic torticollis present and pt receives botox injections every 3 months. Type of Study: Modified Barium Swallowing Study Reason for Referral: Objectively evaluate swallowing function Previous Swallow Assessment: Mother reports patient has had a MBS several years ago, but not in the [facility]. Diet Prior to this Study: NPO Respiratory Status: Supplemental O2 delivered via (comment) (NRB mask 50%) History of Recent Intubation: No Behavior/Cognition: Alert;Distractible;Requires cueing;Doesn't follow directions;Decreased sustained attention;Impulsive Oral Cavity - Dentition: Adequate natural dentition Oral Motor / Sensory Function: Impaired - see Bedside swallow eval Self-Feeding Abilities: Able to feed self;Needs assist Patient Positioning: Upright in chair Baseline Vocal Quality: Low vocal intensity Volitional Cough: Cognitively unable to elicit Volitional Swallow: Unable to elicit Anatomy: Other (Comment) (difficult to view due to motion degradation)    Reason for Referral Objectively evaluate swallowing function   Oral Phase Oral  Preparation/Oral Phase Oral Phase: Impaired Oral - Nectar Oral - Nectar Teaspoon: Piecemeal swallowing;Weak lingual manipulation;Reduced posterior propulsion Oral - Nectar Cup: Piecemeal swallowing;Weak lingual manipulation;Reduced posterior propulsion Oral - Thin Oral - Thin Teaspoon: Piecemeal swallowing;Weak lingual manipulation;Reduced posterior propulsion;Lingual/palatal residue Oral - Thin Cup: Piecemeal swallowing;Weak lingual manipulation;Reduced posterior propulsion;Lingual/palatal residue Oral - Thin Straw: Piecemeal swallowing;Reduced posterior propulsion;Lingual/palatal residue Oral - Solids Oral - Puree: Piecemeal swallowing;Weak lingual manipulation;Reduced posterior propulsion Oral - Regular: Piecemeal swallowing;Weak lingual manipulation;Reduced posterior propulsion;Impaired mastication Oral Phase - Comment Oral Phase - Comment: pt essentially allows boluses to spill into pharynx uncontrolled due to lingual weakness/discoordination   Pharyngeal Phase Pharyngeal Phase Pharyngeal Phase: Impaired Pharyngeal - Nectar Pharyngeal - Nectar Teaspoon: Delayed swallow initiation;Pharyngeal residue - pyriform sinuses;Lateral channel residue;Premature spillage to valleculae;Reduced tongue base retraction Pharyngeal - Nectar Cup: Lateral channel residue;Premature spillage to valleculae;Reduced tongue base retraction Pharyngeal - Thin Pharyngeal - Thin Teaspoon: Lateral channel residue;Pharyngeal residue - pyriform sinuses;Pharyngeal residue - valleculae;Premature spillage to valleculae;Reduced tongue base retraction Pharyngeal - Thin Cup: Pharyngeal residue - pyriform sinuses;Lateral channel residue Pharyngeal - Thin Straw: Pharyngeal residue - pyriform sinuses;Lateral channel residue Pharyngeal - Solids Pharyngeal - Puree: Delayed swallow initiation;Premature spillage to valleculae;Pharyngeal residue - valleculae;Pharyngeal residue - posterior pharnyx;Pharyngeal residue - pyriform  sinuses Pharyngeal - Regular: Premature spillage to valleculae;Delayed swallow initiation;Pharyngeal residue - valleculae Pharyngeal Phase - Comment Pharyngeal  Comment: dry swallows decrease residuals but do not eliminate them, pt's mother states pt was not cooperative  Cervical Esophageal Phase    GO    Cervical Esophageal Phase Cervical Esophageal Phase: Impaired Cervical Esophageal Phase - Nectar Nectar Teaspoon: Reduced cricopharyngeal relaxation Nectar Cup: Reduced cricopharyngeal relaxation Cervical Esophageal Phase - Thin Thin Teaspoon: Reduced cricopharyngeal relaxation Thin Cup: Reduced cricopharyngeal relaxation Thin Straw: Reduced cricopharyngeal relaxation Cervical Esophageal Phase - Solids Puree: Reduced cricopharyngeal relaxation Regular: Reduced cricopharyngeal relaxation Cervical Esophageal Phase - Comment Cervical Esophageal Comment: suspect hypertonic UES impacting barium clearance, appearance of intermittent stasis at distal esophagus with retrograde propulsion without pt awarness/sensation         Donavan Burnet, MS [facility] SLP [phone]

## 2012-03-29 NOTE — Progress Notes (Signed)
[facility] PROGRESS NOTE  Sydney Hatfield ZOX:[id number] DOB: August 25, 1955 DOA: 03/26/2012 PCP: Mickie Hillier, MD    Brief Narrative:  57 year old female with moderate mental retardation, autism, cervical dystonia, history of seizure disorder who apparently choked while eating at the group home and was brought to the ED 2 days back and initially planned for admission but signed out AMA by her mother. She did well for one day but was noted to be very short of breath and coughing and febrile. She was started on Levaquin by her PCP the next day. In the ED patient was noted to be hypoxic , tachycardic and febrile and admitted to step down monitoring for sepsis secondary to acute respiratory failure with possible aspiration pneumonia.   Assessment/Plan:  Sepsis with acute respiratory failure likely secondary to aspiration pneumonia  Patient admitted to step down and started on IV vancomycin , zosyn and Levaquin  -repeat CXR PA lateral shows bibasilar opacities. Patient febrile, tachycardic and tachypnic on 1/17. Was hypotensive and tachypnic overnight requiring 500 ccx 2 IV NS bolus. ABG was stable.  -afebrile past 24 hrs. Tachycardic with anxiety and cough. RR and sats normal on 1-2 L via [place].  -. -d/c clomipramine and give Depakote and klonopin at different intervals given low BP.  -Blood cx so far negative. Rapid flu negative. Legionella ag pending.  -antitussive for cough   Dysphagia with risk for aspiration  -seen by swallow eval and high risk for aspiration. Made NPO.  -. I have spoken with her neurologist Dr. Herminio Heads at [facility] on 1/17. 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 Her neurologist does not agree that aspiration is caused by effect of the botulinum toxin. Patient has received her third dose of Botulinum toxin this year and her neurologist feels the risk of aspiration with botulinum toxin is  not common.  -Continue head of bed elevation.  Patient failed MBS with high aspiration risk . Mother wants patient to eat  Despite high aspiration risk. Kept on pureed diet -Will continue necessary home medications with apple sauce.   History of seizure disorder  Depakote level is therapeutic. Continue current dose of Depakote.   History of mental retardation with agitation.  Continue her home dose of Klonopin. Continue Zoloft   Chronic torticollis  Patient has been taking botulinum toxin toxin injection every 3 months for past 9 months and follows with Dr. Herminio [facility] at [facility]. Last injection received 1 months back. Discussed plan with Dr Marya Fossa.   Iron Deficiency anemia  Continue iron supplements   Hypokalemia  replenish  Code Status: Full code  Family Communication: mother and caretaker at bedside  Disposition Plan: currently inpatient. transfer out to tele Consultants:  none Procedures:  none Antibiotics:  IV vnaco , zosyn and levaquin ( 1/17>>)     HPI/Subjective: Feels better overall. Failed MBS eval, but mother wants patient to eat despite high aspiration risk.  Objective: Filed Vitals:   03/29/12 1100 03/29/12 1200 03/29/12 1400 03/29/12 1600  BP: 106/67 113/68 121/95 110/59  Pulse: 107 110 110 101  Temp:  98.4 F (36.9 C)    TempSrc:  Oral    Resp: 26 22 23 24   Height:      Weight:      SpO2: 91% 96% 96% 95%    Intake/Output Summary (Last 24 hours) at 03/29/12 1647 Last data filed at 03/29/12 1600  Gross per 24 hour  Intake  3425 ml  Output   2200 ml  Net   1225 ml   Filed Weights   03/27/12 0400 03/28/12 0600 03/29/12 4540  Weight: 65.7 kg (144 lb 13.5 oz) 67.1 kg (147 lb 14.9 oz) 66 kg (145 lb 8.1 oz)    Exam:  General: Middle aged female lying in bed in NAD. Appears fatigued  HEENT: no pallor, moist mucosa  Cardiovascular: S1 and S2 Normal, no murmurs rub or gallop  Respiratory: Coarse breath sounds bilaterally,  Abdomen:  Soft, nontender, nondistended, bowel sounds present  Extremities: Warm, no edema  AAOX3, non focal   Data Reviewed: Basic Metabolic Panel:  Lab 03/29/12 9811 03/28/12 0354 03/27/12 0920 03/26/12 0055 03/22/12 2300  NA 136 133* 138 138 131*  K 3.2* 3.0* 3.1* 4.4 4.0  CL 102 101 107 102 94*  CO2 24 22 22 19 24   GLUCOSE 115* 94 90 87 95  BUN 11 7 10  24* 19  CREATININE 1.00 0.64 0.73 1.47* 1.32*  CALCIUM 8.6 8.4 8.2* 8.9 9.6  MG -- 1.3* -- -- --  PHOS -- -- -- -- --   Liver Function Tests:  Lab 03/26/12 0055 03/22/12 2300  AST 29 17  ALT 9 10  ALKPHOS 85 100  BILITOT 0.2* 0.3  PROT 6.7 7.4  ALBUMIN 2.7* 3.4*   No results found for this basename: LIPASE:5,AMYLASE:5 in the last 168 hours No results found for this basename: AMMONIA:5 in the last 168 hours CBC:  Lab 03/28/12 0354 03/26/12 0540 03/22/12 2300  WBC 5.7 4.5 12.2*  NEUTROABS -- 3.5 8.9*  HGB 10.2* 10.5* 11.4*  HCT 30.1* 32.2* 34.1*  MCV 86.2 90.2 88.6  PLT 169 174 189   Cardiac Enzymes: No results found for this basename: CKTOTAL:5,CKMB:5,CKMBINDEX:5,TROPONINI:5 in the last 168 hours BNP (last 3 results) No results found for this basename: PROBNP:3 in the last 8760 hours CBG:  Lab 03/26/12 0857  GLUCAP 72    Recent Results (from the past 240 hour(s))  CULTURE, BLOOD (ROUTINE X 2)     Status: Normal (Preliminary result)   Collection Time   03/26/12 12:55 AM      Component Value Range Status Comment   Specimen Description BLOOD HAC   Final    Special Requests NONE BOTTLES DRAWN AEROBIC AND ANAEROBIC 5CC   Final    Culture  Setup Time 03/26/2012 11:07   Final    Culture     Final    Value:        BLOOD CULTURE RECEIVED NO GROWTH TO DATE CULTURE WILL BE HELD FOR 5 DAYS BEFORE ISSUING A FINAL NEGATIVE REPORT   Report Status PENDING   Incomplete   URINE CULTURE     Status: Normal   Collection Time   03/26/12  1:26 AM      Component Value Range Status Comment   Specimen Description URINE, CATHETERIZED    Final    Special Requests NONE   Final    Culture  Setup Time 03/26/2012 14:22   Final    Colony Count NO GROWTH   Final    Culture NO GROWTH   Final    Report Status 03/27/2012 FINAL   Final   MRSA PCR SCREENING     Status: Normal   Collection Time   03/26/12  4:08 AM      Component Value Range Status Comment   MRSA by PCR NEGATIVE  NEGATIVE Final   CULTURE, BLOOD (ROUTINE X 2)  Status: Normal (Preliminary result)   Collection Time   03/26/12  5:30 AM      Component Value Range Status Comment   Specimen Description BLOOD LEFT HAND   Final    Special Requests BOTTLES DRAWN AEROBIC ONLY 1CC   Final    Culture  Setup Time 03/26/2012 11:07   Final    Culture     Final    Value:        BLOOD CULTURE RECEIVED NO GROWTH TO DATE CULTURE WILL BE HELD FOR 5 DAYS BEFORE ISSUING A FINAL NEGATIVE REPORT   Report Status PENDING   Incomplete   CULTURE, EXPECTORATED SPUTUM-ASSESSMENT     Status: Normal   Collection Time   03/26/12  7:51 AM      Component Value Range Status Comment   Specimen Description SPUTUM   Final    Special Requests Normal   Final    Sputum evaluation     Final    Value: THIS SPECIMEN IS ACCEPTABLE. RESPIRATORY CULTURE REPORT TO FOLLOW.   Report Status 03/26/2012 FINAL   Final   CULTURE, RESPIRATORY     Status: Normal   Collection Time   03/26/12  7:51 AM      Component Value Range Status Comment   Specimen Description SPUTUM   Final    Special Requests NONE   Final    Gram Stain     Final    Value: ABUNDANT WBC PRESENT,BOTH PMN AND MONONUCLEAR     FEW SQUAMOUS EPITHELIAL CELLS PRESENT     RARE GRAM POSITIVE COCCI     IN PAIRS RARE GRAM POSITIVE RODS     RARE GRAM NEGATIVE RODS   Culture NORMAL OROPHARYNGEAL FLORA   Final    Report Status 03/28/2012 FINAL   Final      Studies: Dg Swallowing Func-speech Pathology  03/29/2012  Chales Abrahams, CCC-SLP     03/29/2012 11:38 AM Objective Swallowing Evaluation: Modified Barium Swallowing Study   Patient Details   Name: Sydney Hatfield MRN: [id number] Date of Birth: Aug 16, 1955  Today's Date: 03/29/2012 Time: 1005-1055 SLP Time Calculation (min): 50 min  Past Medical History:  Past Medical History  Diagnosis Date  . Autistic spectrum disorder   . Anxiety disorder   . Seizure   . MR (mental retardation), moderate   . Torticollis    Past Surgical History:  Past Surgical History  Procedure Date  . Colonoscopy   . Breast biopsy    HPI:  57 yo female h/o moderate mental retardation, autism, seizure  disorder 2 days ago at her group home choked while eating some  stringy cheese someone had to do the hemlick maneuver on her  which was successful.  She was brought to the ED and evaluated  and was going to be admitted but her mother signed her out AMA  back to the group home.  Pt then admitted next date with sob and  coughing and fever diagnosed with pna.  Pt is now on 4 liters of  oxygen after requiring NR previously.  Pt does becomes tachypneic  with exertion.  H/O spasmodic torticollis present and pt receives  botox injections every 3 months.     Assessment / Plan / Recommendation Clinical Impression  Dysphagia Diagnosis: Moderate oral phase dysphagia;Moderate  pharyngeal phase dysphagia;Moderate cervical esophageal phase  dysphagia  Clinical impression: Moderate oropharyngeal and suspected  cervical esophageal dysphagia present suspected due to  torticollis, MR and deconditioning.    Dysphagia characterized  by weakness/decr oral control resulting  in premature spillage of barium into pharynx and decr tongue base  retraction resulting in pharyngeal stasis whic pt does not sense.   Dry swallows help to decrease pharyngeal residuals but do not  clear them and pt did not follow directions to conduct further  dry swallows.  Appearance of decreased UES opening, ? 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   Fortunately pt has large pyriform sinuses that gathers barium.   Please note, pt coughed throughout entire  MBS and barium was not  seen in larynx or trachea.  Her aspiration risk remains high due  to her sensoriomotor dysphagia, decreased ability to follow  directions to dry swallow to clear residuals and deconditioning.      Based on report from her mother that the pt will cough on  occasion with intake and h/o MBS completion years prior, suspect  chronic dysphagia that pt typically manages.  Mother states she  would like pt to consume intake with KNOWN risks (recurrent  pulmonary infections, aspiration) and strategies to maximiize  airway protection with MD permission.  Pt is particular about  what she eats but eating gives her QOL per mother.  Educated  mother to importance of monitoring po tolerance, dry swallows to  decr residuals, assuring pt not tachypenic, etc with intake.    Please note:  Pt's head was in chin tuck position as she did not  hold it up even with max cues.  May be beneficial for airway  protection.  Unable to assess swallow in head neutral, which is  position mother states is pt's normal position.   Will inform MD to results and mother's desire and will follow.      Treatment Recommendation  Therapy as outlined in treatment plan below   Diet Recommendation NPO      OR       Puree/thin (currently) if  MD and mother agree with acceptance of aspiration risks    If pt to initiate diet recommend the following strategies  Multiple swallows with EACH bite/sip  Follow solids with liquids Chin tuck position Crush medicine given with applesauce - follow with water Rest breaks if with tachypnea or overtly coughing Sit upright for at least 30 minutes after meals      Other  Recommendations Oral Care Recommendations: Oral care QID   Follow Up Recommendations  Other (comment) (TBD) TBD   Frequency and Duration min 2x/week  2 weeks   Pertinent Vitals/Pain Low grade temp, lungs decreased- WEAK  nonproductive cough    SLP Swallow Goals Patient will utilize recommended strategies during swallow to  increase  swallowing safety with: Total assistance Goal #3: Pt's mother/MD will determine if pt to consume po with  accepted asp risks and strategies in place to minimize aspiration  independently.    General Date of Onset: 03/15/12 HPI: 57 yo female h/o moderate mental retardation, autism,  seizure disorder 2 days ago at her group home choked while eating  some stringy cheese someone had to do the hemlick maneuver on her  which was successful.  She was brought to the ED and evaluated  and was going to be admitted but her mother signed her out AMA  back to the group home.  Pt then admitted next date with sob and  coughing and fever diagnosed with pna.  Pt is now on 4 liters of  oxygen after requiring NR previously.  Pt does becomes tachypneic  with  exertion.  H/O spasmodic torticollis present and pt receives  botox injections every 3 months. Type of Study: Modified Barium Swallowing Study Reason for Referral: Objectively evaluate swallowing function Previous Swallow Assessment: Mother reports patient has had a MBS  several years ago, but not in the [facility]. Diet Prior to this Study: NPO Respiratory Status: Supplemental O2 delivered via (comment) (NRB  mask 50%) History of Recent Intubation: No Behavior/Cognition: Alert;Distractible;Requires cueing;Doesn't  follow directions;Decreased sustained attention;Impulsive Oral Cavity - Dentition: Adequate natural dentition Oral Motor / Sensory Function: Impaired - see Bedside swallow  eval Self-Feeding Abilities: Able to feed self;Needs assist Patient Positioning: Upright in chair Baseline Vocal Quality: Low vocal intensity Volitional Cough: Cognitively unable to elicit Volitional Swallow: Unable to elicit Anatomy: Other (Comment) (difficult to view due to motion  degradation)    Reason for Referral Objectively evaluate swallowing function   Oral Phase Oral Preparation/Oral Phase Oral Phase: Impaired Oral - Nectar Oral - Nectar Teaspoon: Piecemeal swallowing;Weak lingual   manipulation;Reduced posterior propulsion Oral - Nectar Cup: Piecemeal swallowing;Weak lingual  manipulation;Reduced posterior propulsion Oral - Thin Oral - Thin Teaspoon: Piecemeal swallowing;Weak lingual  manipulation;Reduced posterior propulsion;Lingual/palatal residue Oral - Thin Cup: Piecemeal swallowing;Weak lingual  manipulation;Reduced posterior propulsion;Lingual/palatal residue Oral - Thin Straw: Piecemeal swallowing;Reduced posterior  propulsion;Lingual/palatal residue Oral - Solids Oral - Puree: Piecemeal swallowing;Weak lingual  manipulation;Reduced posterior propulsion Oral - Regular: Piecemeal swallowing;Weak lingual  manipulation;Reduced posterior propulsion;Impaired mastication Oral Phase - Comment Oral Phase - Comment: pt essentially allows boluses to spill into  pharynx uncontrolled due to lingual weakness/discoordination   Pharyngeal Phase Pharyngeal Phase Pharyngeal Phase: Impaired Pharyngeal - Nectar Pharyngeal - Nectar Teaspoon: Delayed swallow  initiation;Pharyngeal residue - pyriform sinuses;Lateral channel  residue;Premature spillage to valleculae;Reduced tongue base  retraction Pharyngeal - Nectar Cup: Lateral channel residue;Premature  spillage to valleculae;Reduced tongue base retraction Pharyngeal - Thin Pharyngeal - Thin Teaspoon: Lateral channel residue;Pharyngeal  residue - pyriform sinuses;Pharyngeal residue -  valleculae;Premature spillage to valleculae;Reduced tongue base  retraction Pharyngeal - Thin Cup: Pharyngeal residue - pyriform  sinuses;Lateral channel residue Pharyngeal - Thin Straw: Pharyngeal residue - pyriform  sinuses;Lateral channel residue Pharyngeal - Solids Pharyngeal - Puree: Delayed swallow initiation;Premature spillage  to valleculae;Pharyngeal residue - valleculae;Pharyngeal residue  - posterior pharnyx;Pharyngeal residue - pyriform sinuses Pharyngeal - Regular: Premature spillage to valleculae;Delayed  swallow initiation;Pharyngeal residue - valleculae  Pharyngeal Phase - Comment Pharyngeal Comment: dry swallows decrease residuals but do not  eliminate them, pt's mother states pt was not cooperative  Cervical Esophageal Phase    GO    Cervical Esophageal Phase Cervical Esophageal Phase: Impaired Cervical Esophageal Phase - Nectar Nectar Teaspoon: Reduced cricopharyngeal relaxation Nectar Cup: Reduced cricopharyngeal relaxation Cervical Esophageal Phase - Thin Thin Teaspoon: Reduced cricopharyngeal relaxation Thin Cup: Reduced cricopharyngeal relaxation Thin Straw: Reduced cricopharyngeal relaxation Cervical Esophageal Phase - Solids Puree: Reduced cricopharyngeal relaxation Regular: Reduced cricopharyngeal relaxation Cervical Esophageal Phase - Comment Cervical Esophageal Comment: 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, appearance of intermittent stasis at distal  esophagus with retrograde propulsion without pt  awarness/sensation         Donavan Burnet, MS [facility] SLP [phone]      Scheduled Meds:   . antiseptic oral rinse  15 mL Mouth Rinse BID  . aspirin EC  81 mg Oral Daily  . clonazePAM  0.5 mg Oral TID  . clonazePAM  1 mg Oral Daily  . enoxaparin (LOVENOX) injection  40 mg Subcutaneous Q24H  . levofloxacin (LEVAQUIN) IV  750 mg Intravenous  Q24H  . pantoprazole  40 mg Oral Daily  . piperacillin-tazobactam (ZOSYN)  IV  3.375 g Intravenous Q8H  . sertraline  200 mg Oral Daily  . valproate sodium  500 mg Intravenous Q12H  . vancomycin  1,000 mg Intravenous Q12H   Continuous Infusions:   . dextrose 5 % and 0.9% NaCl 100 mL/hr at 03/29/12 1330      Time spent: 25 minutes    Tabbetha Kutscher  [facility] Pager [phone]. If 8PM-8AM, please contact night-coverage at [email or web address], password [facility] 03/29/2012, 4:47 PM  LOS: 3 days

## 2012-03-29 NOTE — Progress Notes (Signed)
Patient transferring to room 1402.  Report called to Pattricia Boss, Charity fundraiser.  Patient to travel by bed.  Will continue to monitor.

## 2012-03-30 LAB — BASIC METABOLIC PANEL
BUN: 8 mg/dL (ref 6–23)
Chloride: 108 mEq/L (ref 96–112)
GFR calc Af Amer: 73 mL/min — ABNORMAL LOW (ref >90)
GFR calc non Af Amer: 63 mL/min — ABNORMAL LOW (ref >90)
Potassium: 3.7 mEq/L (ref 3.5–5.1)
Sodium: 140 mEq/L (ref 135–145)

## 2012-03-30 NOTE — Progress Notes (Signed)
[facility] PROGRESS NOTE  STEFFI NOVIELLO ZOX:[id number] DOB: 1955/07/22 DOA: 03/26/2012 PCP: Mickie Hillier, MD  Brief Narrative:  57 year old female with moderate mental retardation, autism, cervical dystonia, history of seizure disorder who apparently choked while eating at the group home and was brought to the ED 2 days back and initially planned for admission but signed out AMA by her mother. She did well for one day but was noted to be very short of breath and coughing and febrile. She was started on Levaquin by her PCP the next day. In the ED patient was noted to be hypoxic , tachycardic and febrile and admitted to step down monitoring for sepsis secondary to acute respiratory failure with possible aspiration pneumonia.    Assessment/Plan:  Sepsis with acute respiratory failure likely secondary to aspiration pneumonia  Patient admitted to step down and started on IV vancomycin , zosyn and Levaquin  -repeat CXR PA lateral shows bibasilar opacities. Patient febrile, tachycardic and tachypnic on 1/17. Was hypotensive and tachypnic overnight on day 2 requiring 500 ccx 2 IV NS bolus. ABG was stable.  -afebrile past 48 hrs. . RR and sats normal  -. -d/c clomipramine and give Depakote and klonopin at different intervals given low BP.  -Blood cx  negative. Rapid flu negative. Legionella ag pending.  -antitussive for cough    Dysphagia with risk for aspiration  -seen by swallow eval and high risk for aspiration. Made NPO.  -. I have spoken with her neurologist Dr. Herminio Heads at [facility] on 1/17. 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 Her neurologist does not agree that aspiration is caused by effect of the botulinum toxin. Patient has received her third dose of Botulinum toxin this year and her neurologist feels the risk of aspiration with botulinum toxin is not common.  -Continue head of bed elevation.    Patient failed MBS with high aspiration risk . Mother wants patient to eat Despite high aspiration risk. ( unable to decide on alternate means of feeding)  Kept on pureed diet .   History of seizure disorder  Depakote level is therapeutic. Continue current dose of Depakote.   History of mental retardation with agitation.  Continue her home dose of Klonopin. Continue Zoloft   Chronic torticollis  Patient has been taking botulinum toxin toxin injection every 3 months for past 9 months and follows with Dr. Herminio Heads at [facility]. Last injection received 1 months back. Discussed plan with Dr Marya Fossa.   Iron Deficiency anemia  Continue iron supplements   Hypokalemia  replenish   Code Status: Full code   Family Communication: mother  at bedside  Disposition Plan: currently inpatient.   Consultants:  none Procedures:  none Antibiotics:  IV vnaco , zosyn and levaquin ( 1/17>>) , narrow abx to Levaquin only if afebrile tomorrow   HPI/Subjective:  Feels better overall. Failed MBS eval. afebrile  Objective: Filed Vitals:   03/30/12 0514 03/30/12 0518 03/30/12 0900 03/30/12 1300  BP: 152/88 149/85  129/79  Pulse: 103   101  Temp: 97.5 F (36.4 C)   98 F (36.7 C)  TempSrc: Oral   Oral  Resp: 20   22  Height:      Weight:      SpO2: 99%  91% 99%    Intake/Output Summary (Last 24 hours) at 03/30/12 1359 Last data filed at 03/30/12 0519  Gross per 24 hour  Intake  797.5 ml  Output   2550 ml  Net -1752.5 ml   Filed Weights   03/28/12 0600 03/29/12 0642 03/29/12 1945  Weight: 67.1 kg (147 lb 14.9 oz) 66 kg (145 lb 8.1 oz) 64.4 kg (141 lb 15.6 oz)    Exam:  General: Middle aged female lying in bed in NAD. Appears fatigued  HEENT: no pallor, moist mucosa  Cardiovascular: S1 and S2 Normal, no murmurs rub or gallop  Respiratory: Coarse breath sounds bilaterally,  Abdomen: Soft, nontender, nondistended, bowel sounds present  Extremities: Warm, no edema   AAOX3, chronic cervical dystonia   Data Reviewed: Basic Metabolic Panel:  Lab 03/30/12 1610 03/29/12 0346 03/28/12 0354 03/27/12 0920 03/26/12 0055  NA 140 136 133* 138 138  K 3.7 3.2* 3.0* 3.1* 4.4  CL 108 102 101 107 102  CO2 21 24 22 22 19   GLUCOSE 87 115* 94 90 87  BUN 8 11 7 10  24*  CREATININE 0.98 1.00 0.64 0.73 1.47*  CALCIUM 8.7 8.6 8.4 8.2* 8.9  MG -- -- 1.3* -- --  PHOS -- -- -- -- --   Liver Function Tests:  Lab 03/26/12 0055  AST 29  ALT 9  ALKPHOS 85  BILITOT 0.2*  PROT 6.7  ALBUMIN 2.7*   No results found for this basename: LIPASE:5,AMYLASE:5 in the last 168 hours No results found for this basename: AMMONIA:5 in the last 168 hours CBC:  Lab 03/28/12 0354 03/26/12 0540  WBC 5.7 4.5  NEUTROABS -- 3.5  HGB 10.2* 10.5*  HCT 30.1* 32.2*  MCV 86.2 90.2  PLT 169 174   Cardiac Enzymes: No results found for this basename: CKTOTAL:5,CKMB:5,CKMBINDEX:5,TROPONINI:5 in the last 168 hours BNP (last 3 results) No results found for this basename: PROBNP:3 in the last 8760 hours CBG:  Lab 03/26/12 0857  GLUCAP 72    Recent Results (from the past 240 hour(s))  CULTURE, BLOOD (ROUTINE X 2)     Status: Normal (Preliminary result)   Collection Time   03/26/12 12:55 AM      Component Value Range Status Comment   Specimen Description BLOOD HAC   Final    Special Requests NONE BOTTLES DRAWN AEROBIC AND ANAEROBIC 5CC   Final    Culture  Setup Time 03/26/2012 11:07   Final    Culture     Final    Value:        BLOOD CULTURE RECEIVED NO GROWTH TO DATE CULTURE WILL BE HELD FOR 5 DAYS BEFORE ISSUING A FINAL NEGATIVE REPORT   Report Status PENDING   Incomplete   URINE CULTURE     Status: Normal   Collection Time   03/26/12  1:26 AM      Component Value Range Status Comment   Specimen Description URINE, CATHETERIZED   Final    Special Requests NONE   Final    Culture  Setup Time 03/26/2012 14:22   Final    Colony Count NO GROWTH   Final    Culture NO GROWTH   Final     Report Status 03/27/2012 FINAL   Final   MRSA PCR SCREENING     Status: Normal   Collection Time   03/26/12  4:08 AM      Component Value Range Status Comment   MRSA by PCR NEGATIVE  NEGATIVE Final   CULTURE, BLOOD (ROUTINE X 2)     Status: Normal (Preliminary result)   Collection Time   03/26/12  5:30 AM      Component  Value Range Status Comment   Specimen Description BLOOD LEFT HAND   Final    Special Requests BOTTLES DRAWN AEROBIC ONLY 1CC   Final    Culture  Setup Time 03/26/2012 11:07   Final    Culture     Final    Value:        BLOOD CULTURE RECEIVED NO GROWTH TO DATE CULTURE WILL BE HELD FOR 5 DAYS BEFORE ISSUING A FINAL NEGATIVE REPORT   Report Status PENDING   Incomplete   CULTURE, EXPECTORATED SPUTUM-ASSESSMENT     Status: Normal   Collection Time   03/26/12  7:51 AM      Component Value Range Status Comment   Specimen Description SPUTUM   Final    Special Requests Normal   Final    Sputum evaluation     Final    Value: THIS SPECIMEN IS ACCEPTABLE. RESPIRATORY CULTURE REPORT TO FOLLOW.   Report Status 03/26/2012 FINAL   Final   CULTURE, RESPIRATORY     Status: Normal   Collection Time   03/26/12  7:51 AM      Component Value Range Status Comment   Specimen Description SPUTUM   Final    Special Requests NONE   Final    Gram Stain     Final    Value: ABUNDANT WBC PRESENT,BOTH PMN AND MONONUCLEAR     FEW SQUAMOUS EPITHELIAL CELLS PRESENT     RARE GRAM POSITIVE COCCI     IN PAIRS RARE GRAM POSITIVE RODS     RARE GRAM NEGATIVE RODS   Culture NORMAL OROPHARYNGEAL FLORA   Final    Report Status 03/28/2012 FINAL   Final      Studies: Dg Swallowing Func-speech Pathology  03/29/2012  Chales Abrahams, CCC-SLP     03/29/2012 11:38 AM Objective Swallowing Evaluation: Modified Barium Swallowing Study   Patient Details  Name: ZNYA ALBINO MRN: [id number] Date of Birth: 1955-03-27  Today's Date: 03/29/2012 Time: 1005-1055 SLP Time Calculation (min): 50 min  Past Medical  History:  Past Medical History  Diagnosis Date  . Autistic spectrum disorder   . Anxiety disorder   . Seizure   . MR (mental retardation), moderate   . Torticollis    Past Surgical History:  Past Surgical History  Procedure Date  . Colonoscopy   . Breast biopsy    HPI:  57 yo female h/o moderate mental retardation, autism, seizure  disorder 2 days ago at her group home choked while eating some  stringy cheese someone had to do the hemlick maneuver on her  which was successful.  She was brought to the ED and evaluated  and was going to be admitted but her mother signed her out AMA  back to the group home.  Pt then admitted next date with sob and  coughing and fever diagnosed with pna.  Pt is now on 4 liters of  oxygen after requiring NR previously.  Pt does becomes tachypneic  with exertion.  H/O spasmodic torticollis present and pt receives  botox injections every 3 months.     Assessment / Plan / Recommendation Clinical Impression  Dysphagia Diagnosis: Moderate oral phase dysphagia;Moderate  pharyngeal phase dysphagia;Moderate cervical esophageal phase  dysphagia  Clinical impression: Moderate oropharyngeal and suspected  cervical esophageal dysphagia present suspected due to  torticollis, MR and deconditioning.    Dysphagia characterized by weakness/decr oral control resulting  in premature spillage of barium into pharynx and decr tongue base  retraction resulting  in pharyngeal stasis whic pt does not sense.   Dry swallows help to decrease pharyngeal residuals but do not  clear them and pt did not follow directions to conduct further  dry swallows.  Appearance of decreased UES opening, ? 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   Fortunately pt has large pyriform sinuses that gathers barium.   Please note, pt coughed throughout entire MBS and barium was not  seen in larynx or trachea.  Her aspiration risk remains high due  to her sensoriomotor dysphagia, decreased ability to follow   directions to dry swallow to clear residuals and deconditioning.      Based on report from her mother that the pt will cough on  occasion with intake and h/o MBS completion years prior, suspect  chronic dysphagia that pt typically manages.  Mother states she  would like pt to consume intake with KNOWN risks (recurrent  pulmonary infections, aspiration) and strategies to maximiize  airway protection with MD permission.  Pt is particular about  what she eats but eating gives her QOL per mother.  Educated  mother to importance of monitoring po tolerance, dry swallows to  decr residuals, assuring pt not tachypenic, etc with intake.    Please note:  Pt's head was in chin tuck position as she did not  hold it up even with max cues.  May be beneficial for airway  protection.  Unable to assess swallow in head neutral, which is  position mother states is pt's normal position.   Will inform MD to results and mother's desire and will follow.      Treatment Recommendation  Therapy as outlined in treatment plan below   Diet Recommendation NPO      OR       Puree/thin (currently) if  MD and mother agree with acceptance of aspiration risks    If pt to initiate diet recommend the following strategies  Multiple swallows with EACH bite/sip  Follow solids with liquids Chin tuck position Crush medicine given with applesauce - follow with water Rest breaks if with tachypnea or overtly coughing Sit upright for at least 30 minutes after meals      Other  Recommendations Oral Care Recommendations: Oral care QID   Follow Up Recommendations  Other (comment) (TBD) TBD   Frequency and Duration min 2x/week  2 weeks   Pertinent Vitals/Pain Low grade temp, lungs decreased- WEAK  nonproductive cough    SLP Swallow Goals Patient will utilize recommended strategies during swallow to  increase swallowing safety with: Total assistance Goal #3: Pt's mother/MD will determine if pt to consume po with  accepted asp risks and strategies in place to  minimize aspiration  independently.    General Date of Onset: 03/15/12 HPI: 57 yo female h/o moderate mental retardation, autism,  seizure disorder 2 days ago at her group home choked while eating  some stringy cheese someone had to do the hemlick maneuver on her  which was successful.  She was brought to the ED and evaluated  and was going to be admitted but her mother signed her out AMA  back to the group home.  Pt then admitted next date with sob and  coughing and fever diagnosed with pna.  Pt is now on 4 liters of  oxygen after requiring NR previously.  Pt does becomes tachypneic  with exertion.  H/O spasmodic torticollis present and pt receives  botox injections every 3 months. Type of Study: Modified Barium  Swallowing Study Reason for Referral: Objectively evaluate swallowing function Previous Swallow Assessment: Mother reports patient has had a MBS  several years ago, but not in the [facility]. Diet Prior to this Study: NPO Respiratory Status: Supplemental O2 delivered via (comment) (NRB  mask 50%) History of Recent Intubation: No Behavior/Cognition: Alert;Distractible;Requires cueing;Doesn't  follow directions;Decreased sustained attention;Impulsive Oral Cavity - Dentition: Adequate natural dentition Oral Motor / Sensory Function: Impaired - see Bedside swallow  eval Self-Feeding Abilities: Able to feed self;Needs assist Patient Positioning: Upright in chair Baseline Vocal Quality: Low vocal intensity Volitional Cough: Cognitively unable to elicit Volitional Swallow: Unable to elicit Anatomy: Other (Comment) (difficult to view due to motion  degradation)    Reason for Referral Objectively evaluate swallowing function   Oral Phase Oral Preparation/Oral Phase Oral Phase: Impaired Oral - Nectar Oral - Nectar Teaspoon: Piecemeal swallowing;Weak lingual  manipulation;Reduced posterior propulsion Oral - Nectar Cup: Piecemeal swallowing;Weak lingual  manipulation;Reduced posterior propulsion Oral - Thin Oral -  Thin Teaspoon: Piecemeal swallowing;Weak lingual  manipulation;Reduced posterior propulsion;Lingual/palatal residue Oral - Thin Cup: Piecemeal swallowing;Weak lingual  manipulation;Reduced posterior propulsion;Lingual/palatal residue Oral - Thin Straw: Piecemeal swallowing;Reduced posterior  propulsion;Lingual/palatal residue Oral - Solids Oral - Puree: Piecemeal swallowing;Weak lingual  manipulation;Reduced posterior propulsion Oral - Regular: Piecemeal swallowing;Weak lingual  manipulation;Reduced posterior propulsion;Impaired mastication Oral Phase - Comment Oral Phase - Comment: pt essentially allows boluses to spill into  pharynx uncontrolled due to lingual weakness/discoordination   Pharyngeal Phase Pharyngeal Phase Pharyngeal Phase: Impaired Pharyngeal - Nectar Pharyngeal - Nectar Teaspoon: Delayed swallow  initiation;Pharyngeal residue - pyriform sinuses;Lateral channel  residue;Premature spillage to valleculae;Reduced tongue base  retraction Pharyngeal - Nectar Cup: Lateral channel residue;Premature  spillage to valleculae;Reduced tongue base retraction Pharyngeal - Thin Pharyngeal - Thin Teaspoon: Lateral channel residue;Pharyngeal  residue - pyriform sinuses;Pharyngeal residue -  valleculae;Premature spillage to valleculae;Reduced tongue base  retraction Pharyngeal - Thin Cup: Pharyngeal residue - pyriform  sinuses;Lateral channel residue Pharyngeal - Thin Straw: Pharyngeal residue - pyriform  sinuses;Lateral channel residue Pharyngeal - Solids Pharyngeal - Puree: Delayed swallow initiation;Premature spillage  to valleculae;Pharyngeal residue - valleculae;Pharyngeal residue  - posterior pharnyx;Pharyngeal residue - pyriform sinuses Pharyngeal - Regular: Premature spillage to valleculae;Delayed  swallow initiation;Pharyngeal residue - valleculae Pharyngeal Phase - Comment Pharyngeal Comment: dry swallows decrease residuals but do not  eliminate them, pt's mother states pt was not cooperative  Cervical  Esophageal Phase    GO    Cervical Esophageal Phase Cervical Esophageal Phase: Impaired Cervical Esophageal Phase - Nectar Nectar Teaspoon: Reduced cricopharyngeal relaxation Nectar Cup: Reduced cricopharyngeal relaxation Cervical Esophageal Phase - Thin Thin Teaspoon: Reduced cricopharyngeal relaxation Thin Cup: Reduced cricopharyngeal relaxation Thin Straw: Reduced cricopharyngeal relaxation Cervical Esophageal Phase - Solids Puree: Reduced cricopharyngeal relaxation Regular: Reduced cricopharyngeal relaxation Cervical Esophageal Phase - Comment Cervical Esophageal Comment: 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, appearance of intermittent stasis at distal  esophagus with retrograde propulsion without pt  awarness/sensation         Donavan Burnet, MS [facility] SLP [phone]      Scheduled Meds:   . antiseptic oral rinse  15 mL Mouth Rinse BID  . aspirin EC  81 mg Oral Daily  . clonazePAM  0.5 mg Oral TID  . clonazePAM  1 mg Oral Daily  . enoxaparin (LOVENOX) injection  40 mg Subcutaneous Q24H  . levofloxacin (LEVAQUIN) IV  750 mg Intravenous Q24H  . pantoprazole  40 mg Oral Daily  . piperacillin-tazobactam (ZOSYN)  IV  3.375 g Intravenous Q8H  .  sertraline  200 mg Oral Daily  . valproate sodium  500 mg Intravenous Q12H   Continuous Infusions:   . dextrose 5 % and 0.9% NaCl 100 mL/hr at 03/30/12 0254      Time spent: 25 minutes    Dustine Stickler  [facility] Pager [phone] If 8PM-8AM, please contact night-coverage at [email or web address], password [facility] 03/30/2012, 1:59 PM  LOS: 4 days

## 2012-03-30 NOTE — Progress Notes (Signed)
Speech Language Pathology Dysphagia Treatment Patient Details Name: Sydney Hatfield MRN: [id number] DOB: January 24, 1956 Today's Date: 03/30/2012 Time: 1191-4782 SLP Time Calculation (min): 25 min  Assessment / Plan / Recommendation Clinical Impression  Pt seen for education of pt and mother re: strategies to decrease amount she aspirates during intake.   Rn from yesterday stated pt presented with coughing after consumption of small amount of icecream.  Today pt acknowledges "some coughing" when eating breakfast.  Mother present and SLP educated pt and mother to strategies to decr amount pt aspirates.  Unfortuantely pt did not conduct dry swallows  upon cue (? if due to neuromotor deficits and/or cognition) which will increase her aspiration risk.  Pt consumed a few sips of cola in chin tuck position via straw stating "That's good".  Delayed overt coughing noted after 3 sips - suspect due to aspiration based on MBS yesterday.     Although pt has not had pna (prior to now) for 2 years, aspiration and aspiration pna. will be chronic risk.  Pt today acknowledges coughing some with intake prior to admission and mother reports she pats her chest at times to help clear.  Pt has likely been tolerating episodic aspiration until this event.    SLP to continue to follow for education and to aid in maximizing airway protection.      Diet Recommendation  Initiate / Change Diet: Dysphagia 1 (puree);Thin liquid (WITH RISKS ACCEPTED)    SLP Plan Continue with current plan of care   Pertinent Vitals/Pain Afebrile, decreased- non productive cough, intake amount not documented but mother reports she consumed soup for dinner last pm   Swallowing Goals  SLP Swallowing Goals Patient will utilize recommended strategies during swallow to increase swallowing safety with: Total assistance Swallow Study Goal #3 - Progress: Met  General Temperature Spikes Noted: No Respiratory Status: Room air Behavior/Cognition:  Alert;Cooperative;Pleasant mood;Impulsive (doesn't follow directions consistently) Patient Positioning: Upright in chair (in chair, leaned forward- chin tuck posture)    Dysphagia Treatment Treatment focused on: Skilled observation of diet tolerance;Patient/family/caregiver education Family/Caregiver Educated: Patient's mother Treatment Methods/Modalities: Skilled observation Patient observed directly with PO's: Yes Type of PO's observed: Thin liquids Feeding: Total assist Liquids provided via: Straw Pharyngeal Phase Signs & Symptoms: Delayed cough (delayed coughing after 3 straw sips of coke) Type of cueing: Verbal Amount of cueing: Total (pt not conducting dry swallows)   GO     Donavan Burnet, MS [facility] SLP [phone]

## 2012-03-30 NOTE — Progress Notes (Signed)
Physical Therapy Treatment Patient Details Name: MAREE AINLEY MRN: [id number] DOB: 02-13-1956 Today's Date: 03/30/2012 Time: 7829-5621 PT Time Calculation (min): 24 min  PT Assessment / Plan / Recommendation Comments on Treatment Session  Pt has a Hx MR and can get easily aggitated. Was able to assist pt OOB because she wanted to sit on the couch and drink a coke.  Pt reqires + 2 assist for safety for amb.  Amb pt in hallway, looking for the couch when she stated, "I hate walking".     Follow Up Recommendations  Home health PT;Supervision/Assistance - 24 hour     Does the patient have the potential to tolerate intense rehabilitation     Barriers to Discharge        Equipment Recommendations  Rolling walker with 5" wheels    Recommendations for Other Services    Frequency Min 3X/week   Plan Discharge plan remains appropriate    Precautions / Restrictions Precautions Precautions: Fall Precaution Comments: Hx MR Restrictions Weight Bearing Restrictions: No   Pertinent Vitals/Pain No c/o pain    Mobility  Bed Mobility Bed Mobility: Supine to Sit;Sitting - Scoot to Edge of Bed Supine to Sit: 3: Mod assist Sitting - Scoot to Edge of Bed: 3: Mod assist Details for Bed Mobility Assistance: Max encouragement and HOB elevated 45'.   Transfers Transfers: Sit to Stand;Stand to Sit Sit to Stand: 1: +2 Total assist Sit to Stand: Patient Percentage: 80% Stand to Sit: 1: +2 Total assist Stand to Sit: Patient Percentage: 80% Details for Transfer Assistance: Pt prefers to pull self up vs push up despite VC's.  Ambulation/Gait Ambulation/Gait Assistance: 1: +2 Total assist Ambulation Distance (Feet): 118 Feet Assistive device: Rolling walker Ambulation/Gait Assistance Details: Keeping pt pleasant and calm during session. Pt wanted to sit on the couch and drink a Coke, so told her we would go to the couch as we directed her to the hallway pt said "I don't like to walk".  Pt  required + 2 for safety as she can get aggitated.  Was able to amb her in  the hallway then back to her room. Gait Pattern: Step-through pattern;Trunk flexed;Decreased stride length;Shuffle Gait velocity: poor kyphotic posture General Gait Details: HIGH FALL RISK     PT Goals                                                       progressing    Visit Information  Last PT Received On: 03/30/12 Assistance Needed: +2    Subjective Data  Subjective: I want a coke and sit on the couch Patient Stated Goal: home to mom   Cognition       Balance     End of Session PT - End of Session Equipment Utilized During Treatment: Gait belt Activity Tolerance: Patient limited by fatigue Patient left: in chair;with call bell/phone within reach;Other (comment) (personal Aide in room) Nurse Communication: Mobility status   Felecia Shelling  PTA [facility]  Acute  Rehab Pager     [phone]

## 2012-03-30 NOTE — Progress Notes (Signed)
Just received call from Craige Cotta in Lab at 04:25. Amy stated she just finished resulting patient's Vanc trough level and put it in computer. She put in the level as I logged on to Epic. Level was 25.9. I hung a bag of Vanc for patient at 0258 that was due at 0200 and told Amy this while on the phone. Called pharmacy. Beth in Pharmacy confirmed that Lab drew trough at 0127 and resulted very late. Beth said they will change the patient's dose and there's no need for another trough. Will continue to monitor patient. - Christell Faith, RN

## 2012-03-30 NOTE — Progress Notes (Signed)
Report received from Tess Blaine, RN. No change from initial am assessment. Will continue to follow the plan of care.  

## 2012-03-30 NOTE — Progress Notes (Signed)
ANTIBIOTIC CONSULT NOTE - FOLLOW UP  Pharmacy Consult for Vancomycin, Zosyn, renal adjustment of antibiotics Indication: sepsis from aspiration PNA  Allergies  Allergen Reactions  . Carbamazepine Other (See Comments)    hyponatremia  . Sulfa Antibiotics Other (See Comments)    Unknown reaction  . Tessalon (Benzonatate) Other (See Comments)    dizziness   Labs:  Basename 03/30/12 0127 03/29/12 0346 03/28/12 0354  WBC -- -- 5.7  HGB -- -- 10.2*  PLT -- -- 169  LABCREA -- -- --  CREATININE 0.98 1.00 0.64   Estimated Creatinine Clearance: 57.9 ml/min (by C-G formula based on Cr of 0.98).   Microbiology: 1/17 blood x 2 >> NGTD 1/17 urine >> NGF 1/17 mrsa pcr >> negative 1/17 sputum >> normal flora (pending final) 1/17 influenza >> negative 1/17 strep pneumo/legionella ag >> neg/pending   Assessment:  [name] admit on 1/13 with c/o AMS after choking episode.  Pt left AMA, but returned 1/17 to ED with anxiety/SOB, ED eval suspected aspiration.  Today is Day #4 on Vanc, Zosyn, Levaquin.   Scr increased (0.64 --> 1-->0.98)   VT=25.9 mcg/ml RN gave additional 1Gm @ 0300.  Goal of Therapy:  Vancomycin trough level 15-20 mcg/ml Appropriate abx dosing, eradication of infection.  Plan:   Continue Zosyn 3.375g IV Q8H infused over 4hrs.   Continue Levaquin 750mg  IV q24h  Hold Vancomycin for now.  Check Random Vancomycin level in am 1/22  Follow up renal fxn and culture results.   Lorenza Evangelist  03/30/2012 6:20 AM

## 2012-03-30 NOTE — Care Management Note (Unsigned)
    Page 1 of 2   04/02/2012     4:49:52 PM   CARE MANAGEMENT NOTE 04/02/2012  Patient:  Sydney Hatfield, Sydney Hatfield   Account Number:  [id number]  Date Initiated:  03/26/2012  Documentation initiated by:  DAVIS,RHONDA  Subjective/Objective Assessment:   patient with hx of autstim choked on cheese and now with hypoxia and requiring 02 assistance     Action/Plan:   lives at a group home   Anticipated DC Date:  04/05/2012   Anticipated DC Plan:  GROUP HOME  In-house referral  Clinical Social Worker         Choice offered to / List presented to:  C-6 Parent        HH arranged  HH-2 PT      HH agency  [facility]   Status of service:  In process, will continue to follow Medicare Important Message given?   (If response is "NO", the following Medicare IM given date fields will be blank) Date Medicare IM given:   Date Additional Medicare IM given:    Discharge Disposition:    Per UR Regulation:  Reviewed for med. necessity/level of care/duration of stay  If discussed at Long Length of Stay Meetings, dates discussed:   04/01/2012    Comments:  04/02/12 Rodriguez Aguinaldo RN,BSN NCM [phone] [facility] CHOSEN-KRISTEN(LIASON) AWARE, & FOLLOWING FOR HHPT.  04/01/12 Naryah Clenney RN,BSN NCM [phone] IV ABX.[facility] FOLLOWING FOR HH IF NEEDED.  03/30/12 Tykwon Fera RN,BSN NCM [phone] TRANSFER FROM SDU.SPOKE TO CAREGIVER FROM GROUP HOME:[facility] 6HRS/DAY.[facility]ES [facility] DURING DAY,THEN TO GROUP HOME IN THE EVENING.PT-SNF VS HH.HAS USED [facility] IN PAST INFORMED KRISTEN(LIASON)  FOLLOWING IF D/C PLAN HOME [facility].  [id number] Earlene Plater, RN, BSN, CCM:  CHART REVIEWED AND UPDATED.  Next chart review due on [id number]. NO DISCHARGE NEEDS PRESENT AT THIS TIME. CASE MANAGEMENT [phone]

## 2012-03-31 MED ORDER — DEXTROSE-NACL 5-0.9 % IV SOLN
INTRAVENOUS | Status: DC
  Administered 2012-03-31: 20:00:00 via INTRAVENOUS

## 2012-03-31 MED ORDER — VANCOMYCIN HCL 1000 MG IV SOLR
750.0000 mg | INTRAVENOUS | Status: DC
  Administered 2012-03-31 – 2012-04-02 (×3): 750 mg via INTRAVENOUS
  Filled 2012-03-31 (×3): qty 750

## 2012-03-31 NOTE — Progress Notes (Signed)
ANTIBIOTIC CONSULT NOTE - FOLLOW UP  Pharmacy Consult for Vancomycin, Zosyn, renal adjustment of antibiotics Indication: sepsis from aspiration PNA  Allergies  Allergen Reactions  . Carbamazepine Other (See Comments)    hyponatremia  . Sulfa Antibiotics Other (See Comments)    Unknown reaction  . Tessalon (Benzonatate) Other (See Comments)    dizziness   Labs:  Basename 03/30/12 0127 03/29/12 0346  WBC -- --  HGB -- --  PLT -- --  LABCREA -- --  CREATININE 0.98 1.00   Estimated Creatinine Clearance: 57.9 ml/min (by C-G formula based on Cr of 0.98).   Microbiology: 1/17 blood x 2 >> NGTD 1/17 urine >> NGF 1/17 mrsa pcr >> negative 1/17 sputum >> normal flora (pending final) 1/17 influenza >> negative 1/17 strep pneumo/legionella ag >> neg/pending   Assessment:  [name] admit on 1/13 with c/o AMS after choking episode.  Pt left AMA, but returned 1/17 to ED with anxiety/SOB, ED eval suspected aspiration.  Today is Day #5 on Vanc, Zosyn, Levaquin.   Scr increased (0.64 --> 1-->0.98)   Random vanc= 16.5 mcg/ml this am.  Goal of Therapy:  Vancomycin trough level 15-20 mcg/ml Appropriate abx dosing, eradication of infection.  Plan:   Continue Zosyn 3.375g IV Q8H infused over 4hrs.   Continue Levaquin 750mg  IV q24h  Change to  Vancomycin  750mg  IV q24h.  Follow up renal fxn and culture results.   Sydney Hatfield  03/31/2012 6:59 AM

## 2012-03-31 NOTE — Progress Notes (Signed)
[facility] PROGRESS NOTE  Sydney Hatfield ZOX:[id number] DOB: 17-Oct-1955 DOA: 03/26/2012 PCP: Mickie Hillier, MD  Assessment/Plan: Sepsis with acute respiratory failure likely secondary to aspiration pneumonia  Patient admitted to step down and started on IV vancomycin , zosyn and Levaquin  -repeat CXR PA lateral shows bibasilar opacities. Patient febrile, tachycardic and tachypnic on 1/17. Was hypotensive and tachypnic overnight on day 2 requiring 500 ccx 2 IV NS bolus. ABG was stable.  -afebrile past 48 hrs. . RR and sats normal  -. -d/c clomipramine and give Depakote and klonopin at different intervals given low BP.  -Blood cx negative. Rapid flu negative. Legionella ag pending.  -antitussive for cough  Dysphagia with risk for aspiration  -seen by swallow eval and high risk for aspiration. Made NPO.  -. I have spoken with her neurologist Dr. Herminio Heads at [facility] on 1/17. 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 Her neurologist does not agree that aspiration is caused by effect of the botulinum toxin. Patient has received her third dose of Botulinum toxin this year and her neurologist feels the risk of aspiration with botulinum toxin is not common.  -Continue head of bed elevation.  -Counseled patient on eating slowly and quiet environment Patient failed MBS with high aspiration risk . Mother wants patient to eat Despite high aspiration risk. ( unable to decide on alternate means of feeding) Kept on pureed diet .  History of seizure disorder  Depakote level is therapeutic. Continue current dose of Depakote.  History of mental retardation with agitation.  Continue her home dose of Klonopin. Continue Zoloft  Chronic torticollis  Patient has been taking botulinum toxin toxin injection every 3 months for past 9 months and follows with Dr. Herminio Heads at [facility]. Last injection received 1  months back. Discussed plan with Dr Marya Fossa.  Iron Deficiency anemia  Continue iron supplements   Hypokalemia  replenish  -check mag Tachycardia -Overall improved -Check d-dimer -Remain on telemetry -Continue IV fluids--increase rate as pt is neg fluid balance -EKG in the morning Code Status: Full code  Family Communication: sister at bedside  Disposition Plan:  Plan d/c 04/02/12 to group home with PT    Antibiotics:  IV vnaco , zosyn and levaquin ( 1/17>>) , narrow abx to Levaquin only if afebrile tomorrow       Procedures/Studies: Dg Chest 1 View  03/26/2012  *RADIOLOGY REPORT*  Clinical Data: Fever, respiratory distress.  CHEST - 1 VIEW  Comparison: 03/23/2012  Findings: Left retrocardiac linear scarring or atelectasis.  Low lung volumes with resultant crowding of bronchovascular structures. Suspect mild central pulmonary vascular congestion and some mild bibasilar interstitial edema or infiltrates.  No confluent airspace disease.  No effusion.  Heart size upper limits normal.  Regional bones unremarkable.  IMPRESSION:  Low volumes with suspicion of central pulmonary vascular congestion and mild bibasilar interstitial edema.   Original Report Authenticated By: D. Andria Rhein, MD    Dg Chest 2 View  03/26/2012  *RADIOLOGY REPORT*  Clinical Data: Evaluate for pneumonia.  CHEST - 2 VIEW  Comparison: Earlier today at both 51 hours.  Findings: Degraded lateral view secondary position.  Mildly motion degraded frontal view. Patient rotated right.  Borderline cardiomegaly, accentuated by AP portable technique.  Probable small bilateral pleural effusions on the lateral. No pneumothorax.  The chin overlies the apices.  Development of patchy bibasilar airspace disease.  IMPRESSION: Developing bibasilar airspace disease, which could  represent atelectasis or infection.  Probable small bilateral pleural effusions.  Multifactorial degradation.   Original Report Authenticated By: Jeronimo Greaves, M.D.     Ct Head Wo Contrast  03/23/2012  *RADIOLOGY REPORT*  Clinical Data:  ALTERED MENTAL STATUS,neck pain, torticolis,  h/o pizza choking.  CT HEAD WITHOUT CONTRAST CT CERVICAL SPINE WITHOUT CONTRAST  Technique:  Multidetector CT imaging of the head and cervical spine was performed following the standard protocol without IV contrast. Multiplanar CT image reconstructions of the cervical spine were also generated.  Comparison: 09/13/2010  CT HEAD  Findings: Diffuse parenchymal atrophy. Patchy areas of hypoattenuation in deep and periventricular white matter bilaterally. Negative for acute intracranial hemorrhage, mass lesion, acute infarction, midline shift, or mass-effect. Acute infarct may be inapparent on noncontrast CT. Ventricles and sulci symmetric. Bone windows demonstrate no focal lesion.  IMPRESSION:  1. Negative for bleed or other acute intracranial process.  2. Atrophy and nonspecific white matter changes  CT CERVICAL SPINE  Findings: Normal alignment.  Vertebral body and intervertebral disc height well maintained throughout.  Fusion across the left C3-4 facet.  No prevertebral soft tissue swelling.  Negative for fracture.  Bilateral partially calcified carotid bifurcation plaque.  IMPRESSION: 1.  Negative for fracture or other acute bony abnormality. 2.  Fusion across the left C3-4 facet.   Original Report Authenticated By: D. Andria Rhein, MD    Ct Cervical Spine Wo Contrast  03/23/2012  *RADIOLOGY REPORT*  Clinical Data:  ALTERED MENTAL STATUS,neck pain, torticolis,  h/o pizza choking.  CT HEAD WITHOUT CONTRAST CT CERVICAL SPINE WITHOUT CONTRAST  Technique:  Multidetector CT imaging of the head and cervical spine was performed following the standard protocol without IV contrast. Multiplanar CT image reconstructions of the cervical spine were also generated.  Comparison: 09/13/2010  CT HEAD  Findings: Diffuse parenchymal atrophy. Patchy areas of hypoattenuation in deep and periventricular white matter  bilaterally. Negative for acute intracranial hemorrhage, mass lesion, acute infarction, midline shift, or mass-effect. Acute infarct may be inapparent on noncontrast CT. Ventricles and sulci symmetric. Bone windows demonstrate no focal lesion.  IMPRESSION:  1. Negative for bleed or other acute intracranial process.  2. Atrophy and nonspecific white matter changes  CT CERVICAL SPINE  Findings: Normal alignment.  Vertebral body and intervertebral disc height well maintained throughout.  Fusion across the left C3-4 facet.  No prevertebral soft tissue swelling.  Negative for fracture.  Bilateral partially calcified carotid bifurcation plaque.  IMPRESSION: 1.  Negative for fracture or other acute bony abnormality. 2.  Fusion across the left C3-4 facet.   Original Report Authenticated By: D. Andria Rhein, MD    Dg Chest Portable 1 View  03/22/2012  *RADIOLOGY REPORT*  Clinical Data: Altered mental status.  PORTABLE CHEST - 1 VIEW  Comparison: 03/04/2012  Findings: Coarse bibasilar interstitial markings, probably emphasized by low lung volumes.  No confluent airspace infiltrate. No effusion.  Heart size upper limits normal.  Regional bones unremarkable.  IMPRESSION:  1.  Low volumes.  No definite acute disease.   Original Report Authenticated By: D. Andria Rhein, MD    Dg Swallowing [facility] Pathology  03/29/2012  Chales Abrahams, CCC-SLP     03/29/2012 11:38 AM Objective Swallowing Evaluation: Modified Barium Swallowing Study   Patient Details  Name: Sydney Hatfield MRN: [id number] Date of Birth: [birth year]  Today's Date: 03/29/2012 Time: 1005-1055 SLP Time Calculation (min): 50 min  Past Medical History:  Past Medical History  Diagnosis Date  . Autistic spectrum disorder   .  Anxiety disorder   . Seizure   . MR (mental retardation), moderate   . Torticollis    Past Surgical History:  Past Surgical History  Procedure Date  . Colonoscopy   . Breast biopsy    HPI:  57 yo female h/o moderate mental retardation,  autism, seizure  disorder 2 days ago at her group home choked while eating some  stringy cheese someone had to do the hemlick maneuver on her  which was successful.  She was brought to the ED and evaluated  and was going to be admitted but her mother signed her out AMA  back to the group home.  Pt then admitted next date with sob and  coughing and fever diagnosed with pna.  Pt is now on 4 liters of  oxygen after requiring NR previously.  Pt does becomes tachypneic  with exertion.  H/O spasmodic torticollis present and pt receives  botox injections every 3 months.     Assessment / Plan / Recommendation Clinical Impression  Dysphagia Diagnosis: Moderate oral phase dysphagia;Moderate  pharyngeal phase dysphagia;Moderate cervical esophageal phase  dysphagia  Clinical impression: Moderate oropharyngeal and suspected  cervical esophageal dysphagia present suspected due to  torticollis, MR and deconditioning.    Dysphagia characterized by weakness/decr oral control resulting  in premature spillage of barium into pharynx and decr tongue base  retraction resulting in pharyngeal stasis whic pt does not sense.   Dry swallows help to decrease pharyngeal residuals but do not  clear them and pt did not follow directions to conduct further  dry swallows.  Appearance of decreased UES opening, ? 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   Fortunately pt has large pyriform sinuses that gathers barium.   Please note, pt coughed throughout entire MBS and barium was not  seen in larynx or trachea.  Her aspiration risk remains high due  to her sensoriomotor dysphagia, decreased ability to follow  directions to dry swallow to clear residuals and deconditioning.      Based on report from her mother that the pt will cough on  occasion with intake and h/o MBS completion years prior, suspect  chronic dysphagia that pt typically manages.  Mother states she  would like pt to consume intake with KNOWN risks (recurrent   pulmonary infections, aspiration) and strategies to maximiize  airway protection with MD permission.  Pt is particular about  what she eats but eating gives her QOL per mother.  Educated  mother to importance of monitoring po tolerance, dry swallows to  decr residuals, assuring pt not tachypenic, etc with intake.    Please note:  Pt's head was in chin tuck position as she did not  hold it up even with max cues.  May be beneficial for airway  protection.  Unable to assess swallow in head neutral, which is  position mother states is pt's normal position.   Will inform MD to results and mother's desire and will follow.      Treatment Recommendation  Therapy as outlined in treatment plan below   Diet Recommendation NPO      OR       Puree/thin (currently) if  MD and mother agree with acceptance of aspiration risks    If pt to initiate diet recommend the following strategies  Multiple swallows with EACH bite/sip  Follow solids with liquids Chin tuck position Crush medicine given with applesauce - follow with water Rest breaks if with tachypnea or overtly coughing Sit  upright for at least 30 minutes after meals      Other  Recommendations Oral Care Recommendations: Oral care QID   Follow Up Recommendations  Other (comment) (TBD) TBD   Frequency and Duration min 2x/week  2 weeks   Pertinent Vitals/Pain Low grade temp, lungs decreased- WEAK  nonproductive cough    SLP Swallow Goals Patient will utilize recommended strategies during swallow to  increase swallowing safety with: Total assistance Goal #3: Pt's mother/MD will determine if pt to consume po with  accepted asp risks and strategies in place to minimize aspiration  independently.    General Date of Onset: 03/15/12 HPI: 57 yo female h/o moderate mental retardation, autism,  seizure disorder 2 days ago at her group home choked while eating  some stringy cheese someone had to do the hemlick maneuver on her  which was successful.  She was brought to the ED and evaluated   and was going to be admitted but her mother signed her out AMA  back to the group home.  Pt then admitted next date with sob and  coughing and fever diagnosed with pna.  Pt is now on 4 liters of  oxygen after requiring NR previously.  Pt does becomes tachypneic  with exertion.  H/O spasmodic torticollis present and pt receives  botox injections every 3 months. Type of Study: Modified Barium Swallowing Study Reason for Referral: Objectively evaluate swallowing function Previous Swallow Assessment: Mother reports patient has had a MBS  several years ago, but not in the [facility]. Diet Prior to this Study: NPO Respiratory Status: Supplemental O2 delivered via (comment) (NRB  mask 50%) History of Recent Intubation: No Behavior/Cognition: Alert;Distractible;Requires cueing;Doesn't  follow directions;Decreased sustained attention;Impulsive Oral Cavity - Dentition: Adequate natural dentition Oral Motor / Sensory Function: Impaired - see Bedside swallow  eval Self-Feeding Abilities: Able to feed self;Needs assist Patient Positioning: Upright in chair Baseline Vocal Quality: Low vocal intensity Volitional Cough: Cognitively unable to elicit Volitional Swallow: Unable to elicit Anatomy: Other (Comment) (difficult to view due to motion  degradation)    Reason for Referral Objectively evaluate swallowing function   Oral Phase Oral Preparation/Oral Phase Oral Phase: Impaired Oral - Nectar Oral - Nectar Teaspoon: Piecemeal swallowing;Weak lingual  manipulation;Reduced posterior propulsion Oral - Nectar Cup: Piecemeal swallowing;Weak lingual  manipulation;Reduced posterior propulsion Oral - Thin Oral - Thin Teaspoon: Piecemeal swallowing;Weak lingual  manipulation;Reduced posterior propulsion;Lingual/palatal residue Oral - Thin Cup: Piecemeal swallowing;Weak lingual  manipulation;Reduced posterior propulsion;Lingual/palatal residue Oral - Thin Straw: Piecemeal swallowing;Reduced posterior  propulsion;Lingual/palatal residue Oral  - Solids Oral - Puree: Piecemeal swallowing;Weak lingual  manipulation;Reduced posterior propulsion Oral - Regular: Piecemeal swallowing;Weak lingual  manipulation;Reduced posterior propulsion;Impaired mastication Oral Phase - Comment Oral Phase - Comment: pt essentially allows boluses to spill into  pharynx uncontrolled due to lingual weakness/discoordination   Pharyngeal Phase Pharyngeal Phase Pharyngeal Phase: Impaired Pharyngeal - Nectar Pharyngeal - Nectar Teaspoon: Delayed swallow  initiation;Pharyngeal residue - pyriform sinuses;Lateral channel  residue;Premature spillage to valleculae;Reduced tongue base  retraction Pharyngeal - Nectar Cup: Lateral channel residue;Premature  spillage to valleculae;Reduced tongue base retraction Pharyngeal - Thin Pharyngeal - Thin Teaspoon: Lateral channel residue;Pharyngeal  residue - pyriform sinuses;Pharyngeal residue -  valleculae;Premature spillage to valleculae;Reduced tongue base  retraction Pharyngeal - Thin Cup: Pharyngeal residue - pyriform  sinuses;Lateral channel residue Pharyngeal - Thin Straw: Pharyngeal residue - pyriform  sinuses;Lateral channel residue Pharyngeal - Solids Pharyngeal - Puree: Delayed swallow initiation;Premature spillage  to valleculae;Pharyngeal residue - valleculae;Pharyngeal residue  - posterior  pharnyx;Pharyngeal residue - pyriform sinuses Pharyngeal - Regular: Premature spillage to valleculae;Delayed  swallow initiation;Pharyngeal residue - valleculae Pharyngeal Phase - Comment Pharyngeal Comment: dry swallows decrease residuals but do not  eliminate them, pt's mother states pt was not cooperative  Cervical Esophageal Phase    GO    Cervical Esophageal Phase Cervical Esophageal Phase: Impaired Cervical Esophageal Phase - Nectar Nectar Teaspoon: Reduced cricopharyngeal relaxation Nectar Cup: Reduced cricopharyngeal relaxation Cervical Esophageal Phase - Thin Thin Teaspoon: Reduced cricopharyngeal relaxation Thin Cup: Reduced  cricopharyngeal relaxation Thin Straw: Reduced cricopharyngeal relaxation Cervical Esophageal Phase - Solids Puree: Reduced cricopharyngeal relaxation Regular: Reduced cricopharyngeal relaxation Cervical Esophageal Phase - Comment Cervical Esophageal Comment: 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, appearance of intermittent stasis at distal  esophagus with retrograde propulsion without pt  awarness/sensation         Donavan Burnet, MS [facility] SLP [phone]           Subjective: Patient denies fevers, chills, chest pain. She states her breathing has improved. No nausea, vomiting, diarrhea, abdominal pain. No rashes. No dysuria.  Objective: Filed Vitals:   03/30/12 2201 03/31/12 0501 03/31/12 0506 03/31/12 1400  BP: 121/76 131/115 158/83 138/82  Pulse: 116 113  116  Temp: 97.8 F (36.6 C) 97.9 F (36.6 C)  98.6 F (37 C)  TempSrc: Oral Oral  Oral  Resp: 22 22  18   Height:      Weight:      SpO2: 91% 95%  92%    Intake/Output Summary (Last 24 hours) at 03/31/12 1921 Last data filed at 03/31/12 1900  Gross per 24 hour  Intake    360 ml  Output   3551 ml  Net  -3191 ml   Weight change:  Exam:   General:  Pt is alert, follows commands appropriately, not in acute distress  HEENT: No icterus, No thrush,  /AT  Cardiovascular: RRR, S1/S2, no rubs, no gallops  Respiratory: Bibasilar crackles left greater than right. No wheezes or rhonchi. Good air movement.  Abdomen: Soft/+BS, non tender, non distended, no guarding  Extremities: trace edema, No lymphangitis, No petechiae, No rashes, no synovitis  Data Reviewed: Basic Metabolic Panel:  Lab 03/30/12 4540 03/29/12 0346 03/28/12 0354 03/27/12 0920 03/26/12 0055  NA 140 136 133* 138 138  K 3.7 3.2* 3.0* 3.1* 4.4  CL 108 102 101 107 102  CO2 21 24 22 22 19   GLUCOSE 87 115* 94 90 87  BUN 8 11 7 10  24*  CREATININE 0.98 1.00 0.64 0.73 1.47*  CALCIUM 8.7 8.6 8.4 8.2* 8.9  MG -- -- 1.3* -- --  PHOS -- -- -- -- --    Liver Function Tests:  Lab 03/26/12 0055  AST 29  ALT 9  ALKPHOS 85  BILITOT 0.2*  PROT 6.7  ALBUMIN 2.7*   No results found for this basename: LIPASE:5,AMYLASE:5 in the last 168 hours No results found for this basename: AMMONIA:5 in the last 168 hours CBC:  Lab 03/28/12 0354 03/26/12 0540  WBC 5.7 4.5  NEUTROABS -- 3.5  HGB 10.2* 10.5*  HCT 30.1* 32.2*  MCV 86.2 90.2  PLT 169 174   Cardiac Enzymes: No results found for this basename: CKTOTAL:5,CKMB:5,CKMBINDEX:5,TROPONINI:5 in the last 168 hours BNP: No components found with this basename: POCBNP:5 CBG:  Lab 03/26/12 0857  GLUCAP 72    Recent Results (from the past 240 hour(s))  CULTURE, BLOOD (ROUTINE X 2)     Status: Normal (Preliminary result)   Collection  Time   03/26/12 12:55 AM      Component Value Range Status Comment   Specimen Description BLOOD HAC   Final    Special Requests NONE BOTTLES DRAWN AEROBIC AND ANAEROBIC 5CC   Final    Culture  Setup Time 03/26/2012 11:07   Final    Culture     Final    Value:        BLOOD CULTURE RECEIVED NO GROWTH TO DATE CULTURE WILL BE HELD FOR 5 DAYS BEFORE ISSUING A FINAL NEGATIVE REPORT   Report Status PENDING   Incomplete   URINE CULTURE     Status: Normal   Collection Time   03/26/12  1:26 AM      Component Value Range Status Comment   Specimen Description URINE, CATHETERIZED   Final    Special Requests NONE   Final    Culture  Setup Time 03/26/2012 14:22   Final    Colony Count NO GROWTH   Final    Culture NO GROWTH   Final    Report Status 03/27/2012 FINAL   Final   MRSA PCR SCREENING     Status: Normal   Collection Time   03/26/12  4:08 AM      Component Value Range Status Comment   MRSA by PCR NEGATIVE  NEGATIVE Final   CULTURE, BLOOD (ROUTINE X 2)     Status: Normal (Preliminary result)   Collection Time   03/26/12  5:30 AM      Component Value Range Status Comment   Specimen Description BLOOD LEFT HAND   Final    Special Requests BOTTLES DRAWN AEROBIC  ONLY 1CC   Final    Culture  Setup Time 03/26/2012 11:07   Final    Culture     Final    Value:        BLOOD CULTURE RECEIVED NO GROWTH TO DATE CULTURE WILL BE HELD FOR 5 DAYS BEFORE ISSUING A FINAL NEGATIVE REPORT   Report Status PENDING   Incomplete   CULTURE, EXPECTORATED SPUTUM-ASSESSMENT     Status: Normal   Collection Time   03/26/12  7:51 AM      Component Value Range Status Comment   Specimen Description SPUTUM   Final    Special Requests Normal   Final    Sputum evaluation     Final    Value: THIS SPECIMEN IS ACCEPTABLE. RESPIRATORY CULTURE REPORT TO FOLLOW.   Report Status 03/26/2012 FINAL   Final   CULTURE, RESPIRATORY     Status: Normal   Collection Time   03/26/12  7:51 AM      Component Value Range Status Comment   Specimen Description SPUTUM   Final    Special Requests NONE   Final    Gram Stain     Final    Value: ABUNDANT WBC PRESENT,BOTH PMN AND MONONUCLEAR     FEW SQUAMOUS EPITHELIAL CELLS PRESENT     RARE GRAM POSITIVE COCCI     IN PAIRS RARE GRAM POSITIVE RODS     RARE GRAM NEGATIVE RODS   Culture NORMAL OROPHARYNGEAL FLORA   Final    Report Status 03/28/2012 FINAL   Final      Scheduled Meds:   . antiseptic oral rinse  15 mL Mouth Rinse BID  . aspirin EC  81 mg Oral Daily  . clonazePAM  0.5 mg Oral TID  . clonazePAM  1 mg Oral Daily  . enoxaparin (LOVENOX) injection  40  mg Subcutaneous Q24H  . levofloxacin (LEVAQUIN) IV  750 mg Intravenous Q24H  . pantoprazole  40 mg Oral Daily  . piperacillin-tazobactam (ZOSYN)  IV  3.375 g Intravenous Q8H  . sertraline  200 mg Oral Daily  . valproate sodium  500 mg Intravenous Q12H  . vancomycin  750 mg Intravenous Q24H   Continuous Infusions:   . dextrose 5 % and 0.9% NaCl 100 mL/hr at 03/30/12 0254     Jmarion Christiano, DO  [facility] Pager [phone]  If 7PM-7AM, please contact night-coverage [email or web address] Password [facility] 03/31/2012, 7:21 PM   LOS: 5 days

## 2012-04-01 ENCOUNTER — Inpatient Hospital Stay (HOSPITAL_COMMUNITY): Payer: Medicare Other

## 2012-04-01 ENCOUNTER — Encounter (HOSPITAL_COMMUNITY): Payer: Self-pay | Admitting: Radiology

## 2012-04-01 DIAGNOSIS — R791 Abnormal coagulation profile: Secondary | ICD-10-CM

## 2012-04-01 DIAGNOSIS — R7989 Other specified abnormal findings of blood chemistry: Secondary | ICD-10-CM

## 2012-04-01 LAB — CBC WITH DIFFERENTIAL/PLATELET
Basophils Relative: 0 % (ref 0–1)
Eosinophils Relative: 1 % (ref 0–5)
Hemoglobin: 9.7 g/dL — ABNORMAL LOW (ref 12.0–15.0)
Lymphocytes Relative: 14 % (ref 12–46)
Monocytes Absolute: 2.5 10*3/uL — ABNORMAL HIGH (ref 0.1–1.0)
Monocytes Relative: 13 % — ABNORMAL HIGH (ref 3–12)
Neutrophils Relative %: 72 % (ref 43–77)
Platelets: 187 10*3/uL (ref 150–400)
RBC: 3.31 MIL/uL — ABNORMAL LOW (ref 3.87–5.11)
WBC: 19.5 10*3/uL — ABNORMAL HIGH (ref 4.0–10.5)

## 2012-04-01 LAB — URINALYSIS, MICROSCOPIC ONLY
Bilirubin Urine: NEGATIVE
Leukocytes, UA: NEGATIVE
Nitrite: NEGATIVE
Specific Gravity, Urine: 1.028 (ref 1.005–1.030)
Urobilinogen, UA: 0.2 mg/dL (ref 0.0–1.0)

## 2012-04-01 LAB — CULTURE, BLOOD (ROUTINE X 2)
Culture: NO GROWTH
Culture: NO GROWTH

## 2012-04-01 LAB — BASIC METABOLIC PANEL
Calcium: 8.2 mg/dL — ABNORMAL LOW (ref 8.4–10.5)
GFR calc Af Amer: 75 mL/min — ABNORMAL LOW (ref >90)
GFR calc non Af Amer: 65 mL/min — ABNORMAL LOW (ref >90)
Potassium: 2.9 mEq/L — ABNORMAL LOW (ref 3.5–5.1)
Sodium: 143 mEq/L (ref 135–145)

## 2012-04-01 LAB — MAGNESIUM: Magnesium: 1.3 mg/dL — ABNORMAL LOW (ref 1.5–2.5)

## 2012-04-01 LAB — D-DIMER, QUANTITATIVE: D-Dimer, Quant: 0.56 ug/mL-FEU — ABNORMAL HIGH (ref 0.00–0.48)

## 2012-04-01 MED ORDER — POTASSIUM CHLORIDE 2 MEQ/ML IV SOLN
INTRAVENOUS | Status: DC
  Administered 2012-04-01 – 2012-04-04 (×6): via INTRAVENOUS
  Filled 2012-04-01 (×13): qty 1000

## 2012-04-01 MED ORDER — MAGNESIUM SULFATE 40 MG/ML IJ SOLN
2.0000 g | Freq: Once | INTRAMUSCULAR | Status: AC
  Administered 2012-04-01: 2 g via INTRAVENOUS
  Filled 2012-04-01: qty 50

## 2012-04-01 MED ORDER — RANITIDINE HCL 150 MG/10ML PO SYRP
150.0000 mg | ORAL_SOLUTION | Freq: Every day | ORAL | Status: DC
  Administered 2012-04-01 – 2012-04-03 (×3): 150 mg via ORAL
  Filled 2012-04-01 (×4): qty 10

## 2012-04-01 MED ORDER — IOHEXOL 350 MG/ML SOLN
100.0000 mL | Freq: Once | INTRAVENOUS | Status: AC | PRN
  Administered 2012-04-01: 100 mL via INTRAVENOUS

## 2012-04-01 MED ORDER — POTASSIUM CHLORIDE CRYS ER 20 MEQ PO TBCR
40.0000 meq | EXTENDED_RELEASE_TABLET | Freq: Once | ORAL | Status: AC
  Administered 2012-04-01: 40 meq via ORAL
  Filled 2012-04-01: qty 2

## 2012-04-01 MED ORDER — CLOMIPRAMINE HCL 25 MG PO CAPS
100.0000 mg | ORAL_CAPSULE | Freq: Every day | ORAL | Status: DC
  Administered 2012-04-01 – 2012-04-03 (×3): 100 mg via ORAL
  Filled 2012-04-01 (×4): qty 4

## 2012-04-01 NOTE — Progress Notes (Signed)
Physical Therapy Treatment Patient Details Name: Sydney Hatfield MRN: [id number] DOB: 1955-06-13 Today's Date: 04/01/2012 Time: 4098-1191 PT Time Calculation (min): 10 min  PT Assessment / Plan / Recommendation Comments on Treatment Session  Pt ambulated in hallway with 2 HHA (pt refused RW) from therapist and pt's mother who also provided encouragement; pt then assisted back to bed.    Follow Up Recommendations  Home health PT;Supervision/Assistance - 24 hour     Does the patient have the potential to tolerate intense rehabilitation     Barriers to Discharge        Equipment Recommendations  Rolling walker with 5" wheels    Recommendations for Other Services    Frequency     Plan      Precautions / Restrictions Precautions Precautions: Fall Precaution Comments: hx MR, CTA negative for PE   Pertinent Vitals/Pain n/a    Mobility  Bed Mobility Bed Mobility: Sit to Supine Sit to Supine: 5: Supervision;HOB elevated Transfers Transfers: Stand to Sit;Sit to Stand Sit to Stand: 4: Min guard Stand to Sit: 4: Min guard Details for Transfer Assistance: verbal cues for safety, need for upper body assist Ambulation/Gait Ambulation/Gait Assistance: 4: Min assist Ambulation Distance (Feet): 240 Feet Assistive device: 2 person hand held assist Ambulation/Gait Assistance Details: pt refused to use RW so 2 HHA provided by therapist and pt's mother, assist to steady, pt reports fear of falling Gait Pattern: Step-through pattern;Trunk flexed;Decreased stride length    Exercises     PT Diagnosis:    PT Problem List:   PT Treatment Interventions:     PT Goals Acute Rehab PT Goals PT Goal: Sit to Supine/Side - Progress: Met PT Goal: Sit to Stand - Progress: Progressing toward goal PT Goal: Stand to Sit - Progress: Progressing toward goal Pt will Ambulate: >150 feet;with supervision;with least restrictive assistive device PT Goal: Ambulate - Progress: Updated due to goal  met  Visit Information  Last PT Received On: 04/01/12 Assistance Needed: +2    Subjective Data  Subjective: That's cindy doll.   Cognition  Overall Cognitive Status: History of cognitive impairments - at baseline Arousal/Alertness: Awake/alert    Balance     End of Session PT - End of Session Activity Tolerance: Patient tolerated treatment well Patient left: in bed;with call bell/phone within reach;with family/visitor present   GP     Kunaal Walkins,KATHrine E 04/01/2012, 2:34 PM  Zenovia Jarred, PT, DPT 04/01/2012 Pager: [phone]

## 2012-04-01 NOTE — Progress Notes (Signed)
Speech Language Pathology Dysphagia Treatment Patient Details Name: Sydney Hatfield MRN: [id number] DOB: 08/23/1955 Today's Date: 04/01/2012 Time: 9147-8295 SLP Time Calculation (min): 20 min  Assessment / Plan / Recommendation Clinical Impression  Pt seen for education of caregiver Clydie Braun to strategies to minimize amount she aspirates and findings of MBS evaluation.  Clydie Braun denies pt having dysphagia PTA and states pt was not with her when choking event happened.  Clydie Braun has been pt's caregiver for 7 years.  SLP reviewed purpose of compensatory strategies, foods best tolerated if aspirated and importance of monitoring pt's tolerance closely.  Pt coughing with and without po and during mbs coughed without barium visualized in larynx/trachea.  Chronic dysphagia due to neuromuscular condition present that will fluctuate.  Pt's dysphagia is not behavioral - although cognitive deficits decrease ability to follow strategies.  Informed Clydie Braun to adjust diet (eg only liquids, etc) and amount of intake based on pt's swallow ability to accommodate fluctuation in function.  Clydie Braun reported understanding to all information provided and denied questions.  RN reported pt coughing with minimal amount of intake she provided for medication today, informed her of findings of mbs and need to monitor vitals for tolerance.  SLP rec to continue pureed/thin diet with strict precautions.  All education completed.  Please reorder if indicated.      Diet Recommendation  Initiate / Change Diet: Dysphagia 1 (puree);Thin liquid (with risks)    SLP Plan All goals met   Pertinent Vitals/Pain Afebrile, room air, intake 75%, diet advanced to pureed today   Swallowing Goals  SLP Swallowing Goals Swallow Study Goal #2 - Progress: Met Swallow Study Goal #3 - Progress: Met  General Temperature Spikes Noted: No Respiratory Status: Room air Behavior/Cognition: Alert Oral Cavity - Dentition: Adequate natural dentition Patient  Positioning: Partially reclined    Dysphagia Treatment Treatment focused on: Patient/family/caregiver education Family/Caregiver Educated: caregiver Clydie Braun (caregiver x7 years) Patient observed directly with PO's: No Reason PO's not observed: Other (comment) (poor positioning in bed, education visit only)   GO     Donavan Burnet, MS [facility] SLP [phone]

## 2012-04-01 NOTE — Progress Notes (Signed)
[facility] PROGRESS NOTE  Sydney Hatfield ZOX:[id number] DOB: May 07, 1955 DOA: 03/26/2012 PCP: Mickie Hillier, MD  Assessment/Plan: Sepsis with acute respiratory failure likely secondary to aspiration pneumonia  -Patient admitted to step down and started on IV vancomycin , zosyn and Levaquin D#7 -repeat CXR PA lateral shows bibasilar opacities. Patient febrile, tachycardic and tachypnic on 1/17. Was hypotensive and tachypnic overnight on day 2 requiring 500 ccx 2 IV NS bolus on 1/17. ABG was stable.  -afebrile past 72 hrs.  RR and sats normal  -1/17/14Blood cx negative. Rapid flu negative. Legionella ag pending.  -antitussive for cough  Leukocytosis -WBC increased to 19,500--04/01/12 -UA with urine culture -Blood cultures x2 sets Elevated d-dimer -Patient complains of intermittent shortness of breath -CT angiogram chest rule out pulmonary embolus Dysphagia with risk for aspiration  -seen by swallow eval and high risk for aspiration. Made NPO.  -spoken with her neurologist Dr. Herminio Heads at [facility] on 1/17. 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 Her neurologist does not agree that aspiration is caused by effect of the botulinum toxin. Patient has received her third dose of Botulinum toxin this year and her neurologist feels the risk of aspiration with botulinum toxin is not common.  -Continue head of bed elevation.  -Counseled patient on eating slowly and quiet environment  -Patient failed MBS with high aspiration risk 03/29/2012 . Mother wants patient to eat Despite high aspiration risk. ( unable to decide on alternate means of feeding) Kept on pureed diet . -Although risk of aspiration remains, mother does not wish for gastrostomy tube at this time  History of seizure disorder  Depakote level is therapeutic. Continue current dose of Depakote.  History of mental retardation with agitation.    Continue her home dose of Klonopin. Continue Zoloft  -Restart clomipramine Chronic torticollis  Patient has been taking botulinum toxin toxin injection every 3 months for past 9 months and follows with Dr. Herminio Heads at [facility]. Last injection received 1 months back. Discussed plan with Dr Marya Fossa.  Iron Deficiency anemia  Continue iron supplements   Hypokalemia/hypomagnesemia  replenish  -Recheck labs in the am Tachycardia  -Suspect volume depletion as patient remains -3.5 L for the admission -Overall improving but remains tachycardic -Remain on telemetry  -Continue IV fluids--increase rate as pt is neg fluid balance  -EKG in the morning  Code Status: Full code  Family Communication: Mother updated at bedside  Disposition Plan: Plan d/c to the group home when clinically stable         Procedures/Studies: Dg Chest 1 View  03/26/2012  *RADIOLOGY REPORT*  Clinical Data: Fever, respiratory distress.  CHEST - 1 VIEW  Comparison: 03/23/2012  Findings: Left retrocardiac linear scarring or atelectasis.  Low lung volumes with resultant crowding of bronchovascular structures. Suspect mild central pulmonary vascular congestion and some mild bibasilar interstitial edema or infiltrates.  No confluent airspace disease.  No effusion.  Heart size upper limits normal.  Regional bones unremarkable.  IMPRESSION:  Low volumes with suspicion of central pulmonary vascular congestion and mild bibasilar interstitial edema.   Original Report Authenticated By: D. Andria Rhein, MD    Dg Chest 2 View  03/26/2012  *RADIOLOGY REPORT*  Clinical Data: Evaluate for pneumonia.  CHEST - 2 VIEW  Comparison: Earlier today at both 51 hours.  Findings: Degraded lateral view secondary position.  Mildly motion degraded frontal view. Patient rotated right.  Borderline cardiomegaly, accentuated by AP portable  technique.  Probable small bilateral pleural effusions on the lateral. No pneumothorax.  The chin  overlies the apices.  Development of patchy bibasilar airspace disease.  IMPRESSION: Developing bibasilar airspace disease, which could represent atelectasis or infection.  Probable small bilateral pleural effusions.  Multifactorial degradation.   Original Report Authenticated By: Jeronimo Greaves, M.D.    Ct Head Wo Contrast  03/23/2012  *RADIOLOGY REPORT*  Clinical Data:  ALTERED MENTAL STATUS,neck pain, torticolis,  h/o pizza choking.  CT HEAD WITHOUT CONTRAST CT CERVICAL SPINE WITHOUT CONTRAST  Technique:  Multidetector CT imaging of the head and cervical spine was performed following the standard protocol without IV contrast. Multiplanar CT image reconstructions of the cervical spine were also generated.  Comparison: 09/13/2010  CT HEAD  Findings: Diffuse parenchymal atrophy. Patchy areas of hypoattenuation in deep and periventricular white matter bilaterally. Negative for acute intracranial hemorrhage, mass lesion, acute infarction, midline shift, or mass-effect. Acute infarct may be inapparent on noncontrast CT. Ventricles and sulci symmetric. Bone windows demonstrate no focal lesion.  IMPRESSION:  1. Negative for bleed or other acute intracranial process.  2. Atrophy and nonspecific white matter changes  CT CERVICAL SPINE  Findings: Normal alignment.  Vertebral body and intervertebral disc height well maintained throughout.  Fusion across the left C3-4 facet.  No prevertebral soft tissue swelling.  Negative for fracture.  Bilateral partially calcified carotid bifurcation plaque.  IMPRESSION: 1.  Negative for fracture or other acute bony abnormality. 2.  Fusion across the left C3-4 facet.   Original Report Authenticated By: D. Andria Rhein, MD    Ct Cervical Spine Wo Contrast  03/23/2012  *RADIOLOGY REPORT*  Clinical Data:  ALTERED MENTAL STATUS,neck pain, torticolis,  h/o pizza choking.  CT HEAD WITHOUT CONTRAST CT CERVICAL SPINE WITHOUT CONTRAST  Technique:  Multidetector CT imaging of the head and cervical  spine was performed following the standard protocol without IV contrast. Multiplanar CT image reconstructions of the cervical spine were also generated.  Comparison: 09/13/2010  CT HEAD  Findings: Diffuse parenchymal atrophy. Patchy areas of hypoattenuation in deep and periventricular white matter bilaterally. Negative for acute intracranial hemorrhage, mass lesion, acute infarction, midline shift, or mass-effect. Acute infarct may be inapparent on noncontrast CT. Ventricles and sulci symmetric. Bone windows demonstrate no focal lesion.  IMPRESSION:  1. Negative for bleed or other acute intracranial process.  2. Atrophy and nonspecific white matter changes  CT CERVICAL SPINE  Findings: Normal alignment.  Vertebral body and intervertebral disc height well maintained throughout.  Fusion across the left C3-4 facet.  No prevertebral soft tissue swelling.  Negative for fracture.  Bilateral partially calcified carotid bifurcation plaque.  IMPRESSION: 1.  Negative for fracture or other acute bony abnormality. 2.  Fusion across the left C3-4 facet.   Original Report Authenticated By: D. Andria Rhein, MD    Dg Chest Portable 1 View  03/22/2012  *RADIOLOGY REPORT*  Clinical Data: Altered mental status.  PORTABLE CHEST - 1 VIEW  Comparison: 03/04/2012  Findings: Coarse bibasilar interstitial markings, probably emphasized by low lung volumes.  No confluent airspace infiltrate. No effusion.  Heart size upper limits normal.  Regional bones unremarkable.  IMPRESSION:  1.  Low volumes.  No definite acute disease.   Original Report Authenticated By: D. Andria Rhein, MD    Dg Swallowing [facility] Pathology  03/29/2012  Chales Abrahams, CCC-SLP     03/29/2012 11:38 AM Objective Swallowing Evaluation: Modified Barium Swallowing Study   Patient Details  Name: Sydney Hatfield MRN: [id number]  Date of Birth: 1955-04-01  Today's Date: 03/29/2012 Time: 1005-1055 SLP Time Calculation (min): 50 min  Past Medical History:  Past Medical  History  Diagnosis Date  . Autistic spectrum disorder   . Anxiety disorder   . Seizure   . MR (mental retardation), moderate   . Torticollis    Past Surgical History:  Past Surgical History  Procedure Date  . Colonoscopy   . Breast biopsy    HPI:  57 yo female h/o moderate mental retardation, autism, seizure  disorder 2 days ago at her group home choked while eating some  stringy cheese someone had to do the hemlick maneuver on her  which was successful.  She was brought to the ED and evaluated  and was going to be admitted but her mother signed her out AMA  back to the group home.  Pt then admitted next date with sob and  coughing and fever diagnosed with pna.  Pt is now on 4 liters of  oxygen after requiring NR previously.  Pt does becomes tachypneic  with exertion.  H/O spasmodic torticollis present and pt receives  botox injections every 3 months.     Assessment / Plan / Recommendation Clinical Impression  Dysphagia Diagnosis: Moderate oral phase dysphagia;Moderate  pharyngeal phase dysphagia;Moderate cervical esophageal phase  dysphagia  Clinical impression: Moderate oropharyngeal and suspected  cervical esophageal dysphagia present suspected due to  torticollis, MR and deconditioning.    Dysphagia characterized by weakness/decr oral control resulting  in premature spillage of barium into pharynx and decr tongue base  retraction resulting in pharyngeal stasis whic pt does not sense.   Dry swallows help to decrease pharyngeal residuals but do not  clear them and pt did not follow directions to conduct further  dry swallows.  Appearance of decreased UES opening, ? 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   Fortunately pt has large pyriform sinuses that gathers barium.   Please note, pt coughed throughout entire MBS and barium was not  seen in larynx or trachea.  Her aspiration risk remains high due  to her sensoriomotor dysphagia, decreased ability to follow  directions to dry swallow  to clear residuals and deconditioning.      Based on report from her mother that the pt will cough on  occasion with intake and h/o MBS completion years prior, suspect  chronic dysphagia that pt typically manages.  Mother states she  would like pt to consume intake with KNOWN risks (recurrent  pulmonary infections, aspiration) and strategies to maximiize  airway protection with MD permission.  Pt is particular about  what she eats but eating gives her QOL per mother.  Educated  mother to importance of monitoring po tolerance, dry swallows to  decr residuals, assuring pt not tachypenic, etc with intake.    Please note:  Pt's head was in chin tuck position as she did not  hold it up even with max cues.  May be beneficial for airway  protection.  Unable to assess swallow in head neutral, which is  position mother states is pt's normal position.   Will inform MD to results and mother's desire and will follow.      Treatment Recommendation  Therapy as outlined in treatment plan below   Diet Recommendation NPO      OR       Puree/thin (currently) if  MD and mother agree with acceptance of aspiration risks    If pt to initiate diet recommend  the following strategies  Multiple swallows with EACH bite/sip  Follow solids with liquids Chin tuck position Crush medicine given with applesauce - follow with water Rest breaks if with tachypnea or overtly coughing Sit upright for at least 30 minutes after meals      Other  Recommendations Oral Care Recommendations: Oral care QID   Follow Up Recommendations  Other (comment) (TBD) TBD   Frequency and Duration min 2x/week  2 weeks   Pertinent Vitals/Pain Low grade temp, lungs decreased- WEAK  nonproductive cough    SLP Swallow Goals Patient will utilize recommended strategies during swallow to  increase swallowing safety with: Total assistance Goal #3: Pt's mother/MD will determine if pt to consume po with  accepted asp risks and strategies in place to minimize aspiration   independently.    General Date of Onset: 03/15/12 HPI: 57 yo female h/o moderate mental retardation, autism,  seizure disorder 2 days ago at her group home choked while eating  some stringy cheese someone had to do the hemlick maneuver on her  which was successful.  She was brought to the ED and evaluated  and was going to be admitted but her mother signed her out AMA  back to the group home.  Pt then admitted next date with sob and  coughing and fever diagnosed with pna.  Pt is now on 4 liters of  oxygen after requiring NR previously.  Pt does becomes tachypneic  with exertion.  H/O spasmodic torticollis present and pt receives  botox injections every 3 months. Type of Study: Modified Barium Swallowing Study Reason for Referral: Objectively evaluate swallowing function Previous Swallow Assessment: Mother reports patient has had a MBS  several years ago, but not in the [facility]. Diet Prior to this Study: NPO Respiratory Status: Supplemental O2 delivered via (comment) (NRB  mask 50%) History of Recent Intubation: No Behavior/Cognition: Alert;Distractible;Requires cueing;Doesn't  follow directions;Decreased sustained attention;Impulsive Oral Cavity - Dentition: Adequate natural dentition Oral Motor / Sensory Function: Impaired - see Bedside swallow  eval Self-Feeding Abilities: Able to feed self;Needs assist Patient Positioning: Upright in chair Baseline Vocal Quality: Low vocal intensity Volitional Cough: Cognitively unable to elicit Volitional Swallow: Unable to elicit Anatomy: Other (Comment) (difficult to view due to motion  degradation)    Reason for Referral Objectively evaluate swallowing function   Oral Phase Oral Preparation/Oral Phase Oral Phase: Impaired Oral - Nectar Oral - Nectar Teaspoon: Piecemeal swallowing;Weak lingual  manipulation;Reduced posterior propulsion Oral - Nectar Cup: Piecemeal swallowing;Weak lingual  manipulation;Reduced posterior propulsion Oral - Thin Oral - Thin Teaspoon: Piecemeal  swallowing;Weak lingual  manipulation;Reduced posterior propulsion;Lingual/palatal residue Oral - Thin Cup: Piecemeal swallowing;Weak lingual  manipulation;Reduced posterior propulsion;Lingual/palatal residue Oral - Thin Straw: Piecemeal swallowing;Reduced posterior  propulsion;Lingual/palatal residue Oral - Solids Oral - Puree: Piecemeal swallowing;Weak lingual  manipulation;Reduced posterior propulsion Oral - Regular: Piecemeal swallowing;Weak lingual  manipulation;Reduced posterior propulsion;Impaired mastication Oral Phase - Comment Oral Phase - Comment: pt essentially allows boluses to spill into  pharynx uncontrolled due to lingual weakness/discoordination   Pharyngeal Phase Pharyngeal Phase Pharyngeal Phase: Impaired Pharyngeal - Nectar Pharyngeal - Nectar Teaspoon: Delayed swallow  initiation;Pharyngeal residue - pyriform sinuses;Lateral channel  residue;Premature spillage to valleculae;Reduced tongue base  retraction Pharyngeal - Nectar Cup: Lateral channel residue;Premature  spillage to valleculae;Reduced tongue base retraction Pharyngeal - Thin Pharyngeal - Thin Teaspoon: Lateral channel residue;Pharyngeal  residue - pyriform sinuses;Pharyngeal residue -  valleculae;Premature spillage to valleculae;Reduced tongue base  retraction Pharyngeal - Thin Cup: Pharyngeal residue - pyriform  sinuses;Lateral channel residue Pharyngeal - Thin Straw: Pharyngeal residue - pyriform  sinuses;Lateral channel residue Pharyngeal - Solids Pharyngeal - Puree: Delayed swallow initiation;Premature spillage  to valleculae;Pharyngeal residue - valleculae;Pharyngeal residue  - posterior pharnyx;Pharyngeal residue - pyriform sinuses Pharyngeal - Regular: Premature spillage to valleculae;Delayed  swallow initiation;Pharyngeal residue - valleculae Pharyngeal Phase - Comment Pharyngeal Comment: dry swallows decrease residuals but do not  eliminate them, pt's mother states pt was not cooperative  Cervical Esophageal Phase    GO     Cervical Esophageal Phase Cervical Esophageal Phase: Impaired Cervical Esophageal Phase - Nectar Nectar Teaspoon: Reduced cricopharyngeal relaxation Nectar Cup: Reduced cricopharyngeal relaxation Cervical Esophageal Phase - Thin Thin Teaspoon: Reduced cricopharyngeal relaxation Thin Cup: Reduced cricopharyngeal relaxation Thin Straw: Reduced cricopharyngeal relaxation Cervical Esophageal Phase - Solids Puree: Reduced cricopharyngeal relaxation Regular: Reduced cricopharyngeal relaxation Cervical Esophageal Phase - Comment Cervical Esophageal Comment: 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, appearance of intermittent stasis at distal  esophagus with retrograde propulsion without pt  awarness/sensation         Donavan Burnet, MS [facility] SLP [phone]           Subjective: Patient complains of intermittent shortness of breath. Denies fevers, chills, chest pain, nausea, vomiting, abdominal pain, diarrhea, dizziness, headache.  Objective: Filed Vitals:   03/31/12 0506 03/31/12 1400 03/31/12 2110 04/01/12 0634  BP: 158/83 138/82 141/86 139/79  Pulse:  116 115 108  Temp:  98.6 F (37 C) 98.2 F (36.8 C) 98.2 F (36.8 C)  TempSrc:  Oral Oral Oral  Resp:  18 18 17   Height:      Weight:      SpO2:  92% 94% 96%    Intake/Output Summary (Last 24 hours) at 04/01/12 1030 Last data filed at 04/01/12 1478  Gross per 24 hour  Intake   2005 ml  Output   5701 ml  Net  -3696 ml   Weight change:  Exam:   General:  Pt is alert, follows commands appropriately, not in acute distress  HEENT: No icterus, No thrush,  [place]/AT  Cardiovascular: RRR, S1/S2, no rubs, no gallops  Respiratory: poor inspiratory effort. Basilar crackles, left greater than right. No wheezes or rhonchi.  Abdomen: Soft/+BS, non tender, non distended, no guarding  Extremities: Trace edema left greater than right leg, No lymphangitis, No petechiae, No rashes, no synovitis  Data Reviewed: Basic Metabolic Panel:  Lab  04/01/12 0544 03/30/12 0127 03/29/12 0346 03/28/12 0354 03/27/12 0920  NA 143 140 136 133* 138  K 2.9* 3.7 3.2* 3.0* 3.1*  CL 106 108 102 101 107  CO2 27 21 24 22 22   GLUCOSE 101* 87 115* 94 90  BUN 5* 8 11 7 10   CREATININE 0.96 0.98 1.00 0.64 0.73  CALCIUM 8.2* 8.7 8.6 8.4 8.2*  MG 1.3* -- -- 1.3* --  PHOS -- -- -- -- --   Liver Function Tests:  Lab 03/26/12 0055  AST 29  ALT 9  ALKPHOS 85  BILITOT 0.2*  PROT 6.7  ALBUMIN 2.7*   No results found for this basename: LIPASE:5,AMYLASE:5 in the last 168 hours No results found for this basename: AMMONIA:5 in the last 168 hours CBC:  Lab 04/01/12 0544 03/28/12 0354 03/26/12 0540  WBC 19.5* 5.7 4.5  NEUTROABS 14.1* -- 3.5  HGB 9.7* 10.2* 10.5*  HCT 28.7* 30.1* 32.2*  MCV 86.7 86.2 90.2  PLT 187 169 174   Cardiac Enzymes: No results found for this basename: CKTOTAL:5,CKMB:5,CKMBINDEX:5,TROPONINI:5 in the last 168  hours BNP: No components found with this basename: POCBNP:5 CBG:  Lab 03/26/12 0857  GLUCAP 72    Recent Results (from the past 240 hour(s))  CULTURE, BLOOD (ROUTINE X 2)     Status: Normal   Collection Time   03/26/12 12:55 AM      Component Value Range Status Comment   Specimen Description BLOOD HAC   Final    Special Requests NONE BOTTLES DRAWN AEROBIC AND ANAEROBIC 5CC   Final    Culture  Setup Time 03/26/2012 11:07   Final    Culture NO GROWTH 5 DAYS   Final    Report Status 04/01/2012 FINAL   Final   URINE CULTURE     Status: Normal   Collection Time   03/26/12  1:26 AM      Component Value Range Status Comment   Specimen Description URINE, CATHETERIZED   Final    Special Requests NONE   Final    Culture  Setup Time 03/26/2012 14:22   Final    Colony Count NO GROWTH   Final    Culture NO GROWTH   Final    Report Status 03/27/2012 FINAL   Final   MRSA PCR SCREENING     Status: Normal   Collection Time   03/26/12  4:08 AM      Component Value Range Status Comment   MRSA by PCR NEGATIVE  NEGATIVE  Final   CULTURE, BLOOD (ROUTINE X 2)     Status: Normal   Collection Time   03/26/12  5:30 AM      Component Value Range Status Comment   Specimen Description BLOOD LEFT HAND   Final    Special Requests BOTTLES DRAWN AEROBIC ONLY 1CC   Final    Culture  Setup Time 03/26/2012 11:07   Final    Culture NO GROWTH 5 DAYS   Final    Report Status 04/01/2012 FINAL   Final   CULTURE, EXPECTORATED SPUTUM-ASSESSMENT     Status: Normal   Collection Time   03/26/12  7:51 AM      Component Value Range Status Comment   Specimen Description SPUTUM   Final    Special Requests Normal   Final    Sputum evaluation     Final    Value: THIS SPECIMEN IS ACCEPTABLE. RESPIRATORY CULTURE REPORT TO FOLLOW.   Report Status 03/26/2012 FINAL   Final   CULTURE, RESPIRATORY     Status: Normal   Collection Time   03/26/12  7:51 AM      Component Value Range Status Comment   Specimen Description SPUTUM   Final    Special Requests NONE   Final    Gram Stain     Final    Value: ABUNDANT WBC PRESENT,BOTH PMN AND MONONUCLEAR     FEW SQUAMOUS EPITHELIAL CELLS PRESENT     RARE GRAM POSITIVE COCCI     IN PAIRS RARE GRAM POSITIVE RODS     RARE GRAM NEGATIVE RODS   Culture NORMAL OROPHARYNGEAL FLORA   Final    Report Status 03/28/2012 FINAL   Final      Scheduled Meds:   . antiseptic oral rinse  15 mL Mouth Rinse BID  . aspirin EC  81 mg Oral Daily  . clomiPRAMINE  100 mg Oral QHS  . clonazePAM  0.5 mg Oral TID  . clonazePAM  1 mg Oral Daily  . enoxaparin (LOVENOX) injection  40 mg Subcutaneous Q24H  . levofloxacin (  LEVAQUIN) IV  750 mg Intravenous Q24H  . magnesium sulfate 1 - 4 g bolus IVPB  2 g Intravenous Once  . pantoprazole  40 mg Oral Daily  . piperacillin-tazobactam (ZOSYN)  IV  3.375 g Intravenous Q8H  . potassium chloride  40 mEq Oral Once  . ranitidine  150 mg Oral QHS  . sertraline  200 mg Oral Daily  . valproate sodium  500 mg Intravenous Q12H  . vancomycin  750 mg Intravenous Q24H   Continuous  Infusions:   . dextrose 5 % and 0.9% NaCl 1,000 mL with potassium chloride 20 mEq infusion       Tashonda Pinkus, DO  [facility] Pager [phone]  If 7PM-7AM, please contact night-coverage [email or web address] Password [email or web address] 04/01/2012, 10:30 AM   LOS: 6 days

## 2012-04-01 NOTE — Progress Notes (Signed)
NUTRITION FOLLOW UP  Intervention:   1.  Modify diet; per SLP's recommendation to Dysphagia 1, thin liquids.  Paged MD.  Nutrition Dx:   Inadequate oral intake, ongoing  Monitor:   1.  Food/Beverage; diet advancement as medically appropriate per MD/SLP.  Somewhat met, pt has resumed full liquids with accepted risk  Assessment:   Pt admitted with aspiration pneumonia s/p episode of choking. Pt has been followed by SLP for appropriate PO diet as this is pt/family preference for pt at this time.  SLP recommended puree/thin liquids.  Pt has maintained full liquid diet for the past 2-3 days with some coughing, but overall tolerance. RD discussed with SLP to confirm that Puree with accepted risk was appropriate for pt.  Paged MD per pt's request to request diet liberalization.  Height: Ht Readings from Last 1 Encounters:  03/29/12 5\' 3"  (1.6 m)    Weight Status:   Wt Readings from Last 1 Encounters:  03/29/12 141 lb 15.6 oz (64.4 kg)    Re-estimated needs:  Kcal: 1500-1650 Protein: 60-75g Fluid: >1.5 L/day  Skin: intact  Diet Order: Full Liquid   Intake/Output Summary (Last 24 hours) at 04/01/12 1326 Last data filed at 04/01/12 1243  Gross per 24 hour  Intake   2005 ml  Output   5900 ml  Net  -3895 ml    Last BM: 1/22   Labs:   Lab 04/01/12 0544 03/30/12 0127 03/29/12 0346 03/28/12 0354  NA 143 140 136 --  K 2.9* 3.7 3.2* --  CL 106 108 102 --  CO2 27 21 24  --  BUN 5* 8 11 --  CREATININE 0.96 0.98 1.00 --  CALCIUM 8.2* 8.7 8.6 --  MG 1.3* -- -- 1.3*  PHOS -- -- -- --  GLUCOSE 101* 87 115* --    CBG (last 3)  No results found for this basename: GLUCAP:3 in the last 72 hours  Scheduled Meds:   . antiseptic oral rinse  15 mL Mouth Rinse BID  . aspirin EC  81 mg Oral Daily  . clomiPRAMINE  100 mg Oral QHS  . clonazePAM  0.5 mg Oral TID  . clonazePAM  1 mg Oral Daily  . enoxaparin (LOVENOX) injection  40 mg Subcutaneous Q24H  . levofloxacin (LEVAQUIN) IV   750 mg Intravenous Q24H  . pantoprazole  40 mg Oral Daily  . piperacillin-tazobactam (ZOSYN)  IV  3.375 g Intravenous Q8H  . ranitidine  150 mg Oral QHS  . sertraline  200 mg Oral Daily  . valproate sodium  500 mg Intravenous Q12H  . vancomycin  750 mg Intravenous Q24H    Continuous Infusions:   . dextrose 5 % and 0.9% NaCl 1,000 mL with potassium chloride 20 mEq infusion 150 mL/hr at 04/01/12 1045    Loyce Dys, MS RD LDN Clinical Inpatient Dietitian Pager: [phone] Weekend/After hours pager: [phone]

## 2012-04-02 DIAGNOSIS — M79609 Pain in unspecified limb: Secondary | ICD-10-CM

## 2012-04-02 DIAGNOSIS — R609 Edema, unspecified: Secondary | ICD-10-CM

## 2012-04-02 LAB — BASIC METABOLIC PANEL
GFR calc Af Amer: 72 mL/min — ABNORMAL LOW (ref >90)
GFR calc non Af Amer: 62 mL/min — ABNORMAL LOW (ref >90)
Potassium: 3.4 mEq/L — ABNORMAL LOW (ref 3.5–5.1)
Sodium: 143 mEq/L (ref 135–145)

## 2012-04-02 LAB — CBC WITH DIFFERENTIAL/PLATELET
Basophils Absolute: 0.4 10*3/uL — ABNORMAL HIGH (ref 0.0–0.1)
Lymphocytes Relative: 13 % (ref 12–46)
Monocytes Relative: 12 % (ref 3–12)
Platelets: 230 10*3/uL (ref 150–400)
RDW: 17.7 % — ABNORMAL HIGH (ref 11.5–15.5)
WBC: 22.3 10*3/uL — ABNORMAL HIGH (ref 4.0–10.5)

## 2012-04-02 LAB — PROCALCITONIN: Procalcitonin: <0.1 ng/mL

## 2012-04-02 NOTE — Progress Notes (Signed)
[facility] PROGRESS NOTE  Sydney Hatfield JYN:[id number] DOB: [birth year] DOA: 03/26/2012 PCP: Mickie Hillier, MD  Assessment/Plan: Sepsis with acute respiratory failure likely secondary to aspiration pneumonia  -Patient admitted to step down and started on IV vancomycin , zosyn and Levaquin D#7--plan to discontinue all IV antibiotics today (04/02/2012) as patient has completed 7 days of intravenous antibiotics -repeat CXR PA lateral shows bibasilar opacities. Patient febrile, tachycardic and tachypnic on 1/17. Was hypotensive and tachypnic overnight on day 2 requiring 500 ccx 2 IV NS bolus on 1/17. ABG was stable.  -afebrile past 72 hrs. RR and sats normal  -1/17/14Blood cx negative. Rapid flu negative. Legionella ag pending.  -antitussive for cough  Leukocytosis  -WBC increased to 19,500--04/01/12, 22.3 on 04/02/2012 -UA with urine culture--does not suggest UTI -Blood cultures x2 sets 04/01/2012--negative to date -Stool for C. difficile PCR -Procalcitonin less than 0.10 Elevated d-dimer  -Patient complains of intermittent shortness of breath  -CT angiogram chest-negative for pulmonary embolus, shows bilateral patchy air space infiltrates, most severe right upper lobe -Venous duplex lower extremities--preliminary read is negative Dysphagia with risk for aspiration  -seen by swallow eval and high risk for aspiration. Made NPO.  -spoken with her neurologist Dr. Herminio Heads at [facility] on 1/17. 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 Her neurologist does not agree that aspiration is caused by effect of the botulinum toxin. Patient has received her third dose of Botulinum toxin this year and her neurologist feels the risk of aspiration with botulinum toxin is not common.  -Continue head of bed elevation.  -Counseled patient on eating slowly and quiet environment  -Patient failed MBS with high aspiration  risk 03/29/2012 . Mother wants patient to eat Despite high aspiration risk. ( unable to decide on alternate means of feeding) Kept on pureed diet .  -Although risk of aspiration remains, mother does not wish for gastrostomy tube at this time  History of seizure disorder  Depakote level is therapeutic. Continue current dose of Depakote.  History of mental retardation with agitation.  Continue her home dose of Klonopin. Continue Zoloft  -Restart clomipramine  Chronic torticollis  Patient has been taking botulinum toxin toxin injection every 3 months for past 9 months and follows with Dr. Herminio Heads at [facility]. Last injection received 1 months back. Discussed plan with Dr Marya Fossa.  Iron Deficiency anemia  Continue iron supplements   Hypokalemia/hypomagnesemia  replenish  -Recheck labs in the am  Tachycardia  -Suspect volume depletion as patient remains -3.5 L for the admission  -Overall improving but remains tachycardic  -Remain on telemetry  -Continue IV fluids--increase rate as pt is neg fluid balance  -EKG-no acute ST-T wave changes Code Status: Full code  Family Communication: Mother updated at bedside  Disposition Plan: Plan d/c to the group home when clinically stable     Antibiotics:  Vancomycin/Zosyn/Levaquin 03/26/2012>>> 04/02/2012    Procedures/Studies: Dg Chest 1 View  03/26/2012  *RADIOLOGY REPORT*  Clinical Data: Fever, respiratory distress.  CHEST - 1 VIEW  Comparison: 03/23/2012  Findings: Left retrocardiac linear scarring or atelectasis.  Low lung volumes with resultant crowding of bronchovascular structures. Suspect mild central pulmonary vascular congestion and some mild bibasilar interstitial edema or infiltrates.  No confluent airspace disease.  No effusion.  Heart size upper limits normal.  Regional bones unremarkable.  IMPRESSION:  Low volumes with suspicion of central pulmonary vascular congestion and mild bibasilar interstitial edema.  Original Report Authenticated By: D. Andria Rhein, MD    Dg Chest 2 View  03/26/2012  *RADIOLOGY REPORT*  Clinical Data: Evaluate for pneumonia.  CHEST - 2 VIEW  Comparison: Earlier today at both 51 hours.  Findings: Degraded lateral view secondary position.  Mildly motion degraded frontal view. Patient rotated right.  Borderline cardiomegaly, accentuated by AP portable technique.  Probable small bilateral pleural effusions on the lateral. No pneumothorax.  The chin overlies the apices.  Development of patchy bibasilar airspace disease.  IMPRESSION: Developing bibasilar airspace disease, which could represent atelectasis or infection.  Probable small bilateral pleural effusions.  Multifactorial degradation.   Original Report Authenticated By: Jeronimo Greaves, M.D.    Ct Head Wo Contrast  03/23/2012  *RADIOLOGY REPORT*  Clinical Data:  ALTERED MENTAL STATUS,neck pain, torticolis,  h/o pizza choking.  CT HEAD WITHOUT CONTRAST CT CERVICAL SPINE WITHOUT CONTRAST  Technique:  Multidetector CT imaging of the head and cervical spine was performed following the standard protocol without IV contrast. Multiplanar CT image reconstructions of the cervical spine were also generated.  Comparison: 09/13/2010  CT HEAD  Findings: Diffuse parenchymal atrophy. Patchy areas of hypoattenuation in deep and periventricular white matter bilaterally. Negative for acute intracranial hemorrhage, mass lesion, acute infarction, midline shift, or mass-effect. Acute infarct may be inapparent on noncontrast CT. Ventricles and sulci symmetric. Bone windows demonstrate no focal lesion.  IMPRESSION:  1. Negative for bleed or other acute intracranial process.  2. Atrophy and nonspecific white matter changes  CT CERVICAL SPINE  Findings: Normal alignment.  Vertebral body and intervertebral disc height well maintained throughout.  Fusion across the left C3-4 facet.  No prevertebral soft tissue swelling.  Negative for fracture.  Bilateral partially  calcified carotid bifurcation plaque.  IMPRESSION: 1.  Negative for fracture or other acute bony abnormality. 2.  Fusion across the left C3-4 facet.   Original Report Authenticated By: D. Andria Rhein, MD    Ct Angio Chest Pe W/cm &/or Wo Cm  04/01/2012  *RADIOLOGY REPORT*  Clinical Data: Shortness of breath, elevated D-dimer, question pulmonary embolism, known aspiration pneumonia  CT ANGIOGRAPHY CHEST  Technique:  Multidetector CT imaging of the chest using the standard protocol during bolus administration of intravenous contrast. Multiplanar reconstructed images including MIPs were obtained and reviewed to evaluate the vascular anatomy.  Contrast: OMNIPAQUE IOHEXOL 350 MG/ML SOLN  Comparison: None  Findings: Aorta normal caliber without aneurysm or dissection. Atherosclerotic calcifications coronary arteries. Visualized portion of upper abdomen unremarkable. Respiratory motion artifacts degrade assessment of the lung bases. No definite pulmonary emboli identified. Small bilateral pleural effusions and lower lobe subsegmental atelectasis. Minimally enlarged precarinal lymph node. Patchy bilateral airspace infiltrates most severe in the right upper lobe. No pneumothorax or acute osseous findings.  IMPRESSION: No evidence of pulmonary embolism identified though assessment of the lower lobe pulmonary arterial branches is suboptimal due to respiratory motion artifacts. Patchy bilateral airspace infiltrates most severe in the right upper lobe. Bibasilar pleural effusions and atelectasis. Single nonspecific minimally enlarged precarinal lymph node.   Original Report Authenticated By: Ulyses Southward, M.D.    Ct Cervical Spine Wo Contrast  03/23/2012  *RADIOLOGY REPORT*  Clinical Data:  ALTERED MENTAL STATUS,neck pain, torticolis,  h/o pizza choking.  CT HEAD WITHOUT CONTRAST CT CERVICAL SPINE WITHOUT CONTRAST  Technique:  Multidetector CT imaging of the head and cervical spine was performed following the standard  protocol without IV contrast. Multiplanar CT image reconstructions of the cervical spine were also generated.  Comparison: 09/13/2010  CT HEAD  Findings: Diffuse parenchymal atrophy. Patchy areas of hypoattenuation in deep and periventricular white matter bilaterally. Negative for acute intracranial hemorrhage, mass lesion, acute infarction, midline shift, or mass-effect. Acute infarct may be inapparent on noncontrast CT. Ventricles and sulci symmetric. Bone windows demonstrate no focal lesion.  IMPRESSION:  1. Negative for bleed or other acute intracranial process.  2. Atrophy and nonspecific white matter changes  CT CERVICAL SPINE  Findings: Normal alignment.  Vertebral body and intervertebral disc height well maintained throughout.  Fusion across the left C3-4 facet.  No prevertebral soft tissue swelling.  Negative for fracture.  Bilateral partially calcified carotid bifurcation plaque.  IMPRESSION: 1.  Negative for fracture or other acute bony abnormality. 2.  Fusion across the left C3-4 facet.   Original Report Authenticated By: D. Andria Rhein, MD    Dg Chest Portable 1 View  03/22/2012  *RADIOLOGY REPORT*  Clinical Data: Altered mental status.  PORTABLE CHEST - 1 VIEW  Comparison: 03/04/2012  Findings: Coarse bibasilar interstitial markings, probably emphasized by low lung volumes.  No confluent airspace infiltrate. No effusion.  Heart size upper limits normal.  Regional bones unremarkable.  IMPRESSION:  1.  Low volumes.  No definite acute disease.   Original Report Authenticated By: D. Andria Rhein, MD    Dg Swallowing [facility] Pathology  03/29/2012  Chales Abrahams, CCC-SLP     03/29/2012 11:38 AM Objective Swallowing Evaluation: Modified Barium Swallowing Study   Patient Details  Name: Sydney Hatfield MRN: [id number] Date of Birth: 06-Aug-1955  Today's Date: 03/29/2012 Time: 1005-1055 SLP Time Calculation (min): 50 min  Past Medical History:  Past Medical History  Diagnosis Date  . Autistic  spectrum disorder   . Anxiety disorder   . Seizure   . MR (mental retardation), moderate   . Torticollis    Past Surgical History:  Past Surgical History  Procedure Date  . Colonoscopy   . Breast biopsy    HPI:  57 yo female h/o moderate mental retardation, autism, seizure  disorder 2 days ago at her group home choked while eating some  stringy cheese someone had to do the hemlick maneuver on her  which was successful.  She was brought to the ED and evaluated  and was going to be admitted but her mother signed her out AMA  back to the group home.  Pt then admitted next date with sob and  coughing and fever diagnosed with pna.  Pt is now on 4 liters of  oxygen after requiring NR previously.  Pt does becomes tachypneic  with exertion.  H/O spasmodic torticollis present and pt receives  botox injections every 3 months.     Assessment / Plan / Recommendation Clinical Impression  Dysphagia Diagnosis: Moderate oral phase dysphagia;Moderate  pharyngeal phase dysphagia;Moderate cervical esophageal phase  dysphagia  Clinical impression: Moderate oropharyngeal and suspected  cervical esophageal dysphagia present suspected due to  torticollis, MR and deconditioning.    Dysphagia characterized by weakness/decr oral control resulting  in premature spillage of barium into pharynx and decr tongue base  retraction resulting in pharyngeal stasis whic pt does not sense.   Dry swallows help to decrease pharyngeal residuals but do not  clear them and pt did not follow directions to conduct further  dry swallows.  Appearance of decreased UES opening, ? 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   Fortunately pt has large pyriform sinuses that gathers barium.   Please note, pt coughed throughout entire  MBS and barium was not  seen in larynx or trachea.  Her aspiration risk remains high due  to her sensoriomotor dysphagia, decreased ability to follow  directions to dry swallow to clear residuals and  deconditioning.      Based on report from her mother that the pt will cough on  occasion with intake and h/o MBS completion years prior, suspect  chronic dysphagia that pt typically manages.  Mother states she  would like pt to consume intake with KNOWN risks (recurrent  pulmonary infections, aspiration) and strategies to maximiize  airway protection with MD permission.  Pt is particular about  what she eats but eating gives her QOL per mother.  Educated  mother to importance of monitoring po tolerance, dry swallows to  decr residuals, assuring pt not tachypenic, etc with intake.    Please note:  Pt's head was in chin tuck position as she did not  hold it up even with max cues.  May be beneficial for airway  protection.  Unable to assess swallow in head neutral, which is  position mother states is pt's normal position.   Will inform MD to results and mother's desire and will follow.      Treatment Recommendation  Therapy as outlined in treatment plan below   Diet Recommendation NPO      OR       Puree/thin (currently) if  MD and mother agree with acceptance of aspiration risks    If pt to initiate diet recommend the following strategies  Multiple swallows with EACH bite/sip  Follow solids with liquids Chin tuck position Crush medicine given with applesauce - follow with water Rest breaks if with tachypnea or overtly coughing Sit upright for at least 30 minutes after meals      Other  Recommendations Oral Care Recommendations: Oral care QID   Follow Up Recommendations  Other (comment) (TBD) TBD   Frequency and Duration min 2x/week  2 weeks   Pertinent Vitals/Pain Low grade temp, lungs decreased- WEAK  nonproductive cough    SLP Swallow Goals Patient will utilize recommended strategies during swallow to  increase swallowing safety with: Total assistance Goal #3: Pt's mother/MD will determine if pt to consume po with  accepted asp risks and strategies in place to minimize aspiration  independently.    General Date of  Onset: 03/15/12 HPI: 57 yo female h/o moderate mental retardation, autism,  seizure disorder 2 days ago at her group home choked while eating  some stringy cheese someone had to do the hemlick maneuver on her  which was successful.  She was brought to the ED and evaluated  and was going to be admitted but her mother signed her out AMA  back to the group home.  Pt then admitted next date with sob and  coughing and fever diagnosed with pna.  Pt is now on 4 liters of  oxygen after requiring NR previously.  Pt does becomes tachypneic  with exertion.  H/O spasmodic torticollis present and pt receives  botox injections every 3 months. Type of Study: Modified Barium Swallowing Study Reason for Referral: Objectively evaluate swallowing function Previous Swallow Assessment: Mother reports patient has had a MBS  several years ago, but not in the [facility]. Diet Prior to this Study: NPO Respiratory Status: Supplemental O2 delivered via (comment) (NRB  mask 50%) History of Recent Intubation: No Behavior/Cognition: Alert;Distractible;Requires cueing;Doesn't  follow directions;Decreased sustained attention;Impulsive Oral Cavity - Dentition: Adequate natural dentition Oral Motor / Sensory Function:  Impaired - see Bedside swallow  eval Self-Feeding Abilities: Able to feed self;Needs assist Patient Positioning: Upright in chair Baseline Vocal Quality: Low vocal intensity Volitional Cough: Cognitively unable to elicit Volitional Swallow: Unable to elicit Anatomy: Other (Comment) (difficult to view due to motion  degradation)    Reason for Referral Objectively evaluate swallowing function   Oral Phase Oral Preparation/Oral Phase Oral Phase: Impaired Oral - Nectar Oral - Nectar Teaspoon: Piecemeal swallowing;Weak lingual  manipulation;Reduced posterior propulsion Oral - Nectar Cup: Piecemeal swallowing;Weak lingual  manipulation;Reduced posterior propulsion Oral - Thin Oral - Thin Teaspoon: Piecemeal swallowing;Weak lingual   manipulation;Reduced posterior propulsion;Lingual/palatal residue Oral - Thin Cup: Piecemeal swallowing;Weak lingual  manipulation;Reduced posterior propulsion;Lingual/palatal residue Oral - Thin Straw: Piecemeal swallowing;Reduced posterior  propulsion;Lingual/palatal residue Oral - Solids Oral - Puree: Piecemeal swallowing;Weak lingual  manipulation;Reduced posterior propulsion Oral - Regular: Piecemeal swallowing;Weak lingual  manipulation;Reduced posterior propulsion;Impaired mastication Oral Phase - Comment Oral Phase - Comment: pt essentially allows boluses to spill into  pharynx uncontrolled due to lingual weakness/discoordination   Pharyngeal Phase Pharyngeal Phase Pharyngeal Phase: Impaired Pharyngeal - Nectar Pharyngeal - Nectar Teaspoon: Delayed swallow  initiation;Pharyngeal residue - pyriform sinuses;Lateral channel  residue;Premature spillage to valleculae;Reduced tongue base  retraction Pharyngeal - Nectar Cup: Lateral channel residue;Premature  spillage to valleculae;Reduced tongue base retraction Pharyngeal - Thin Pharyngeal - Thin Teaspoon: Lateral channel residue;Pharyngeal  residue - pyriform sinuses;Pharyngeal residue -  valleculae;Premature spillage to valleculae;Reduced tongue base  retraction Pharyngeal - Thin Cup: Pharyngeal residue - pyriform  sinuses;Lateral channel residue Pharyngeal - Thin Straw: Pharyngeal residue - pyriform  sinuses;Lateral channel residue Pharyngeal - Solids Pharyngeal - Puree: Delayed swallow initiation;Premature spillage  to valleculae;Pharyngeal residue - valleculae;Pharyngeal residue  - posterior pharnyx;Pharyngeal residue - pyriform sinuses Pharyngeal - Regular: Premature spillage to valleculae;Delayed  swallow initiation;Pharyngeal residue - valleculae Pharyngeal Phase - Comment Pharyngeal Comment: dry swallows decrease residuals but do not  eliminate them, pt's mother states pt was not cooperative  Cervical Esophageal Phase    GO    Cervical Esophageal Phase  Cervical Esophageal Phase: Impaired Cervical Esophageal Phase - Nectar Nectar Teaspoon: Reduced cricopharyngeal relaxation Nectar Cup: Reduced cricopharyngeal relaxation Cervical Esophageal Phase - Thin Thin Teaspoon: Reduced cricopharyngeal relaxation Thin Cup: Reduced cricopharyngeal relaxation Thin Straw: Reduced cricopharyngeal relaxation Cervical Esophageal Phase - Solids Puree: Reduced cricopharyngeal relaxation Regular: Reduced cricopharyngeal relaxation Cervical Esophageal Phase - Comment Cervical Esophageal Comment: 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, appearance of intermittent stasis at distal  esophagus with retrograde propulsion without pt  awarness/sensation         Donavan Burnet, MS [facility] SLP [id number]           Subjective: Patient denies headache, fevers, chills, chest pain,  nausea, vomiting, abdominal pain. She has some loose stools. No rashes. Denies tissue area or hematuria. She has intermittent shortness of breath  Objective: Filed Vitals:   04/01/12 0634 04/01/12 1407 04/01/12 2138 04/02/12 0404  BP: 139/79 135/86 137/88 143/88  Pulse: 108 121 119 110  Temp: 98.2 F (36.8 C) 97.8 F (36.6 C) 98.1 F (36.7 C) 97.9 F (36.6 C)  TempSrc: Oral Oral Oral Oral  Resp: 17 20 18 18   Height:      Weight:      SpO2: 96% 92% 94% 95%    Intake/Output Summary (Last 24 hours) at 04/02/12 1342 Last data filed at 04/02/12 1100  Gross per 24 hour  Intake    600 ml  Output   2200 ml  Net  -1600  ml   Weight change:  Exam:   General:  Pt is alert, follows commands appropriately, not in acute distress  HEENT: No icterus, No thrush, No neck mass, [place]/AT  Cardiovascular: RRR, S1/S2, no rubs, no gallops  Respiratory: Bibasilar crackles. No wheezes or rhonchi. Good air movement.  Abdomen: Soft/+BS, non tender, non distended, no guarding  Extremities: trace edema, No lymphangitis, No petechiae, No rashes, no synovitis  Data Reviewed: Basic Metabolic  Panel:  Lab 04/02/12 0456 04/01/12 0544 03/30/12 0127 03/29/12 0346 03/28/12 0354  NA 143 143 140 136 133*  K 3.4* 2.9* 3.7 3.2* 3.0*  CL 105 106 108 102 101  CO2 28 27 21 24 22   GLUCOSE 97 101* 87 115* 94  BUN 5* 5* 8 11 7   CREATININE 1.00 0.96 0.98 1.00 0.64  CALCIUM 8.3* 8.2* 8.7 8.6 8.4  MG 1.8 1.3* -- -- 1.3*  PHOS -- -- -- -- --   Liver Function Tests: No results found for this basename: AST:5,ALT:5,ALKPHOS:5,BILITOT:5,PROT:5,ALBUMIN:5 in the last 168 hours No results found for this basename: LIPASE:5,AMYLASE:5 in the last 168 hours No results found for this basename: AMMONIA:5 in the last 168 hours CBC:  Lab 04/02/12 0456 04/01/12 0544 03/28/12 0354  WBC 22.3* 19.5* 5.7  NEUTROABS 16.1* 14.1* --  HGB 9.9* 9.7* 10.2*  HCT 29.8* 28.7* 30.1*  MCV 87.6 86.7 86.2  PLT 230 187 169   Cardiac Enzymes: No results found for this basename: CKTOTAL:5,CKMB:5,CKMBINDEX:5,TROPONINI:5 in the last 168 hours BNP: No components found with this basename: POCBNP:5 CBG: No results found for this basename: GLUCAP:5 in the last 168 hours  Recent Results (from the past 240 hour(s))  CULTURE, BLOOD (ROUTINE X 2)     Status: Normal   Collection Time   03/26/12 12:55 AM      Component Value Range Status Comment   Specimen Description BLOOD HAC   Final    Special Requests NONE BOTTLES DRAWN AEROBIC AND ANAEROBIC 5CC   Final    Culture  Setup Time 03/26/2012 11:07   Final    Culture NO GROWTH 5 DAYS   Final    Report Status 04/01/2012 FINAL   Final   URINE CULTURE     Status: Normal   Collection Time   03/26/12  1:26 AM      Component Value Range Status Comment   Specimen Description URINE, CATHETERIZED   Final    Special Requests NONE   Final    Culture  Setup Time 03/26/2012 14:22   Final    Colony Count NO GROWTH   Final    Culture NO GROWTH   Final    Report Status 03/27/2012 FINAL   Final   MRSA PCR SCREENING     Status: Normal   Collection Time   03/26/12  4:08 AM      Component  Value Range Status Comment   MRSA by PCR NEGATIVE  NEGATIVE Final   CULTURE, BLOOD (ROUTINE X 2)     Status: Normal   Collection Time   03/26/12  5:30 AM      Component Value Range Status Comment   Specimen Description BLOOD LEFT HAND   Final    Special Requests BOTTLES DRAWN AEROBIC ONLY 1CC   Final    Culture  Setup Time 03/26/2012 11:07   Final    Culture NO GROWTH 5 DAYS   Final    Report Status 04/01/2012 FINAL   Final   CULTURE, EXPECTORATED SPUTUM-ASSESSMENT  Status: Normal   Collection Time   03/26/12  7:51 AM      Component Value Range Status Comment   Specimen Description SPUTUM   Final    Special Requests Normal   Final    Sputum evaluation     Final    Value: THIS SPECIMEN IS ACCEPTABLE. RESPIRATORY CULTURE REPORT TO FOLLOW.   Report Status 03/26/2012 FINAL   Final   CULTURE, RESPIRATORY     Status: Normal   Collection Time   03/26/12  7:51 AM      Component Value Range Status Comment   Specimen Description SPUTUM   Final    Special Requests NONE   Final    Gram Stain     Final    Value: ABUNDANT WBC PRESENT,BOTH PMN AND MONONUCLEAR     FEW SQUAMOUS EPITHELIAL CELLS PRESENT     RARE GRAM POSITIVE COCCI     IN PAIRS RARE GRAM POSITIVE RODS     RARE GRAM NEGATIVE RODS   Culture NORMAL OROPHARYNGEAL FLORA   Final    Report Status 03/28/2012 FINAL   Final   CULTURE, BLOOD (ROUTINE X 2)     Status: Normal (Preliminary result)   Collection Time   04/01/12 11:10 AM      Component Value Range Status Comment   Specimen Description BLOOD RIGHT HAND   Final    Special Requests BOTTLES DRAWN AEROBIC AND ANAEROBIC 6CC   Final    Culture  Setup Time 04/01/2012 15:03   Final    Culture     Final    Value:        BLOOD CULTURE RECEIVED NO GROWTH TO DATE CULTURE WILL BE HELD FOR 5 DAYS BEFORE ISSUING A FINAL NEGATIVE REPORT   Report Status PENDING   Incomplete   CULTURE, BLOOD (ROUTINE X 2)     Status: Normal (Preliminary result)   Collection Time   04/01/12 11:20 AM       Component Value Range Status Comment   Specimen Description BLOOD LEFT HAND   Final    Special Requests BOTTLES DRAWN AEROBIC AND ANAEROBIC 6CC   Final    Culture  Setup Time 04/01/2012 15:03   Final    Culture     Final    Value:        BLOOD CULTURE RECEIVED NO GROWTH TO DATE CULTURE WILL BE HELD FOR 5 DAYS BEFORE ISSUING A FINAL NEGATIVE REPORT   Report Status PENDING   Incomplete      Scheduled Meds:   . antiseptic oral rinse  15 mL Mouth Rinse BID  . aspirin EC  81 mg Oral Daily  . clomiPRAMINE  100 mg Oral QHS  . clonazePAM  0.5 mg Oral TID  . clonazePAM  1 mg Oral Daily  . enoxaparin (LOVENOX) injection  40 mg Subcutaneous Q24H  . pantoprazole  40 mg Oral Daily  . ranitidine  150 mg Oral QHS  . sertraline  200 mg Oral Daily  . valproate sodium  500 mg Intravenous Q12H   Continuous Infusions:   . dextrose 5 % and 0.9% NaCl 1,000 mL with potassium chloride 20 mEq infusion 150 mL/hr at 04/02/12 0608     Camryn Quesinberry, DO  [facility] Pager [phone]  If 7PM-7AM, please contact night-coverage [email or web address] Password [email or web address] 04/02/2012, 1:42 PM   LOS: 7 days

## 2012-04-02 NOTE — Progress Notes (Signed)
VASCULAR LAB PRELIMINARY  PRELIMINARY  PRELIMINARY  PRELIMINARY  Bilateral lower extremity venous duplex completed.    Preliminary report:  Bilateral:  No evidence of DVT, superficial thrombosis, or Baker's Cyst.   Annelle Behrendt, RVS 04/02/2012, 11:56 AM

## 2012-04-03 ENCOUNTER — Inpatient Hospital Stay (HOSPITAL_COMMUNITY): Payer: Medicare Other

## 2012-04-03 DIAGNOSIS — R4182 Altered mental status, unspecified: Secondary | ICD-10-CM

## 2012-04-03 DIAGNOSIS — D72829 Elevated white blood cell count, unspecified: Secondary | ICD-10-CM

## 2012-04-03 LAB — CBC WITH DIFFERENTIAL/PLATELET
Basophils Relative: 1 % (ref 0–1)
Eosinophils Absolute: 0.7 10*3/uL (ref 0.0–0.7)
Eosinophils Relative: 3 % (ref 0–5)
HCT: 27.4 % — ABNORMAL LOW (ref 36.0–46.0)
Hemoglobin: 9 g/dL — ABNORMAL LOW (ref 12.0–15.0)
MCH: 28.8 pg (ref 26.0–34.0)
MCHC: 32.8 g/dL (ref 30.0–36.0)
Monocytes Absolute: 2 10*3/uL — ABNORMAL HIGH (ref 0.1–1.0)
Neutro Abs: 15.8 10*3/uL — ABNORMAL HIGH (ref 1.7–7.7)

## 2012-04-03 LAB — URINE CULTURE

## 2012-04-03 LAB — COMPREHENSIVE METABOLIC PANEL
ALT: 8 U/L (ref 0–35)
CO2: 24 mEq/L (ref 19–32)
Calcium: 7.9 mg/dL — ABNORMAL LOW (ref 8.4–10.5)
Chloride: 106 mEq/L (ref 96–112)
Creatinine, Ser: 0.91 mg/dL (ref 0.50–1.10)
GFR calc Af Amer: 80 mL/min — ABNORMAL LOW (ref >90)
GFR calc non Af Amer: 69 mL/min — ABNORMAL LOW (ref >90)
Glucose, Bld: 101 mg/dL — ABNORMAL HIGH (ref 70–99)
Sodium: 138 mEq/L (ref 135–145)
Total Bilirubin: 0.2 mg/dL — ABNORMAL LOW (ref 0.3–1.2)

## 2012-04-03 MED ORDER — IOHEXOL 300 MG/ML  SOLN
100.0000 mL | Freq: Once | INTRAMUSCULAR | Status: AC | PRN
  Administered 2012-04-03: 100 mL via INTRAVENOUS

## 2012-04-03 MED ORDER — IOHEXOL 300 MG/ML  SOLN
25.0000 mL | INTRAMUSCULAR | Status: AC
  Administered 2012-04-03 (×2): 25 mL via ORAL

## 2012-04-03 MED ORDER — PANTOPRAZOLE SODIUM 40 MG PO PACK
40.0000 mg | PACK | Freq: Every day | ORAL | Status: DC
  Administered 2012-04-03 – 2012-04-04 (×2): 40 mg
  Filled 2012-04-03 (×2): qty 20

## 2012-04-03 MED ORDER — ASPIRIN 81 MG PO CHEW
81.0000 mg | CHEWABLE_TABLET | Freq: Every day | ORAL | Status: DC
  Administered 2012-04-03 – 2012-04-04 (×2): 81 mg via ORAL
  Filled 2012-04-03 (×2): qty 1

## 2012-04-03 NOTE — Progress Notes (Signed)
[facility] PROGRESS NOTE  Sydney Hatfield RUE:[id number] DOB: April 13, 1955 DOA: 03/26/2012 PCP: Mickie Hillier, MD  Assessment/Plan: Sepsis with acute respiratory failure likely secondary to aspiration pneumonia  -Patient admitted to step down and started on IV vancomycin , zosyn and Levaquin D#7--plan to discontinue all IV antibiotics today (04/02/2012) as patient has completed 7 days of intravenous antibiotics  -repeat CXR PA lateral shows bibasilar opacities. Patient febrile, tachycardic and tachypnic on 1/17. Was hypotensive and tachypnic overnight on day 2 requiring 500 ccx 2 IV NS bolus on 1/17. ABG was stable.  -afebrile past 72 hrs. RR and sats normal  -1/17/14Blood cx negative. Rapid flu negative -antitussive for cough  Leukocytosis  -slight improvement today to 21.7, remains afebrile -WBC increased to 19,500--04/01/12, 22.3 on 04/02/2012  -UA with urine culture--does not suggest UTI  -Blood cultures x2 sets 04/01/2012--negative to date  -Stool for C. difficile PCR  -Procalcitonin less than 0.10  -remain off antibiotics -mother desires CT abdomen and pelvis Elevated d-dimer  -Patient complains of intermittent shortness of breath  -CT angiogram chest-negative for pulmonary embolus, shows bilateral patchy air space infiltrates, most severe right upper lobe  -Venous duplex lower extremities-- negative  Dysphagia with risk for aspiration  -seen by swallow eval and high risk for aspiration. Made NPO.  -spoken with her neurologist Dr. Herminio Heads at [facility] on 1/17. 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 Her neurologist does not agree that aspiration is caused by effect of the botulinum toxin. Patient has received her third dose of Botulinum toxin this year and her neurologist feels the risk of aspiration with botulinum toxin is not common.  -Continue head of bed elevation.  -Counseled patient  on eating slowly and quiet environment  -Patient failed MBS with high aspiration risk 03/29/2012 . Mother wants patient to eat Despite high aspiration risk. ( unable to decide on alternate means of feeding) Kept on pureed diet .  -Although risk of aspiration remains, mother does not wish for gastrostomy tube at this time  History of seizure disorder  Depakote level is therapeutic. Continue current dose of Depakote.  History of mental retardation with agitation.  Continue her home dose of Klonopin. Continue Zoloft  -Restart clomipramine  Chronic torticollis  Patient has been taking botulinum toxin toxin injection every 3 months for past 9 months and follows with Dr. Herminio Heads at [facility]. Last injection received 1 months back. Discussed plan with Dr Marya Fossa.  Iron Deficiency anemia  Continue iron supplements      Family Communication:   Pt at beside Disposition Plan:   Group home when medically stable      Procedures/Studies: Dg Chest 1 View  03/26/2012  *RADIOLOGY REPORT*  Clinical Data: Fever, respiratory distress.  CHEST - 1 VIEW  Comparison: 03/23/2012  Findings: Left retrocardiac linear scarring or atelectasis.  Low lung volumes with resultant crowding of bronchovascular structures. Suspect mild central pulmonary vascular congestion and some mild bibasilar interstitial edema or infiltrates.  No confluent airspace disease.  No effusion.  Heart size upper limits normal.  Regional bones unremarkable.  IMPRESSION:  Low volumes with suspicion of central pulmonary vascular congestion and mild bibasilar interstitial edema.   Original Report Authenticated By: D. Andria Rhein, MD    Dg Chest 2 View  03/26/2012  *RADIOLOGY REPORT*  Clinical Data: Evaluate for pneumonia.  CHEST - 2 VIEW  Comparison: Earlier today at both 51 hours.  Findings: Degraded lateral view  secondary position.  Mildly motion degraded frontal view. Patient rotated right.  Borderline cardiomegaly, accentuated  by AP portable technique.  Probable small bilateral pleural effusions on the lateral. No pneumothorax.  The chin overlies the apices.  Development of patchy bibasilar airspace disease.  IMPRESSION: Developing bibasilar airspace disease, which could represent atelectasis or infection.  Probable small bilateral pleural effusions.  Multifactorial degradation.   Original Report Authenticated By: Jeronimo Greaves, M.D.    Ct Head Wo Contrast  03/23/2012  *RADIOLOGY REPORT*  Clinical Data:  ALTERED MENTAL STATUS,neck pain, torticolis,  h/o pizza choking.  CT HEAD WITHOUT CONTRAST CT CERVICAL SPINE WITHOUT CONTRAST  Technique:  Multidetector CT imaging of the head and cervical spine was performed following the standard protocol without IV contrast. Multiplanar CT image reconstructions of the cervical spine were also generated.  Comparison: 09/13/2010  CT HEAD  Findings: Diffuse parenchymal atrophy. Patchy areas of hypoattenuation in deep and periventricular white matter bilaterally. Negative for acute intracranial hemorrhage, mass lesion, acute infarction, midline shift, or mass-effect. Acute infarct may be inapparent on noncontrast CT. Ventricles and sulci symmetric. Bone windows demonstrate no focal lesion.  IMPRESSION:  1. Negative for bleed or other acute intracranial process.  2. Atrophy and nonspecific white matter changes  CT CERVICAL SPINE  Findings: Normal alignment.  Vertebral body and intervertebral disc height well maintained throughout.  Fusion across the left C3-4 facet.  No prevertebral soft tissue swelling.  Negative for fracture.  Bilateral partially calcified carotid bifurcation plaque.  IMPRESSION: 1.  Negative for fracture or other acute bony abnormality. 2.  Fusion across the left C3-4 facet.   Original Report Authenticated By: D. Andria Rhein, MD    Ct Angio Chest Pe W/cm &/or Wo Cm  04/01/2012  *RADIOLOGY REPORT*  Clinical Data: Shortness of breath, elevated D-dimer, question pulmonary embolism,  known aspiration pneumonia  CT ANGIOGRAPHY CHEST  Technique:  Multidetector CT imaging of the chest using the standard protocol during bolus administration of intravenous contrast. Multiplanar reconstructed images including MIPs were obtained and reviewed to evaluate the vascular anatomy.  Contrast: OMNIPAQUE IOHEXOL 350 MG/ML SOLN  Comparison: None  Findings: Aorta normal caliber without aneurysm or dissection. Atherosclerotic calcifications coronary arteries. Visualized portion of upper abdomen unremarkable. Respiratory motion artifacts degrade assessment of the lung bases. No definite pulmonary emboli identified. Small bilateral pleural effusions and lower lobe subsegmental atelectasis. Minimally enlarged precarinal lymph node. Patchy bilateral airspace infiltrates most severe in the right upper lobe. No pneumothorax or acute osseous findings.  IMPRESSION: No evidence of pulmonary embolism identified though assessment of the lower lobe pulmonary arterial branches is suboptimal due to respiratory motion artifacts. Patchy bilateral airspace infiltrates most severe in the right upper lobe. Bibasilar pleural effusions and atelectasis. Single nonspecific minimally enlarged precarinal lymph node.   Original Report Authenticated By: Ulyses Southward, M.D.    Ct Cervical Spine Wo Contrast  03/23/2012  *RADIOLOGY REPORT*  Clinical Data:  ALTERED MENTAL STATUS,neck pain, torticolis,  h/o pizza choking.  CT HEAD WITHOUT CONTRAST CT CERVICAL SPINE WITHOUT CONTRAST  Technique:  Multidetector CT imaging of the head and cervical spine was performed following the standard protocol without IV contrast. Multiplanar CT image reconstructions of the cervical spine were also generated.  Comparison: 09/13/2010  CT HEAD  Findings: Diffuse parenchymal atrophy. Patchy areas of hypoattenuation in deep and periventricular white matter bilaterally. Negative for acute intracranial hemorrhage, mass lesion, acute infarction, midline shift,  or mass-effect. Acute infarct may be inapparent on noncontrast CT. Ventricles and sulci  symmetric. Bone windows demonstrate no focal lesion.  IMPRESSION:  1. Negative for bleed or other acute intracranial process.  2. Atrophy and nonspecific white matter changes  CT CERVICAL SPINE  Findings: Normal alignment.  Vertebral body and intervertebral disc height well maintained throughout.  Fusion across the left C3-4 facet.  No prevertebral soft tissue swelling.  Negative for fracture.  Bilateral partially calcified carotid bifurcation plaque.  IMPRESSION: 1.  Negative for fracture or other acute bony abnormality. 2.  Fusion across the left C3-4 facet.   Original Report Authenticated By: D. Andria Rhein, MD    Dg Chest Portable 1 View  03/22/2012  *RADIOLOGY REPORT*  Clinical Data: Altered mental status.  PORTABLE CHEST - 1 VIEW  Comparison: 03/04/2012  Findings: Coarse bibasilar interstitial markings, probably emphasized by low lung volumes.  No confluent airspace infiltrate. No effusion.  Heart size upper limits normal.  Regional bones unremarkable.  IMPRESSION:  1.  Low volumes.  No definite acute disease.   Original Report Authenticated By: D. Andria Rhein, MD    Dg Swallowing [facility] Pathology  03/29/2012  Chales Abrahams, CCC-SLP     03/29/2012 11:38 AM Objective Swallowing Evaluation: Modified Barium Swallowing Study   Patient Details  Name: LAYLAMARIE MEUSER MRN: [id number] Date of Birth: 1955-08-19  Today's Date: 03/29/2012 Time: 1005-1055 SLP Time Calculation (min): 50 min  Past Medical History:  Past Medical History  Diagnosis Date  . Autistic spectrum disorder   . Anxiety disorder   . Seizure   . MR (mental retardation), moderate   . Torticollis    Past Surgical History:  Past Surgical History  Procedure Date  . Colonoscopy   . Breast biopsy    HPI:  57 yo female h/o moderate mental retardation, autism, seizure  disorder 2 days ago at her group home choked while eating some  stringy cheese someone had  to do the hemlick maneuver on her  which was successful.  She was brought to the ED and evaluated  and was going to be admitted but her mother signed her out AMA  back to the group home.  Pt then admitted next date with sob and  coughing and fever diagnosed with pna.  Pt is now on 4 liters of  oxygen after requiring NR previously.  Pt does becomes tachypneic  with exertion.  H/O spasmodic torticollis present and pt receives  botox injections every 3 months.     Assessment / Plan / Recommendation Clinical Impression  Dysphagia Diagnosis: Moderate oral phase dysphagia;Moderate  pharyngeal phase dysphagia;Moderate cervical esophageal phase  dysphagia  Clinical impression: Moderate oropharyngeal and suspected  cervical esophageal dysphagia present suspected due to  torticollis, MR and deconditioning.    Dysphagia characterized by weakness/decr oral control resulting  in premature spillage of barium into pharynx and decr tongue base  retraction resulting in pharyngeal stasis whic pt does not sense.   Dry swallows help to decrease pharyngeal residuals but do not  clear them and pt did not follow directions to conduct further  dry swallows.  Appearance of decreased UES opening, ? 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   Fortunately pt has large pyriform sinuses that gathers barium.   Please note, pt coughed throughout entire MBS and barium was not  seen in larynx or trachea.  Her aspiration risk remains high due  to her sensoriomotor dysphagia, decreased ability to follow  directions to dry swallow to clear residuals and deconditioning.  Based on report from her mother that the pt will cough on  occasion with intake and h/o MBS completion years prior, suspect  chronic dysphagia that pt typically manages.  Mother states she  would like pt to consume intake with KNOWN risks (recurrent  pulmonary infections, aspiration) and strategies to maximiize  airway protection with MD permission.  Pt is  particular about  what she eats but eating gives her QOL per mother.  Educated  mother to importance of monitoring po tolerance, dry swallows to  decr residuals, assuring pt not tachypenic, etc with intake.    Please note:  Pt's head was in chin tuck position as she did not  hold it up even with max cues.  May be beneficial for airway  protection.  Unable to assess swallow in head neutral, which is  position mother states is pt's normal position.   Will inform MD to results and mother's desire and will follow.      Treatment Recommendation  Therapy as outlined in treatment plan below   Diet Recommendation NPO      OR       Puree/thin (currently) if  MD and mother agree with acceptance of aspiration risks    If pt to initiate diet recommend the following strategies  Multiple swallows with EACH bite/sip  Follow solids with liquids Chin tuck position Crush medicine given with applesauce - follow with water Rest breaks if with tachypnea or overtly coughing Sit upright for at least 30 minutes after meals      Other  Recommendations Oral Care Recommendations: Oral care QID   Follow Up Recommendations  Other (comment) (TBD) TBD   Frequency and Duration min 2x/week  2 weeks   Pertinent Vitals/Pain Low grade temp, lungs decreased- WEAK  nonproductive cough    SLP Swallow Goals Patient will utilize recommended strategies during swallow to  increase swallowing safety with: Total assistance Goal #3: Pt's mother/MD will determine if pt to consume po with  accepted asp risks and strategies in place to minimize aspiration  independently.    General Date of Onset: 03/15/12 HPI: 57 yo female h/o moderate mental retardation, autism,  seizure disorder 2 days ago at her group home choked while eating  some stringy cheese someone had to do the hemlick maneuver on her  which was successful.  She was brought to the ED and evaluated  and was going to be admitted but her mother signed her out AMA  back to the group home.  Pt then admitted  next date with sob and  coughing and fever diagnosed with pna.  Pt is now on 4 liters of  oxygen after requiring NR previously.  Pt does becomes tachypneic  with exertion.  H/O spasmodic torticollis present and pt receives  botox injections every 3 months. Type of Study: Modified Barium Swallowing Study Reason for Referral: Objectively evaluate swallowing function Previous Swallow Assessment: Mother reports patient has had a MBS  several years ago, but not in the [facility]. Diet Prior to this Study: NPO Respiratory Status: Supplemental O2 delivered via (comment) (NRB  mask 50%) History of Recent Intubation: No Behavior/Cognition: Alert;Distractible;Requires cueing;Doesn't  follow directions;Decreased sustained attention;Impulsive Oral Cavity - Dentition: Adequate natural dentition Oral Motor / Sensory Function: Impaired - see Bedside swallow  eval Self-Feeding Abilities: Able to feed self;Needs assist Patient Positioning: Upright in chair Baseline Vocal Quality: Low vocal intensity Volitional Cough: Cognitively unable to elicit Volitional Swallow: Unable to elicit Anatomy: Other (Comment) (difficult to view  due to motion  degradation)    Reason for Referral Objectively evaluate swallowing function   Oral Phase Oral Preparation/Oral Phase Oral Phase: Impaired Oral - Nectar Oral - Nectar Teaspoon: Piecemeal swallowing;Weak lingual  manipulation;Reduced posterior propulsion Oral - Nectar Cup: Piecemeal swallowing;Weak lingual  manipulation;Reduced posterior propulsion Oral - Thin Oral - Thin Teaspoon: Piecemeal swallowing;Weak lingual  manipulation;Reduced posterior propulsion;Lingual/palatal residue Oral - Thin Cup: Piecemeal swallowing;Weak lingual  manipulation;Reduced posterior propulsion;Lingual/palatal residue Oral - Thin Straw: Piecemeal swallowing;Reduced posterior  propulsion;Lingual/palatal residue Oral - Solids Oral - Puree: Piecemeal swallowing;Weak lingual  manipulation;Reduced posterior propulsion Oral  - Regular: Piecemeal swallowing;Weak lingual  manipulation;Reduced posterior propulsion;Impaired mastication Oral Phase - Comment Oral Phase - Comment: pt essentially allows boluses to spill into  pharynx uncontrolled due to lingual weakness/discoordination   Pharyngeal Phase Pharyngeal Phase Pharyngeal Phase: Impaired Pharyngeal - Nectar Pharyngeal - Nectar Teaspoon: Delayed swallow  initiation;Pharyngeal residue - pyriform sinuses;Lateral channel  residue;Premature spillage to valleculae;Reduced tongue base  retraction Pharyngeal - Nectar Cup: Lateral channel residue;Premature  spillage to valleculae;Reduced tongue base retraction Pharyngeal - Thin Pharyngeal - Thin Teaspoon: Lateral channel residue;Pharyngeal  residue - pyriform sinuses;Pharyngeal residue -  valleculae;Premature spillage to valleculae;Reduced tongue base  retraction Pharyngeal - Thin Cup: Pharyngeal residue - pyriform  sinuses;Lateral channel residue Pharyngeal - Thin Straw: Pharyngeal residue - pyriform  sinuses;Lateral channel residue Pharyngeal - Solids Pharyngeal - Puree: Delayed swallow initiation;Premature spillage  to valleculae;Pharyngeal residue - valleculae;Pharyngeal residue  - posterior pharnyx;Pharyngeal residue - pyriform sinuses Pharyngeal - Regular: Premature spillage to valleculae;Delayed  swallow initiation;Pharyngeal residue - valleculae Pharyngeal Phase - Comment Pharyngeal Comment: dry swallows decrease residuals but do not  eliminate them, pt's mother states pt was not cooperative  Cervical Esophageal Phase    GO    Cervical Esophageal Phase Cervical Esophageal Phase: Impaired Cervical Esophageal Phase - Nectar Nectar Teaspoon: Reduced cricopharyngeal relaxation Nectar Cup: Reduced cricopharyngeal relaxation Cervical Esophageal Phase - Thin Thin Teaspoon: Reduced cricopharyngeal relaxation Thin Cup: Reduced cricopharyngeal relaxation Thin Straw: Reduced cricopharyngeal relaxation Cervical Esophageal Phase - Solids Puree:  Reduced cricopharyngeal relaxation Regular: Reduced cricopharyngeal relaxation Cervical Esophageal Phase - Comment Cervical Esophageal Comment: 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, appearance of intermittent stasis at distal  esophagus with retrograde propulsion without pt  awarness/sensation         Donavan Burnet, MS [facility] SLP [phone]           Subjective: Patient denies fevers, chills, chest pain, shortness breath, nausea, vomiting, diarrhea. She has intermittent abdominal pain. Denies dysuria or hematuria. No rashes. No dizziness. No headache.  Objective: Filed Vitals:   04/02/12 2123 04/03/12 0643 04/03/12 0825 04/03/12 1434  BP: 134/94 127/77 135/91 128/80  Pulse: 111 101 97 106  Temp: 98 F (36.7 C) 97.9 F (36.6 C) 99.7 F (37.6 C) 97.7 F (36.5 C)  TempSrc: Oral Oral Oral Oral  Resp: 18 18 22 18   Height:      Weight:      SpO2: 93% 92% 93% 96%    Intake/Output Summary (Last 24 hours) at 04/03/12 1558 Last data filed at 04/03/12 1500  Gross per 24 hour  Intake   2675 ml  Output   1976 ml  Net    699 ml   Weight change:  Exam:   General:  Pt is alert, follows commands appropriately, not in acute distress  HEENT: No icterus, No thrush,[place]/AT  Cardiovascular: RRR, S1/S2, no rubs, no gallops  Respiratory: Poor inspiratory effort. Scattered crackles bilaterally. No wheezes  or rhonchi. Good air movement.  Abdomen: Soft/+BS, mild periumbilical tenderness without any guarding or peritoneal signs, non distended, no guarding  Extremities: trace edema, No lymphangitis, No petechiae, No rashes, no synovitis  Data Reviewed: Basic Metabolic Panel:  Lab 04/03/12 1610 04/02/12 0456 04/01/12 0544 03/30/12 0127 03/29/12 0346 03/28/12 0354  NA 138 143 143 140 136 --  K 3.7 3.4* 2.9* 3.7 3.2* --  CL 106 105 106 108 102 --  CO2 24 28 27 21 24  --  GLUCOSE 101* 97 101* 87 115* --  BUN 4* 5* 5* 8 11 --  CREATININE 0.91 1.00 0.96 0.98 1.00 --  CALCIUM 7.9*  8.3* 8.2* 8.7 8.6 --  MG -- 1.8 1.3* -- -- 1.3*  PHOS -- -- -- -- -- --   Liver Function Tests:  Lab 04/03/12 0540  AST 17  ALT 8  ALKPHOS 83  BILITOT 0.2*  PROT 6.1  ALBUMIN 2.0*   No results found for this basename: LIPASE:5,AMYLASE:5 in the last 168 hours No results found for this basename: AMMONIA:5 in the last 168 hours CBC:  Lab 04/03/12 0540 04/02/12 0456 04/01/12 0544 03/28/12 0354  WBC 21.7* 22.3* 19.5* 5.7  NEUTROABS 15.8* 16.1* 14.1* --  HGB 9.0* 9.9* 9.7* 10.2*  HCT 27.4* 29.8* 28.7* 30.1*  MCV 87.5 87.6 86.7 86.2  PLT 297 230 187 169   Cardiac Enzymes: No results found for this basename: CKTOTAL:5,CKMB:5,CKMBINDEX:5,TROPONINI:5 in the last 168 hours BNP: No components found with this basename: POCBNP:5 CBG: No results found for this basename: GLUCAP:5 in the last 168 hours  Recent Results (from the past 240 hour(s))  CULTURE, BLOOD (ROUTINE X 2)     Status: Normal   Collection Time   03/26/12 12:55 AM      Component Value Range Status Comment   Specimen Description BLOOD HAC   Final    Special Requests NONE BOTTLES DRAWN AEROBIC AND ANAEROBIC 5CC   Final    Culture  Setup Time 03/26/2012 11:07   Final    Culture NO GROWTH 5 DAYS   Final    Report Status 04/01/2012 FINAL   Final   URINE CULTURE     Status: Normal   Collection Time   03/26/12  1:26 AM      Component Value Range Status Comment   Specimen Description URINE, CATHETERIZED   Final    Special Requests NONE   Final    Culture  Setup Time 03/26/2012 14:22   Final    Colony Count NO GROWTH   Final    Culture NO GROWTH   Final    Report Status 03/27/2012 FINAL   Final   MRSA PCR SCREENING     Status: Normal   Collection Time   03/26/12  4:08 AM      Component Value Range Status Comment   MRSA by PCR NEGATIVE  NEGATIVE Final   CULTURE, BLOOD (ROUTINE X 2)     Status: Normal   Collection Time   03/26/12  5:30 AM      Component Value Range Status Comment   Specimen Description BLOOD LEFT HAND    Final    Special Requests BOTTLES DRAWN AEROBIC ONLY 1CC   Final    Culture  Setup Time 03/26/2012 11:07   Final    Culture NO GROWTH 5 DAYS   Final    Report Status 04/01/2012 FINAL   Final   CULTURE, EXPECTORATED SPUTUM-ASSESSMENT     Status: Normal   Collection  Time   03/26/12  7:51 AM      Component Value Range Status Comment   Specimen Description SPUTUM   Final    Special Requests Normal   Final    Sputum evaluation     Final    Value: THIS SPECIMEN IS ACCEPTABLE. RESPIRATORY CULTURE REPORT TO FOLLOW.   Report Status 03/26/2012 FINAL   Final   CULTURE, RESPIRATORY     Status: Normal   Collection Time   03/26/12  7:51 AM      Component Value Range Status Comment   Specimen Description SPUTUM   Final    Special Requests NONE   Final    Gram Stain     Final    Value: ABUNDANT WBC PRESENT,BOTH PMN AND MONONUCLEAR     FEW SQUAMOUS EPITHELIAL CELLS PRESENT     RARE GRAM POSITIVE COCCI     IN PAIRS RARE GRAM POSITIVE RODS     RARE GRAM NEGATIVE RODS   Culture NORMAL OROPHARYNGEAL FLORA   Final    Report Status 03/28/2012 FINAL   Final   CULTURE, BLOOD (ROUTINE X 2)     Status: Normal (Preliminary result)   Collection Time   04/01/12 11:10 AM      Component Value Range Status Comment   Specimen Description BLOOD RIGHT HAND   Final    Special Requests BOTTLES DRAWN AEROBIC AND ANAEROBIC 6CC   Final    Culture  Setup Time 04/01/2012 15:03   Final    Culture     Final    Value:        BLOOD CULTURE RECEIVED NO GROWTH TO DATE CULTURE WILL BE HELD FOR 5 DAYS BEFORE ISSUING A FINAL NEGATIVE REPORT   Report Status PENDING   Incomplete   CULTURE, BLOOD (ROUTINE X 2)     Status: Normal (Preliminary result)   Collection Time   04/01/12 11:20 AM      Component Value Range Status Comment   Specimen Description BLOOD LEFT HAND   Final    Special Requests BOTTLES DRAWN AEROBIC AND ANAEROBIC [facility]   Final    Culture  Setup Time 04/01/2012 15:03   Final    Culture     Final    Value:         BLOOD CULTURE RECEIVED NO GROWTH TO DATE CULTURE WILL BE HELD FOR 5 DAYS BEFORE ISSUING A FINAL NEGATIVE REPORT   Report Status PENDING   Incomplete   URINE CULTURE     Status: Normal   Collection Time   04/01/12  3:29 PM      Component Value Range Status Comment   Specimen Description URINE, CLEAN CATCH   Final    Special Requests NONE   Final    Culture  Setup Time 04/02/2012 02:17   Final    Colony Count 4,000 COLONIES/ML   Final    Culture INSIGNIFICANT GROWTH   Final    Report Status 04/03/2012 FINAL   Final      Scheduled Meds:   . antiseptic oral rinse  15 mL Mouth Rinse BID  . aspirin  81 mg Oral Daily  . clomiPRAMINE  100 mg Oral QHS  . clonazePAM  0.5 mg Oral TID  . clonazePAM  1 mg Oral Daily  . enoxaparin (LOVENOX) injection  40 mg Subcutaneous Q24H  . pantoprazole sodium  40 mg Per Tube Daily  . ranitidine  150 mg Oral QHS  . sertraline  200 mg Oral  Daily  . valproate sodium  500 mg Intravenous Q12H   Continuous Infusions:   . dextrose 5 % and 0.9% NaCl 1,000 mL with potassium chloride 20 mEq infusion 150 mL/hr at 04/03/12 1557     Daija Routson, DO  [facility] Pager [phone]  If 7PM-7AM, please contact night-coverage [email or web address] Password [facility] 04/03/2012, 3:58 PM   LOS: 8 days

## 2012-04-04 LAB — CBC WITH DIFFERENTIAL/PLATELET
Basophils Absolute: 0.2 10*3/uL — ABNORMAL HIGH (ref 0.0–0.1)
Basophils Relative: 1 % (ref 0–1)
Eosinophils Absolute: 0.8 10*3/uL — ABNORMAL HIGH (ref 0.0–0.7)
Hemoglobin: 8.9 g/dL — ABNORMAL LOW (ref 12.0–15.0)
Lymphocytes Relative: 16 % (ref 12–46)
MCHC: 33.1 g/dL (ref 30.0–36.0)
Monocytes Absolute: 1.6 10*3/uL — ABNORMAL HIGH (ref 0.1–1.0)
Neutrophils Relative %: 71 % (ref 43–77)
Platelets: 339 10*3/uL (ref 150–400)
Smear Review: ADEQUATE

## 2012-04-04 LAB — BASIC METABOLIC PANEL
Calcium: 8.2 mg/dL — ABNORMAL LOW (ref 8.4–10.5)
Creatinine, Ser: 0.85 mg/dL (ref 0.50–1.10)
GFR calc Af Amer: 87 mL/min — ABNORMAL LOW (ref >90)
GFR calc non Af Amer: 75 mL/min — ABNORMAL LOW (ref >90)
Sodium: 140 mEq/L (ref 135–145)

## 2012-04-04 LAB — MAGNESIUM: Magnesium: 1.5 mg/dL (ref 1.5–2.5)

## 2012-04-04 MED ORDER — ENSURE PUDDING PO PUDG: 1.0000 | Freq: Three times a day (TID) | ORAL | Status: DC

## 2012-04-04 NOTE — Progress Notes (Signed)
CSW was contacted concerning Pt d/c'ing back to group home ([facility] today.   CSW completed FL2 for d/c.   MD contacted by nurse for signature.   Facility to aid with transport Pt back to facility.   No further assistance needed at this time.   Contact CSW for any additional assistance.   CSW signing off.   Leron Croak, LCSWA [employer] [phone]

## 2012-04-04 NOTE — Progress Notes (Signed)
Patient and caregiver refusing Bed alarm. Educated patient and caregiver on Fall Risk and Fall Prevention Plan. Both patient and caregiver continue to refuse. Will continue to encourage and monitor.  Earnest Conroy. Clelia Croft, RN

## 2012-04-04 NOTE — Discharge Summary (Addendum)
Physician Discharge Summary  Sydney Hatfield ZOX:[id number] DOB: [birth year] DOA: 03/26/2012  PCP: Mickie Hillier, MD  Admit date: 03/26/2012 Discharge date: 04/04/2012  Recommendations for Outpatient Follow-up:  1. Pt will need to follow up with PCP in 1 weeks post discharge 2. Please obtain BMP to evaluate electrolytes and kidney function 3. Please also check CBC to evaluate Hg and Hct levels  Discharge Diagnoses:  Sepsis with acute respiratory failure likely secondary to aspiration pneumonia  -Patient admitted to step down and started on IV vancomycin , zosyn and Levaquin D#7--plan to discontinue all IV antibiotics today (04/02/2012) as patient has completed 7 days of intravenous antibiotics  -repeat CXR PA lateral shows bibasilar opacities (03/26/12). Patient febrile, tachycardic and tachypnic on 1/17. Was hypotensive and tachypnic overnight on day 2 requiring 500 ccx 2 IV NS bolus on 1/17. ABG was stable.  -afebrile past 72 hrs. RR and sats normal  -1/17/14Blood cx negative. Rapid flu negative  -antitussive for cough  -Blood cultures remained negative Leukocytosis  -On the day of discharge, WBC 20,400-workup has been nondiagnostic including CT abdomen and pelvis showed on 04/03/2012--patient remains afebrile, hemodynamically stable, clinically stable without worsening shortness of breath or hypoxemia -Suspect patient's recurrent aspiration as the cause--no dyspnea, respiratory distress, fevers. Mother understands the risks of recurrent aspiration and worsening pulmonary infection after another long discussion. -Patient will need close followup with her primary care provider -slight improvement today to 21.7, remains afebrile  -WBC increased to 19,500--04/01/12, 22.3 on 04/02/2012  -repeat UA with urine culture--does not suggest UTI  -repeat Blood cultures x2 sets 04/01/2012--negative to date  -Stool for C. difficile PCR--negative -Procalcitonin less than 0.10  -abx d/ced on 04/02/12  despite leukocytosis and patient was observed off antibiotics for signs of SIRS/Sepsis--remained stable clinically off abx until day of discharge -mother desires CT abdomen and pelvis-no cholecystitis, moderate stool, normal appearing bowel Elevated d-dimer  -Ordered on 04/01/2012 as part of workup for the patient's intermittent shortness of breath and tachycardia -Patient complains of intermittent shortness of breath  -CT angiogram chest-negative for pulmonary embolus, shows bilateral patchy air space infiltrates, most severe right upper lobe  -Venous duplex lower extremities-- negative  Dysphagia with risk for aspiration  -seen by speech therapy and high risk for aspiration. Recommended NPO.  -spoken with her neurologist Dr. Herminio Heads at [facility] on 1/17. 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 Her neurologist does not agree that aspiration is caused by effect of the botulinum toxin. Patient has received her third dose of Botulinum toxin this year and her neurologist feels the risk of aspiration with botulinum toxin is not common.  -Continue head of bed elevation.  -Counseled patient on eating slowly and quiet environment  -Patient failed MBS with high aspiration risk 03/29/2012 . Mother wants patient to eat Despite high aspiration risk. ( unable to decide on alternate means of feeding) Kept on pureed diet suggested by Speech therapy.  Discussed risk of recurrent aspiration and worsening pulmonary infection. Mother expresses understanding of the risks involved and wishes to continue giving oral feedings. -Although high risk of aspiration remains, mother does not wish for gastrostomy tube at this time--risks, benefits, and alternatives were discussed with the patient's mother History of seizure disorder  -Patient did not have any seizures throughout her hospitalization Depakote level is therapeutic. Continue current  dose of Depakote.  History of mental retardation with agitation.  Continue her home dose of Klonopin. Continue Zoloft  -  Restart clomipramine  Chronic torticollis  Patient has been taking botulinum toxin toxin injection every 3 months for past 9 months and follows with Dr. Herminio Heads at [facility] Last injection received 1 months back. Discussed plan with Dr Marya Fossa.  Iron Deficiency anemia  Continue iron supplements   Discharge Condition: Stable  Disposition:  back to the group home  Diet: Dysphagia 1 diet Wt Readings from Last 3 Encounters:  03/29/12 64.4 kg (141 lb 15.6 oz)    History of present illness:  57 yo female h/o moderate mental retardation, autism, seizure disorder 2 days ago at her group home choked while eating some stringy cheese.  someone had to do the heimlick maneuver on her which was successful. She was brought to the ED and evaluated and was going to be admitted but her mother signed her out AMA back to the group home. Mom is present right now and says she was doing well yesterday and then today the staff found her very sob and coughing a lot and she started running fever today. There has been no report of n/v/d. She did f/u with her pcp yesterday for the ED visit and was started on levaquin yesterday. She has not been complaining of anything and required a NRB mask in ED. She denies any pain. Mom at bedside and is appropriate. On arrival sbp 80's oxygen sats also in 80's on RA, tachypneic and febrile.     Discharge Exam: Filed Vitals:   04/03/12 2105  BP: 152/81  Pulse: 106  Temp: 98 F (36.7 C)  Resp: 18   Filed Vitals:   04/03/12 0643 04/03/12 0825 04/03/12 1434 04/03/12 2105  BP: 127/77 135/91 128/80 152/81  Pulse: 101 97 106 106  Temp: 97.9 F (36.6 C) 99.7 F (37.6 C) 97.7 F (36.5 C) 98 F (36.7 C)  TempSrc: Oral Oral Oral Oral  Resp: 18 22 18 18   Height:      Weight:      SpO2: 92% 93% 96% 97%   General: A&O x 3, NAD, pleasant,  cooperative Cardiovascular: RRR, no rub, no gallop, no S3 Respiratory: Poor inspiratory effort. Bibasilar rales. No wheezing or rhonchi. Abdomen:soft, nontender, nondistended, positive bowel sounds Extremities: trace edema, No lymphangitis, no petechiae  Discharge Instructions     Medication List     As of 04/04/2012 10:54 AM    ASK your doctor about these medications         aspirin EC 81 MG tablet   Take 81 mg by mouth daily.      clomiPRAMINE 50 MG capsule   Commonly known as: ANAFRANIL   Take 100 mg by mouth at bedtime.      clonazePAM 0.5 MG tablet   Commonly known as: KLONOPIN   Take 0.5-1 mg by mouth 2 (two) times daily as needed. For anxiety      clotrimazole-betamethasone cream   Commonly known as: LOTRISONE   Apply 1 application topically 2 (two) times daily. Apply to face      divalproex 500 MG 24 hr tablet   Commonly known as: DEPAKOTE ER   Take 1,000 mg by mouth at bedtime.      esomeprazole 40 MG capsule   Commonly known as: NEXIUM   Take 40 mg by mouth daily before breakfast.      ferrous sulfate 325 (65 FE) MG tablet   Take 650 mg by mouth daily with breakfast.      hydrocortisone 2.5 % cream   Apply  1 application topically 2 (two) times daily as needed.      nitrofurantoin 50 MG capsule   Commonly known as: MACRODANTIN   Take 50 mg by mouth every evening.      ranitidine 150 MG tablet   Commonly known as: ZANTAC   Take 150 mg by mouth every evening.      sertraline 100 MG tablet   Commonly known as: ZOLOFT   Take 200 mg by mouth daily.      simvastatin 40 MG tablet   Commonly known as: ZOCOR   Take 40 mg by mouth every evening.           The results of significant diagnostics from this hospitalization (including imaging, microbiology, ancillary and laboratory) are listed below for reference.    Significant Diagnostic Studies: Dg Chest 1 View  03/26/2012  *RADIOLOGY REPORT*  Clinical Data: Fever, respiratory distress.  CHEST - 1  VIEW  Comparison: 03/23/2012  Findings: Left retrocardiac linear scarring or atelectasis.  Low lung volumes with resultant crowding of bronchovascular structures. Suspect mild central pulmonary vascular congestion and some mild bibasilar interstitial edema or infiltrates.  No confluent airspace disease.  No effusion.  Heart size upper limits normal.  Regional bones unremarkable.  IMPRESSION:  Low volumes with suspicion of central pulmonary vascular congestion and mild bibasilar interstitial edema.   Original Report Authenticated By: D. Andria Rhein, MD    Dg Chest 2 View  03/26/2012  *RADIOLOGY REPORT*  Clinical Data: Evaluate for pneumonia.  CHEST - 2 VIEW  Comparison: Earlier today at both 51 hours.  Findings: Degraded lateral view secondary position.  Mildly motion degraded frontal view. Patient rotated right.  Borderline cardiomegaly, accentuated by AP portable technique.  Probable small bilateral pleural effusions on the lateral. No pneumothorax.  The chin overlies the apices.  Development of patchy bibasilar airspace disease.  IMPRESSION: Developing bibasilar airspace disease, which could represent atelectasis or infection.  Probable small bilateral pleural effusions.  Multifactorial degradation.   Original Report Authenticated By: Jeronimo Greaves, M.D.    Ct Head Wo Contrast  03/23/2012  *RADIOLOGY REPORT*  Clinical Data:  ALTERED MENTAL STATUS,neck pain, torticolis,  h/o pizza choking.  CT HEAD WITHOUT CONTRAST CT CERVICAL SPINE WITHOUT CONTRAST  Technique:  Multidetector CT imaging of the head and cervical spine was performed following the standard protocol without IV contrast. Multiplanar CT image reconstructions of the cervical spine were also generated.  Comparison: 09/13/2010  CT HEAD  Findings: Diffuse parenchymal atrophy. Patchy areas of hypoattenuation in deep and periventricular white matter bilaterally. Negative for acute intracranial hemorrhage, mass lesion, acute infarction, midline shift, or  mass-effect. Acute infarct may be inapparent on noncontrast CT. Ventricles and sulci symmetric. Bone windows demonstrate no focal lesion.  IMPRESSION:  1. Negative for bleed or other acute intracranial process.  2. Atrophy and nonspecific white matter changes  CT CERVICAL SPINE  Findings: Normal alignment.  Vertebral body and intervertebral disc height well maintained throughout.  Fusion across the left C3-4 facet.  No prevertebral soft tissue swelling.  Negative for fracture.  Bilateral partially calcified carotid bifurcation plaque.  IMPRESSION: 1.  Negative for fracture or other acute bony abnormality. 2.  Fusion across the left C3-4 facet.   Original Report Authenticated By: D. Andria Rhein, MD    Ct Angio Chest Pe W/cm &/or Wo Cm  04/01/2012  *RADIOLOGY REPORT*  Clinical Data: Shortness of breath, elevated D-dimer, question pulmonary embolism, known aspiration pneumonia  CT ANGIOGRAPHY CHEST  Technique:  Multidetector  CT imaging of the chest using the standard protocol during bolus administration of intravenous contrast. Multiplanar reconstructed images including MIPs were obtained and reviewed to evaluate the vascular anatomy.  Contrast: OMNIPAQUE IOHEXOL 350 MG/ML SOLN  Comparison: None  Findings: Aorta normal caliber without aneurysm or dissection. Atherosclerotic calcifications coronary arteries. Visualized portion of upper abdomen unremarkable. Respiratory motion artifacts degrade assessment of the lung bases. No definite pulmonary emboli identified. Small bilateral pleural effusions and lower lobe subsegmental atelectasis. Minimally enlarged precarinal lymph node. Patchy bilateral airspace infiltrates most severe in the right upper lobe. No pneumothorax or acute osseous findings.  IMPRESSION: No evidence of pulmonary embolism identified though assessment of the lower lobe pulmonary arterial branches is suboptimal due to respiratory motion artifacts. Patchy bilateral airspace infiltrates most  severe in the right upper lobe. Bibasilar pleural effusions and atelectasis. Single nonspecific minimally enlarged precarinal lymph node.   Original Report Authenticated By: Ulyses Southward, M.D.    Ct Cervical Spine Wo Contrast  03/23/2012  *RADIOLOGY REPORT*  Clinical Data:  ALTERED MENTAL STATUS,neck pain, torticolis,  h/o pizza choking.  CT HEAD WITHOUT CONTRAST CT CERVICAL SPINE WITHOUT CONTRAST  Technique:  Multidetector CT imaging of the head and cervical spine was performed following the standard protocol without IV contrast. Multiplanar CT image reconstructions of the cervical spine were also generated.  Comparison: 09/13/2010  CT HEAD  Findings: Diffuse parenchymal atrophy. Patchy areas of hypoattenuation in deep and periventricular white matter bilaterally. Negative for acute intracranial hemorrhage, mass lesion, acute infarction, midline shift, or mass-effect. Acute infarct may be inapparent on noncontrast CT. Ventricles and sulci symmetric. Bone windows demonstrate no focal lesion.  IMPRESSION:  1. Negative for bleed or other acute intracranial process.  2. Atrophy and nonspecific white matter changes  CT CERVICAL SPINE  Findings: Normal alignment.  Vertebral body and intervertebral disc height well maintained throughout.  Fusion across the left C3-4 facet.  No prevertebral soft tissue swelling.  Negative for fracture.  Bilateral partially calcified carotid bifurcation plaque.  IMPRESSION: 1.  Negative for fracture or other acute bony abnormality. 2.  Fusion across the left C3-4 facet.   Original Report Authenticated By: D. Andria Rhein, MD    Ct Abdomen Pelvis W Contrast  04/03/2012  *RADIOLOGY REPORT*  Clinical Data: Fever.  Leukocytosis with white blood count 22,300. Patient currently anticoagulated.  Diffuse abdominal pain.  Current history of autism and mental retardation.  CT ABDOMEN AND PELVIS WITH CONTRAST  Technique:  Multidetector CT imaging of the abdomen and pelvis was performed following  the standard protocol during bolus administration of intravenous contrast.  Contrast: OMNIPAQUE IOHEXOL 300 MG/ML. Oral contrast was also administered.  Comparison: CT abdomen and pelvis 01/22/2010.  Abdominal ultrasound 01/23/2010.  Findings: Respiratory motion blurred many of the images. Previously identified gallstones not visible within the gallbladder; moderate gallbladder distention.  No biliary ductal dilation.  Normal appearing liver, spleen, pancreas, adrenal glands, and kidneys.  Moderate aorto-iliac atherosclerosis without aneurysm.  No significant lymphadenopathy.  Stomach decompressed and unremarkable.  Normal-appearing small bowel.  Moderate stool burden in a normal appearing colon.  Cecum extends low in the pelvis.  Appendix not clearly visualized, but no pericecal inflammation.  No ascites.  Uterus atrophic consistent with age.  No adnexal masses or free pelvic fluid.  Urinary bladder decompressed and unremarkable. Phleboliths in the pelvis.  Bone window images unremarkable.  Airspace consolidation with air bronchograms in the lower lobes, left greater than right, the lingula, and patchy airspace opacities in the  visualized right middle lobe.  Small bilateral pleural effusions.  IMPRESSION:  1.  Distended gallbladder.  No CT evidence for acute cholecystitis. Previously identified gallstone is not visible on CT. 2.  Pneumonia involving the lower lobes bilaterally, the lingula, and the right middle lobe.  Small bilateral pleural effusions.   Original Report Authenticated By: Hulan Saas, M.D.    Dg Chest Portable 1 View  03/22/2012  *RADIOLOGY REPORT*  Clinical Data: Altered mental status.  PORTABLE CHEST - 1 VIEW  Comparison: 03/04/2012  Findings: Coarse bibasilar interstitial markings, probably emphasized by low lung volumes.  No confluent airspace infiltrate. No effusion.  Heart size upper limits normal.  Regional bones unremarkable.  IMPRESSION:  1.  Low volumes.  No definite acute  disease.   Original Report Authenticated By: D. Andria Rhein, MD    Dg Swallowing [facility] Pathology  03/29/2012  Chales Abrahams, CCC-SLP     03/29/2012 11:38 AM Objective Swallowing Evaluation: Modified Barium Swallowing Study   Patient Details  Name: ROCHELL PUETT MRN: [id number] Date of Birth: 02/22/1956  Today's Date: 03/29/2012 Time: 1005-1055 SLP Time Calculation (min): 50 min  Past Medical History:  Past Medical History  Diagnosis Date  . Autistic spectrum disorder   . Anxiety disorder   . Seizure   . MR (mental retardation), moderate   . Torticollis    Past Surgical History:  Past Surgical History  Procedure Date  . Colonoscopy   . Breast biopsy    HPI:  57 yo female h/o moderate mental retardation, autism, seizure  disorder 2 days ago at her group home choked while eating some  stringy cheese someone had to do the hemlick maneuver on her  which was successful.  She was brought to the ED and evaluated  and was going to be admitted but her mother signed her out AMA  back to the group home.  Pt then admitted next date with sob and  coughing and fever diagnosed with pna.  Pt is now on 4 liters of  oxygen after requiring NR previously.  Pt does becomes tachypneic  with exertion.  H/O spasmodic torticollis present and pt receives  botox injections every 3 months.     Assessment / Plan / Recommendation Clinical Impression  Dysphagia Diagnosis: Moderate oral phase dysphagia;Moderate  pharyngeal phase dysphagia;Moderate cervical esophageal phase  dysphagia  Clinical impression: Moderate oropharyngeal and suspected  cervical esophageal dysphagia present suspected due to  torticollis, MR and deconditioning.    Dysphagia characterized by weakness/decr oral control resulting  in premature spillage of barium into pharynx and decr tongue base  retraction resulting in pharyngeal stasis whic pt does not sense.   Dry swallows help to decrease pharyngeal residuals but do not  clear them and pt did not follow  directions to conduct further  dry swallows.  Appearance of decreased UES opening, ? 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   Fortunately pt has large pyriform sinuses that gathers barium.   Please note, pt coughed throughout entire MBS and barium was not  seen in larynx or trachea.  Her aspiration risk remains high due  to her sensoriomotor dysphagia, decreased ability to follow  directions to dry swallow to clear residuals and deconditioning.      Based on report from her mother that the pt will cough on  occasion with intake and h/o MBS completion years prior, suspect  chronic dysphagia that pt typically manages.  Mother states she  would like pt  to consume intake with KNOWN risks (recurrent  pulmonary infections, aspiration) and strategies to maximiize  airway protection with MD permission.  Pt is particular about  what she eats but eating gives her QOL per mother.  Educated  mother to importance of monitoring po tolerance, dry swallows to  decr residuals, assuring pt not tachypenic, etc with intake.    Please note:  Pt's head was in chin tuck position as she did not  hold it up even with max cues.  May be beneficial for airway  protection.  Unable to assess swallow in head neutral, which is  position mother states is pt's normal position.   Will inform MD to results and mother's desire and will follow.      Treatment Recommendation  Therapy as outlined in treatment plan below   Diet Recommendation NPO      OR       Puree/thin (currently) if  MD and mother agree with acceptance of aspiration risks    If pt to initiate diet recommend the following strategies  Multiple swallows with EACH bite/sip  Follow solids with liquids Chin tuck position Crush medicine given with applesauce - follow with water Rest breaks if with tachypnea or overtly coughing Sit upright for at least 30 minutes after meals      Other  Recommendations Oral Care Recommendations: Oral care QID   Follow Up  Recommendations  Other (comment) (TBD) TBD   Frequency and Duration min 2x/week  2 weeks   Pertinent Vitals/Pain Low grade temp, lungs decreased- WEAK  nonproductive cough    SLP Swallow Goals Patient will utilize recommended strategies during swallow to  increase swallowing safety with: Total assistance Goal #3: Pt's mother/MD will determine if pt to consume po with  accepted asp risks and strategies in place to minimize aspiration  independently.    General Date of Onset: 03/15/12 HPI: 57 yo female h/o moderate mental retardation, autism,  seizure disorder 2 days ago at her group home choked while eating  some stringy cheese someone had to do the hemlick maneuver on her  which was successful.  She was brought to the ED and evaluated  and was going to be admitted but her mother signed her out AMA  back to the group home.  Pt then admitted next date with sob and  coughing and fever diagnosed with pna.  Pt is now on 4 liters of  oxygen after requiring NR previously.  Pt does becomes tachypneic  with exertion.  H/O spasmodic torticollis present and pt receives  botox injections every 3 months. Type of Study: Modified Barium Swallowing Study Reason for Referral: Objectively evaluate swallowing function Previous Swallow Assessment: Mother reports patient has had a MBS  several years ago, but not in the [facility]. Diet Prior to this Study: NPO Respiratory Status: Supplemental O2 delivered via (comment) (NRB  mask 50%) History of Recent Intubation: No Behavior/Cognition: Alert;Distractible;Requires cueing;Doesn't  follow directions;Decreased sustained attention;Impulsive Oral Cavity - Dentition: Adequate natural dentition Oral Motor / Sensory Function: Impaired - see Bedside swallow  eval Self-Feeding Abilities: Able to feed self;Needs assist Patient Positioning: Upright in chair Baseline Vocal Quality: Low vocal intensity Volitional Cough: Cognitively unable to elicit Volitional Swallow: Unable to elicit Anatomy: Other  (Comment) (difficult to view due to motion  degradation)    Reason for Referral Objectively evaluate swallowing function   Oral Phase Oral Preparation/Oral Phase Oral Phase: Impaired Oral - Nectar Oral - Nectar Teaspoon: Piecemeal swallowing;Weak lingual  manipulation;Reduced posterior  propulsion Oral - Nectar Cup: Piecemeal swallowing;Weak lingual  manipulation;Reduced posterior propulsion Oral - Thin Oral - Thin Teaspoon: Piecemeal swallowing;Weak lingual  manipulation;Reduced posterior propulsion;Lingual/palatal residue Oral - Thin Cup: Piecemeal swallowing;Weak lingual  manipulation;Reduced posterior propulsion;Lingual/palatal residue Oral - Thin Straw: Piecemeal swallowing;Reduced posterior  propulsion;Lingual/palatal residue Oral - Solids Oral - Puree: Piecemeal swallowing;Weak lingual  manipulation;Reduced posterior propulsion Oral - Regular: Piecemeal swallowing;Weak lingual  manipulation;Reduced posterior propulsion;Impaired mastication Oral Phase - Comment Oral Phase - Comment: pt essentially allows boluses to spill into  pharynx uncontrolled due to lingual weakness/discoordination   Pharyngeal Phase Pharyngeal Phase Pharyngeal Phase: Impaired Pharyngeal - Nectar Pharyngeal - Nectar Teaspoon: Delayed swallow  initiation;Pharyngeal residue - pyriform sinuses;Lateral channel  residue;Premature spillage to valleculae;Reduced tongue base  retraction Pharyngeal - Nectar Cup: Lateral channel residue;Premature  spillage to valleculae;Reduced tongue base retraction Pharyngeal - Thin Pharyngeal - Thin Teaspoon: Lateral channel residue;Pharyngeal  residue - pyriform sinuses;Pharyngeal residue -  valleculae;Premature spillage to valleculae;Reduced tongue base  retraction Pharyngeal - Thin Cup: Pharyngeal residue - pyriform  sinuses;Lateral channel residue Pharyngeal - Thin Straw: Pharyngeal residue - pyriform  sinuses;Lateral channel residue Pharyngeal - Solids Pharyngeal - Puree: Delayed swallow initiation;Premature  spillage  to valleculae;Pharyngeal residue - valleculae;Pharyngeal residue  - posterior pharnyx;Pharyngeal residue - pyriform sinuses Pharyngeal - Regular: Premature spillage to valleculae;Delayed  swallow initiation;Pharyngeal residue - valleculae Pharyngeal Phase - Comment Pharyngeal Comment: dry swallows decrease residuals but do not  eliminate them, pt's mother states pt was not cooperative  Cervical Esophageal Phase    GO    Cervical Esophageal Phase Cervical Esophageal Phase: Impaired Cervical Esophageal Phase - Nectar Nectar Teaspoon: Reduced cricopharyngeal relaxation Nectar Cup: Reduced cricopharyngeal relaxation Cervical Esophageal Phase - Thin Thin Teaspoon: Reduced cricopharyngeal relaxation Thin Cup: Reduced cricopharyngeal relaxation Thin Straw: Reduced cricopharyngeal relaxation Cervical Esophageal Phase - Solids Puree: Reduced cricopharyngeal relaxation Regular: Reduced cricopharyngeal relaxation Cervical Esophageal Phase - Comment Cervical Esophageal Comment: 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, appearance of intermittent stasis at distal  esophagus with retrograde propulsion without pt  awarness/sensation         Donavan Burnet, MS [facility] SLP [phone]       Microbiology: Recent Results (from the past 240 hour(s))  CULTURE, BLOOD (ROUTINE X 2)     Status: Normal   Collection Time   03/26/12 12:55 AM      Component Value Range Status Comment   Specimen Description BLOOD HAC   Final    Special Requests NONE BOTTLES DRAWN AEROBIC AND ANAEROBIC 5CC   Final    Culture  Setup Time 03/26/2012 11:07   Final    Culture NO GROWTH 5 DAYS   Final    Report Status 04/01/2012 FINAL   Final   URINE CULTURE     Status: Normal   Collection Time   03/26/12  1:26 AM      Component Value Range Status Comment   Specimen Description URINE, CATHETERIZED   Final    Special Requests NONE   Final    Culture  Setup Time 03/26/2012 14:22   Final    Colony Count NO GROWTH   Final    Culture NO  GROWTH   Final    Report Status 03/27/2012 FINAL   Final   MRSA PCR SCREENING     Status: Normal   Collection Time   03/26/12  4:08 AM      Component Value Range Status Comment   MRSA by PCR NEGATIVE  NEGATIVE Final   CULTURE,  BLOOD (ROUTINE X 2)     Status: Normal   Collection Time   03/26/12  5:30 AM      Component Value Range Status Comment   Specimen Description BLOOD LEFT HAND   Final    Special Requests BOTTLES DRAWN AEROBIC ONLY 1CC   Final    Culture  Setup Time 03/26/2012 11:07   Final    Culture NO GROWTH 5 DAYS   Final    Report Status 04/01/2012 FINAL   Final   CULTURE, EXPECTORATED SPUTUM-ASSESSMENT     Status: Normal   Collection Time   03/26/12  7:51 AM      Component Value Range Status Comment   Specimen Description SPUTUM   Final    Special Requests Normal   Final    Sputum evaluation     Final    Value: THIS SPECIMEN IS ACCEPTABLE. RESPIRATORY CULTURE REPORT TO FOLLOW.   Report Status 03/26/2012 FINAL   Final   CULTURE, RESPIRATORY     Status: Normal   Collection Time   03/26/12  7:51 AM      Component Value Range Status Comment   Specimen Description SPUTUM   Final    Special Requests NONE   Final    Gram Stain     Final    Value: ABUNDANT WBC PRESENT,BOTH PMN AND MONONUCLEAR     FEW SQUAMOUS EPITHELIAL CELLS PRESENT     RARE GRAM POSITIVE COCCI     IN PAIRS RARE GRAM POSITIVE RODS     RARE GRAM NEGATIVE RODS   Culture NORMAL OROPHARYNGEAL FLORA   Final    Report Status 03/28/2012 FINAL   Final   CULTURE, BLOOD (ROUTINE X 2)     Status: Normal (Preliminary result)   Collection Time   04/01/12 11:10 AM      Component Value Range Status Comment   Specimen Description BLOOD RIGHT HAND   Final    Special Requests BOTTLES DRAWN AEROBIC AND ANAEROBIC 6CC   Final    Culture  Setup Time 04/01/2012 15:03   Final    Culture     Final    Value:        BLOOD CULTURE RECEIVED NO GROWTH TO DATE CULTURE WILL BE HELD FOR 5 DAYS BEFORE ISSUING A FINAL NEGATIVE REPORT    Report Status PENDING   Incomplete   CULTURE, BLOOD (ROUTINE X 2)     Status: Normal (Preliminary result)   Collection Time   04/01/12 11:20 AM      Component Value Range Status Comment   Specimen Description BLOOD LEFT HAND   Final    Special Requests BOTTLES DRAWN AEROBIC AND ANAEROBIC [facility]   Final    Culture  Setup Time 04/01/2012 15:03   Final    Culture     Final    Value:        BLOOD CULTURE RECEIVED NO GROWTH TO DATE CULTURE WILL BE HELD FOR 5 DAYS BEFORE ISSUING A FINAL NEGATIVE REPORT   Report Status PENDING   Incomplete   URINE CULTURE     Status: Normal   Collection Time   04/01/12  3:29 PM      Component Value Range Status Comment   Specimen Description URINE, CLEAN CATCH   Final    Special Requests NONE   Final    Culture  Setup Time 04/02/2012 02:17   Final    Colony Count 4,000 COLONIES/ML   Final    Culture INSIGNIFICANT  GROWTH   Final    Report Status 04/03/2012 FINAL   Final   CLOSTRIDIUM DIFFICILE BY PCR     Status: Normal   Collection Time   04/03/12 12:57 PM      Component Value Range Status Comment   C difficile by pcr NEGATIVE  NEGATIVE Final      Labs: Basic Metabolic Panel:  Lab 04/04/12 2725 04/03/12 0540 04/02/12 0456 04/01/12 0544 03/30/12 0127  NA 140 138 143 143 140  K 3.8 3.7 -- -- --  CL 107 106 105 106 108  CO2 23 24 28 27 21   GLUCOSE 94 101* 97 101* 87  BUN 4* 4* 5* 5* 8  CREATININE 0.85 0.91 1.00 0.96 0.98  CALCIUM 8.2* 7.9* 8.3* 8.2* 8.7  MG 1.5 -- 1.8 1.3* --  PHOS -- -- -- -- --   Liver Function Tests:  Lab 04/03/12 0540  AST 17  ALT 8  ALKPHOS 83  BILITOT 0.2*  PROT 6.1  ALBUMIN 2.0*   No results found for this basename: LIPASE:5,AMYLASE:5 in the last 168 hours No results found for this basename: AMMONIA:5 in the last 168 hours CBC:  Lab 04/04/12 0441 04/03/12 0540 04/02/12 0456 04/01/12 0544  WBC 20.4* 21.7* 22.3* 19.5*  NEUTROABS 14.5* 15.8* 16.1* 14.1*  HGB 8.9* 9.0* 9.9* 9.7*  HCT 26.9* 27.4* 29.8* 28.7*  MCV 88.5  87.5 87.6 86.7  PLT 339 297 230 187   Cardiac Enzymes: No results found for this basename: CKTOTAL:5,CKMB:5,CKMBINDEX:5,TROPONINI:5 in the last 168 hours BNP: No components found with this basename: POCBNP:5 CBG: No results found for this basename: GLUCAP:5 in the last 168 hours  Time coordinating discharge:  Greater than 30 minutes  Signed:  Shonette Rhames, DO [facility] Pager: [phone] 04/04/2012, 10:54 AM

## 2012-04-07 LAB — CULTURE, BLOOD (ROUTINE X 2)

## 2012-11-30 ENCOUNTER — Other Ambulatory Visit: Payer: Self-pay

## 2012-11-30 DIAGNOSIS — Z1231 Encounter for screening mammogram for malignant neoplasm of breast: Secondary | ICD-10-CM

## 2013-01-04 ENCOUNTER — Ambulatory Visit
Admission: RE | Admit: 2013-01-04 | Discharge: 2013-01-04 | Disposition: A | Discharge status: Home / Self Care | Payer: Medicare Other | Source: Ambulatory Visit

## 2013-01-04 DIAGNOSIS — Z1231 Encounter for screening mammogram for malignant neoplasm of breast: Secondary | ICD-10-CM

## 2013-11-24 ENCOUNTER — Encounter (HOSPITAL_COMMUNITY): Payer: Self-pay | Admitting: Emergency Medicine

## 2013-11-24 ENCOUNTER — Inpatient Hospital Stay (HOSPITAL_COMMUNITY)
Admission: EM | Admit: 2013-11-24 | Discharge: 2013-11-26 | DRG: 178 | Disposition: A | Discharge status: Home / Self Care | Payer: Medicare Other | Attending: Internal Medicine | Admitting: Internal Medicine

## 2013-11-24 ENCOUNTER — Emergency Department (HOSPITAL_COMMUNITY): Payer: Medicare Other

## 2013-11-24 DIAGNOSIS — J690 Pneumonitis due to inhalation of food and vomit: Secondary | ICD-10-CM | POA: Diagnosis not present

## 2013-11-24 DIAGNOSIS — G40909 Epilepsy, unspecified, not intractable, without status epilepticus: Secondary | ICD-10-CM | POA: Diagnosis present

## 2013-11-24 DIAGNOSIS — E876 Hypokalemia: Secondary | ICD-10-CM

## 2013-11-24 DIAGNOSIS — R7989 Other specified abnormal findings of blood chemistry: Secondary | ICD-10-CM

## 2013-11-24 DIAGNOSIS — F71 Moderate intellectual disabilities: Secondary | ICD-10-CM

## 2013-11-24 DIAGNOSIS — Z23 Encounter for immunization: Secondary | ICD-10-CM

## 2013-11-24 DIAGNOSIS — F79 Unspecified intellectual disabilities: Secondary | ICD-10-CM

## 2013-11-24 DIAGNOSIS — Z7982 Long term (current) use of aspirin: Secondary | ICD-10-CM

## 2013-11-24 DIAGNOSIS — R1310 Dysphagia, unspecified: Secondary | ICD-10-CM

## 2013-11-24 DIAGNOSIS — F419 Anxiety disorder, unspecified: Secondary | ICD-10-CM

## 2013-11-24 DIAGNOSIS — F840 Autistic disorder: Secondary | ICD-10-CM

## 2013-11-24 DIAGNOSIS — Z79899 Other long term (current) drug therapy: Secondary | ICD-10-CM

## 2013-11-24 DIAGNOSIS — D509 Iron deficiency anemia, unspecified: Secondary | ICD-10-CM | POA: Diagnosis present

## 2013-11-24 DIAGNOSIS — D72829 Elevated white blood cell count, unspecified: Secondary | ICD-10-CM

## 2013-11-24 DIAGNOSIS — T17908A Unspecified foreign body in respiratory tract, part unspecified causing other injury, initial encounter: Secondary | ICD-10-CM

## 2013-11-24 DIAGNOSIS — R0602 Shortness of breath: Secondary | ICD-10-CM | POA: Diagnosis not present

## 2013-11-24 DIAGNOSIS — M436 Torticollis: Secondary | ICD-10-CM | POA: Diagnosis present

## 2013-11-24 DIAGNOSIS — T17308A Unspecified foreign body in larynx causing other injury, initial encounter: Secondary | ICD-10-CM

## 2013-11-24 DIAGNOSIS — F411 Generalized anxiety disorder: Secondary | ICD-10-CM | POA: Diagnosis present

## 2013-11-24 DIAGNOSIS — R569 Unspecified convulsions: Secondary | ICD-10-CM

## 2013-11-24 HISTORY — DX: Pneumonitis due to inhalation of food and vomit: J69.0

## 2013-11-24 HISTORY — DX: Personal history of other diseases of the nervous system and sense organs: Z86.69

## 2013-11-24 LAB — BASIC METABOLIC PANEL
Anion gap: 16 — ABNORMAL HIGH (ref 5–15)
BUN: 18 mg/dL (ref 6–23)
CO2: 22 meq/L (ref 19–32)
Calcium: 9.6 mg/dL (ref 8.4–10.5)
Chloride: 100 mEq/L (ref 96–112)
Creatinine, Ser: 0.87 mg/dL (ref 0.50–1.10)
GFR calc Af Amer: 83 mL/min — ABNORMAL LOW (ref >90)
GFR calc non Af Amer: 72 mL/min — ABNORMAL LOW (ref >90)
Glucose, Bld: 159 mg/dL — ABNORMAL HIGH (ref 70–99)
Potassium: 4.1 mEq/L (ref 3.7–5.3)
Sodium: 138 mEq/L (ref 137–147)

## 2013-11-24 LAB — CBC WITH DIFFERENTIAL/PLATELET
Basophils Absolute: 0 10*3/uL (ref 0.0–0.1)
Basophils Relative: 0 % (ref 0–1)
Eosinophils Absolute: 0.2 10*3/uL (ref 0.0–0.7)
Eosinophils Relative: 1 % (ref 0–5)
HEMATOCRIT: 36.2 % (ref 36.0–46.0)
Hemoglobin: 11.7 g/dL — ABNORMAL LOW (ref 12.0–15.0)
LYMPHS PCT: 36 % (ref 12–46)
Lymphs Abs: 5.2 10*3/uL — ABNORMAL HIGH (ref 0.7–4.0)
MCH: 29.5 pg (ref 26.0–34.0)
MCHC: 32.3 g/dL (ref 30.0–36.0)
MCV: 91.4 fL (ref 78.0–100.0)
MONO ABS: 1.2 10*3/uL — AB (ref 0.1–1.0)
Monocytes Relative: 8 % (ref 3–12)
Neutro Abs: 7.9 10*3/uL — ABNORMAL HIGH (ref 1.7–7.7)
Neutrophils Relative %: 55 % (ref 43–77)
Platelets: 297 10*3/uL (ref 150–400)
RBC: 3.96 MIL/uL (ref 3.87–5.11)
RDW: 14.2 % (ref 11.5–15.5)
WBC: 14.5 10*3/uL — AB (ref 4.0–10.5)

## 2013-11-24 LAB — URINE MICROSCOPIC-ADD ON

## 2013-11-24 LAB — BLOOD GAS, ARTERIAL
Acid-base deficit: 4 mmol/L — ABNORMAL HIGH (ref 0.0–2.0)
BICARBONATE: 22.2 meq/L (ref 20.0–24.0)
Drawn by: 331471
O2 Content: 6 L/min
O2 Saturation: 94.6 %
PATIENT TEMPERATURE: 98.6
PCO2 ART: 47.4 mmHg — AB (ref 35.0–45.0)
PH ART: 7.291 — AB (ref 7.350–7.450)
PO2 ART: 82 mmHg (ref 80.0–100.0)
TCO2: 20.6 mmol/L (ref 0–100)

## 2013-11-24 LAB — URINALYSIS, ROUTINE W REFLEX MICROSCOPIC
Bilirubin Urine: NEGATIVE
Glucose, UA: 100 mg/dL — AB
Ketones, ur: NEGATIVE mg/dL
Leukocytes, UA: NEGATIVE
Nitrite: NEGATIVE
Protein, ur: NEGATIVE mg/dL
Specific Gravity, Urine: 1.019 (ref 1.005–1.030)
UROBILINOGEN UA: 0.2 mg/dL (ref 0.0–1.0)
pH: 5.5 (ref 5.0–8.0)

## 2013-11-24 LAB — MAGNESIUM: Magnesium: 2.1 mg/dL (ref 1.5–2.5)

## 2013-11-24 LAB — TSH: TSH: 1.62 u[IU]/mL (ref 0.350–4.500)

## 2013-11-24 LAB — MRSA PCR SCREENING: MRSA by PCR: NEGATIVE

## 2013-11-24 MED ORDER — ALBUTEROL SULFATE (2.5 MG/3ML) 0.083% IN NEBU
5.0000 mg | INHALATION_SOLUTION | Freq: Once | RESPIRATORY_TRACT | Status: AC
  Administered 2013-11-24: 5 mg via RESPIRATORY_TRACT

## 2013-11-24 MED ORDER — CLINDAMYCIN PHOSPHATE 600 MG/50ML IV SOLN
600.0000 mg | Freq: Once | INTRAVENOUS | Status: AC
  Administered 2013-11-24: 600 mg via INTRAVENOUS
  Filled 2013-11-24: qty 50

## 2013-11-24 MED ORDER — ENSURE PUDDING PO PUDG
1.0000 | Freq: Three times a day (TID) | ORAL | Status: DC
  Administered 2013-11-24 – 2013-11-25 (×4): 1 via ORAL
  Filled 2013-11-24 (×7): qty 1

## 2013-11-24 MED ORDER — NITROFURANTOIN MACROCRYSTAL 50 MG PO CAPS
50.0000 mg | ORAL_CAPSULE | Freq: Every evening | ORAL | Status: DC
  Administered 2013-11-24 – 2013-11-25 (×2): 50 mg via ORAL
  Filled 2013-11-24 (×2): qty 1

## 2013-11-24 MED ORDER — SERTRALINE HCL 100 MG PO TABS
200.0000 mg | ORAL_TABLET | Freq: Every day | ORAL | Status: DC
  Filled 2013-11-24: qty 4

## 2013-11-24 MED ORDER — DIVALPROEX SODIUM 500 MG PO DR TAB
1000.0000 mg | DELAYED_RELEASE_TABLET | Freq: Every day | ORAL | Status: DC
  Administered 2013-11-24 – 2013-11-25 (×2): 1000 mg via ORAL
  Filled 2013-11-24 (×3): qty 2

## 2013-11-24 MED ORDER — SERTRALINE HCL 100 MG PO TABS
200.0000 mg | ORAL_TABLET | Freq: Every day | ORAL | Status: DC
  Administered 2013-11-25: 200 mg via ORAL
  Filled 2013-11-24 (×2): qty 2

## 2013-11-24 MED ORDER — PIPERACILLIN-TAZOBACTAM 3.375 G IVPB
3.3750 g | Freq: Three times a day (TID) | INTRAVENOUS | Status: DC
  Administered 2013-11-25 (×4): 3.375 g via INTRAVENOUS
  Filled 2013-11-24 (×6): qty 50

## 2013-11-24 MED ORDER — SERTRALINE HCL 100 MG PO TABS
200.0000 mg | ORAL_TABLET | Freq: Every day | ORAL | Status: DC
  Filled 2013-11-24 (×2): qty 2

## 2013-11-24 MED ORDER — ACETAMINOPHEN 325 MG PO TABS
650.0000 mg | ORAL_TABLET | Freq: Once | ORAL | Status: AC
  Administered 2013-11-24: 650 mg via ORAL
  Filled 2013-11-24: qty 2

## 2013-11-24 MED ORDER — CLONAZEPAM 0.5 MG PO TABS
1.0000 mg | ORAL_TABLET | Freq: Once | ORAL | Status: AC
  Administered 2013-11-24: 1 mg via ORAL
  Filled 2013-11-24: qty 2

## 2013-11-24 MED ORDER — CLONAZEPAM 0.5 MG PO TABS
0.5000 mg | ORAL_TABLET | Freq: Two times a day (BID) | ORAL | Status: DC | PRN
  Administered 2013-11-24: 1 mg via ORAL
  Administered 2013-11-25: 0.5 mg via ORAL
  Filled 2013-11-24: qty 1
  Filled 2013-11-24: qty 2

## 2013-11-24 MED ORDER — PIPERACILLIN-TAZOBACTAM 3.375 G IVPB 30 MIN
3.3750 g | Freq: Once | INTRAVENOUS | Status: AC
  Administered 2013-11-24: 3.375 g via INTRAVENOUS
  Filled 2013-11-24: qty 50

## 2013-11-24 MED ORDER — SODIUM CHLORIDE 0.9 % IV SOLN
INTRAVENOUS | Status: AC
  Administered 2013-11-24 (×2): via INTRAVENOUS

## 2013-11-24 MED ORDER — ONDANSETRON HCL 4 MG/2ML IJ SOLN: 4.0000 mg | Freq: Four times a day (QID) | INTRAMUSCULAR | Status: DC | PRN

## 2013-11-24 MED ORDER — ASPIRIN EC 81 MG PO TBEC
81.0000 mg | DELAYED_RELEASE_TABLET | Freq: Every day | ORAL | Status: DC
  Administered 2013-11-25: 81 mg via ORAL
  Filled 2013-11-24 (×2): qty 1

## 2013-11-24 MED ORDER — ALBUTEROL SULFATE (2.5 MG/3ML) 0.083% IN NEBU
2.5000 mg | INHALATION_SOLUTION | Freq: Once | RESPIRATORY_TRACT | Status: AC
  Administered 2013-11-24: 2.5 mg via RESPIRATORY_TRACT
  Filled 2013-11-24: qty 3

## 2013-11-24 MED ORDER — ACETAMINOPHEN 650 MG RE SUPP: 650.0000 mg | Freq: Four times a day (QID) | RECTAL | Status: DC | PRN

## 2013-11-24 MED ORDER — PANTOPRAZOLE SODIUM 40 MG IV SOLR
40.0000 mg | INTRAVENOUS | Status: DC
  Administered 2013-11-24 – 2013-11-25 (×2): 40 mg via INTRAVENOUS
  Filled 2013-11-24 (×3): qty 40

## 2013-11-24 MED ORDER — ONDANSETRON HCL 4 MG PO TABS: 4.0000 mg | ORAL_TABLET | Freq: Four times a day (QID) | ORAL | Status: DC | PRN

## 2013-11-24 MED ORDER — ACETAMINOPHEN 325 MG PO TABS
650.0000 mg | ORAL_TABLET | Freq: Four times a day (QID) | ORAL | Status: DC | PRN
  Administered 2013-11-25 – 2013-11-26 (×4): 650 mg via ORAL
  Filled 2013-11-24 (×4): qty 2

## 2013-11-24 MED ORDER — LEVALBUTEROL HCL 0.63 MG/3ML IN NEBU: 0.6300 mg | INHALATION_SOLUTION | Freq: Four times a day (QID) | RESPIRATORY_TRACT | Status: DC | PRN

## 2013-11-24 MED ORDER — INFLUENZA VAC SPLIT QUAD 0.5 ML IM SUSY
0.5000 mL | PREFILLED_SYRINGE | INTRAMUSCULAR | Status: AC
  Administered 2013-11-25: 0.5 mL via INTRAMUSCULAR
  Filled 2013-11-24 (×2): qty 0.5

## 2013-11-24 MED ORDER — SIMVASTATIN 40 MG PO TABS
40.0000 mg | ORAL_TABLET | Freq: Every evening | ORAL | Status: DC
  Administered 2013-11-24 – 2013-11-25 (×2): 40 mg via ORAL
  Filled 2013-11-24 (×3): qty 1

## 2013-11-24 MED ORDER — IPRATROPIUM BROMIDE 0.02 % IN SOLN
0.5000 mg | Freq: Once | RESPIRATORY_TRACT | Status: AC
  Administered 2013-11-24: 0.5 mg via RESPIRATORY_TRACT
  Filled 2013-11-24: qty 2.5

## 2013-11-24 MED ORDER — ENOXAPARIN SODIUM 40 MG/0.4ML ~~LOC~~ SOLN
40.0000 mg | SUBCUTANEOUS | Status: DC
  Administered 2013-11-24 – 2013-11-25 (×2): 40 mg via SUBCUTANEOUS
  Filled 2013-11-24 (×3): qty 0.4

## 2013-11-24 MED ORDER — SODIUM CHLORIDE 0.9 % IJ SOLN: 3.0000 mL | Freq: Two times a day (BID) | INTRAMUSCULAR | Status: DC

## 2013-11-24 NOTE — ED Notes (Signed)
Per EMS pt with Hx of MR became SOB after drinking and choking on soda, no LOC but increased disorientation, new wheezes bilaterally. EMS applied CPAP, administered 4 mg zofran IV, 5 mg albuterol nebulizer tx, 125 mg solu-medrol IV. Per EMS, wheezing resolved after Tx. Per EMS pt does not wear home O2. Caregiver is en route.

## 2013-11-24 NOTE — ED Notes (Signed)
Bed: WA16 Expected date:  Expected time:  Means of arrival:  Comments: SOB 

## 2013-11-24 NOTE — ED Provider Notes (Signed)
CSN: [id number]     Arrival date & time 11/24/13  1223 History   First MD Initiated Contact with Patient 11/24/13 1248     Chief Complaint  Patient presents with  . Shortness of Breath     (Consider location/radiation/quality/duration/timing/severity/associated sxs/prior Treatment) HPI  Sydney Hatfield is a 58 y.o. female presents for evaluation of shortness of breath, cough, and respiratory distress. The patient was with a caregiver today, when she choked while drinking a soda. Since that time. She has had progressively worse trouble breathing. Another caregiver saw her and was concerned that she may have a pneumonia so called EMS and had her transferred here. During transport. She received Solu-Medrol, nebulizer, and Zofran. The patient had injection of Botox, about 4 weeks ago. Prior episodes of Botox injections, have resulted in swallowing dysfunction that led to pneumonia aspiration and subsequent pneumonia. The patient cannot contribute to history.  Level V caveat- altered mental status.    Past Medical History  Diagnosis Date  . Autistic spectrum disorder   . Anxiety disorder   . Seizure   . MR (mental retardation), moderate   . Torticollis    Past Surgical History  Procedure Laterality Date  . Colonoscopy    . Breast biopsy     Family History  Problem Relation Age of Onset  . Hypertension Mother   . Hypertension Father   . CAD Father    History  Substance Use Topics  . Smoking status: Never Smoker   . Smokeless tobacco: Never Used  . Alcohol Use: No   OB History   Grav Para Term Preterm Abortions TAB SAB Ect Mult Living                 Review of Systems  Unable to perform ROS     Allergies  Carbamazepine; Sulfa antibiotics; and Tessalon  Home Medications   Prior to Admission medications   Medication Sig Start Date End Date Taking? Authorizing Provider  aspirin EC 81 MG tablet Take 81 mg by mouth daily.   Yes Historical Provider, MD  clonazePAM  (KLONOPIN) 0.5 MG tablet Take 0.5-1 mg by mouth 2 (two) times daily as needed. For anxiety   Yes Historical Provider, MD  divalproex (DEPAKOTE) 250 MG DR tablet Take 1,000 mg by mouth at bedtime.   Yes Historical Provider, MD  esomeprazole (NEXIUM) 40 MG capsule Take 40 mg by mouth daily before breakfast.   Yes Historical Provider, MD  feeding supplement (ENSURE) PUDG Take 1 Container by mouth 3 (three) times daily between meals. Vanilla or chocolate 04/04/12  Yes Orson Eva, MD  ferrous sulfate 325 (65 FE) MG tablet Take 650 mg by mouth daily with breakfast.   Yes Historical Provider, MD  nitrofurantoin (MACRODANTIN) 50 MG capsule Take 50 mg by mouth every evening.    Yes Historical Provider, MD  Prenatal Vit-Fe Fumarate-FA (PRENATAL MULTIVITAMIN) TABS tablet Take 1 tablet by mouth daily at 12 noon.   Yes Historical Provider, MD  ranitidine (ZANTAC) 150 MG tablet Take 150 mg by mouth every evening.   Yes Historical Provider, MD  sertraline (ZOLOFT) 100 MG tablet Take 200 mg by mouth daily.   Yes Historical Provider, MD  simvastatin (ZOCOR) 40 MG tablet Take 40 mg by mouth every evening.   Yes Historical Provider, MD   BP 122/78  Pulse 118  Temp(Src) 98.3 F (36.8 C) (Oral)  Resp 31  SpO2 100% Physical Exam  Nursing note and vitals reviewed. Constitutional: She appears well-developed.  Appears older than stated age.  HENT:  Head: Normocephalic and atraumatic.  Eyes: Conjunctivae and EOM are normal. Pupils are equal, round, and reactive to light.  Neck: Normal range of motion and phonation normal. Neck supple.  Cardiovascular: Normal rate and regular rhythm.   Pulmonary/Chest: She is in respiratory distress. She has wheezes. She exhibits no tenderness.  Poor air movement bilaterally. Rhonchorous upper airway noise without stridor.  Abdominal: Soft. She exhibits no distension. There is no tenderness. There is no guarding.  Musculoskeletal: Normal range of motion.  Left lateral neck  position, consistent with torticollis  Neurological: She is alert. She exhibits normal muscle tone.  She is responsive, and speaks softly to her caregiver.  Skin: Skin is warm and dry.  Psychiatric: She has a normal mood and affect. Her behavior is normal.    ED Course  Procedures (including critical care time)   Medications  clindamycin (CLEOCIN) IVPB 600 mg (not administered)  albuterol (PROVENTIL) (2.5 MG/3ML) 0.083% nebulizer solution 5 mg (5 mg Nebulization Given 11/24/13 1238)  albuterol (PROVENTIL) (2.5 MG/3ML) 0.083% nebulizer solution 2.5 mg (2.5 mg Nebulization Given 11/24/13 1339)  ipratropium (ATROVENT) nebulizer solution 0.5 mg (0.5 mg Nebulization Given 11/24/13 1338)    Patient Vitals for the past 24 hrs:  BP Temp Temp src Pulse Resp SpO2  11/24/13 1409 - - - 118 - 100 %  11/24/13 1400 122/78 mmHg - - 122 - 100 %  11/24/13 1339 - - - - - 98 %  11/24/13 1324 - - - 124 - 100 %  11/24/13 1308 - - - 63 31 100 %  11/24/13 1300 - - - 129 39 95 %  11/24/13 1252 - 98.3 F (36.8 C) Oral - - -  11/24/13 1238 - - - - - 95 %  11/24/13 1227 156/121 mmHg - - 128 33 100 %  11/24/13 1223 - - - - - 100 %   3: 32 PM-Consult complete with Dr. Allyson Sabal. Patient case explained and discussed. She agrees to admit patient for further evaluation and treatment. Call ended at 1538   3:29 PM Reevaluation with update and discussion. After initial assessment and treatment, an updated evaluation reveals she is more comfortable. HR 115. RR improved. [name] L     Labs Review Labs Reviewed  BLOOD GAS, ARTERIAL - Abnormal; Notable for the following:    pH, Arterial 7.291 (*)    pCO2 arterial 47.4 (*)    Acid-base deficit 4.0 (*)    All other components within normal limits  CBC WITH DIFFERENTIAL - Abnormal; Notable for the following:    WBC 14.5 (*)    Hemoglobin 11.7 (*)    Neutro Abs 7.9 (*)    Lymphs Abs 5.2 (*)    Monocytes Absolute 1.2 (*)    All other components within normal  limits  BASIC METABOLIC PANEL - Abnormal; Notable for the following:    Glucose, Bld 159 (*)    GFR calc non Af Amer 72 (*)    GFR calc Af Amer 83 (*)    Anion gap 16 (*)    All other components within normal limits  URINALYSIS, ROUTINE W REFLEX MICROSCOPIC - Abnormal; Notable for the following:    Glucose, UA 100 (*)    Hgb urine dipstick MODERATE (*)    All other components within normal limits  URINE MICROSCOPIC-ADD ON - Abnormal; Notable for the following:    Casts HYALINE CASTS (*)    All other components within normal  limits  URINE CULTURE    Imaging Review Dg Chest Portable 1 View  11/24/2013   CLINICAL DATA:  Shortness of breath. Cough. Aspiration of liquid this morning.  EXAM: PORTABLE CHEST - 1 VIEW  COMPARISON:  05/25/2012  FINDINGS: Heart size and pulmonary vascularity are normal. The markings are accentuated bilaterally to to a very shallow inspiration. There are vague nodular densities medially at both lung bases as well slight atelectasis laterally at the left base. This is probably all due to atelectasis and crowding of vessels but I recommend a follow-up chest x-ray with deeper inspiration when possible.  No acute osseous abnormality.  IMPRESSION: Abnormalities at the lung bases as described above. I recommend a follow-up chest x-ray with deeper inspiration with possible. I suspect the findings represent atelectasis.   Electronically Signed   By: Rozetta Nunnery M.D.   On: 11/24/2013 13:46     EKG Interpretation   Date/Time:  Thursday November 24 2013 12:30:50 EDT Ventricular Rate:  135 PR Interval:  148 QRS Duration: 97 QT Interval:  294 QTC Calculation: 441 R Axis:   52 Text Interpretation:  Sinus tachycardia similar to prior Multiform  ventricular premature complexes Probable left atrial enlargement Repol  abnrm suggests ischemia, diffuse leads Non-specific ST-t changes unchanged  since prior Artifact Serial tracing suggested Confirmed by Eulis Foster  MD,  Vira Agar  ([id number]) on 11/24/2013 3:29:29 PM      MDM   Final diagnoses:  Choking, initial encounter  Aspiration into airway, initial encounter   Respiratory distress after choking/aspiration. ABG reveals mild acute respiratory acidosis. She will require admission, prophylactic ABX, and nebulizer treatment.   Nursing Notes Reviewed/ Care Coordinated Applicable Imaging Reviewed Interpretation of Laboratory Data incorporated into ED treatment    Richarda Blade, MD 11/24/13 1621

## 2013-11-24 NOTE — ED Notes (Signed)
X-ray at bedside

## 2013-11-24 NOTE — Progress Notes (Signed)
ANTIBIOTIC CONSULT NOTE - INITIAL  Pharmacy Consult for Zosyn Indication: aspiration pneumonia  Allergies  Allergen Reactions  . Carbamazepine Other (See Comments)    hyponatremia  . Sulfa Antibiotics Other (See Comments)    Unknown reaction  . Tessalon [Benzonatate] Other (See Comments)    dizziness    Patient Measurements:     Vital Signs: Temp: 98.3 F (36.8 C) (09/17 1252) Temp src: Oral (09/17 1252) BP: 122/78 mmHg (09/17 1400) Pulse Rate: 118 (09/17 1409) Intake/Output from previous day:   Intake/Output from this shift:    Labs:  Recent Labs  11/24/13 1312  WBC 14.5*  HGB 11.7*  PLT 297  CREATININE 0.87   The CrCl is unknown because both a height and weight (above a minimum accepted value) are required for this calculation. No results found for this basename: VANCOTROUGH, VANCOPEAK, VANCORANDOM, GENTTROUGH, GENTPEAK, GENTRANDOM, TOBRATROUGH, TOBRAPEAK, TOBRARND, AMIKACINPEAK, AMIKACINTROU, AMIKACIN,  in the last 72 hours   Microbiology: No results found for this or any previous visit (from the past 720 hour(s)).  Medical History: Past Medical History  Diagnosis Date  . Autistic spectrum disorder   . Anxiety disorder   . Seizure   . MR (mental retardation), moderate   . Torticollis     Medications:  Anti-infectives   Start     Dose/Rate Route Frequency Ordered Stop   11/24/13 1530  clindamycin (CLEOCIN) IVPB 600 mg     600 mg 100 mL/hr over 30 Minutes Intravenous  Once 11/24/13 1524       Assessment: 89 yoF admitted 9/17 with suspected aspiration pneumonia.  Earlier today she choked while drinking soda and developed SOB, cough, and respiratory distress.  She has Clindamycin IV x 1 dose in ED.  Pharmacy is consulted to dose Zosyn.  Tmax: 98.3 WBCs: 14.5 Renal: SCr 0.87, CrCl ~ 80 ml/min Normalized Urine culture pending.   Goal of Therapy:  Appropriate abx dosing, eradication of infection.   Plan:   Zosyn 3.375g IV Q8H infused over  4hrs.  Follow up renal fxn and culture results.  Gretta Arab PharmD, BCPS Pager [phone] 11/24/2013 4:29 PM

## 2013-11-24 NOTE — Progress Notes (Signed)
Patient admitted from group home, MRSA PCR swab done per protocol. Information previously charted in infection prevention tab verified with caregiver, patient has not been positive for MDRO, she has been positive for MRSA PCR in the past. Kevan Rosebush L, RN,11/24/2013, at 2200.

## 2013-11-24 NOTE — H&P (Signed)
[facility] History and Physical  Sydney Hatfield FAO:[id number] DOB: 1955/04/25 DOA: 11/24/2013  Referring physician:     PCP: Gennette Pac, MD   Chief Complaint:Shortness of Breath HPI:  58 yo female h/o moderate mental retardation, autism, seizure disorder who lives in a group home comes in with an aspiration event. Patient became short of breath after drinking soda and had a choking episode. She presented with shortness of breath associated with wheezing, she received IV Zofran, albuterol nebulizer, IV Solu-Medrol ENroute by EMS and received clindamycin in the ED for suspected aspiration . She had a similar aspiration event in January of this year. She has never been intubated but is a full code. The patient has a neurologist Dr. Nada Maclachlan at [facility] . 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 Caregiver from the group home states that patient's swallowing usually gets worse if the injection penetrates the swallowing muscles during the injection .Patient had a full swallow evaluation done during her last admission.Patient failed MBS with high aspiration risk  On 03/29/2012 . Mother wanted patient to eat Despite high aspiration risk .Discussed risk of recurrent aspiration and worsening pulmonary infection. Mother expresses understanding of the risks involved and wishes to continue giving oral feedings,mother does not wish for gastrostomy tube at this time--risks, benefits, and alternatives were discussed with the patient's mother. Patient has been seizure free. Last seizure was 5 years ago She has been slowly declining in terms of her appetite but has not lost any weight    Review of Systems: negative for the following  Constitutional: Denies fever, chills, diaphoresis, appetite change and fatigue.  HEENT: Denies photophobia, eye pain, redness, hearing loss, ear pain, congestion, sore throat, rhinorrhea, sneezing, mouth  sores, trouble swallowing, neck pain, neck stiffness and tinnitus.  Respiratory: Positive for SOB, DOE, cough, chest tightness, and positive for wheezing.  Cardiovascular: Denies chest pain, palpitations and leg swelling.  Gastrointestinal: Denies nausea, vomiting, abdominal pain, diarrhea, constipation, blood in stool and abdominal distention.  Genitourinary: Denies dysuria, urgency, frequency, hematuria, flank pain and difficulty urinating.  Musculoskeletal: Denies myalgias, back pain, joint swelling, arthralgias and gait problem.  Skin: Denies pallor, rash and wound.  Neurological: Denies dizziness, seizures, syncope, weakness, light-headedness, numbness and headaches.  Hematological: Denies adenopathy. Easy bruising, personal or family bleeding history  Psychiatric/Behavioral: Denies suicidal ideation, mood changes, confusion, nervousness, sleep disturbance and agitation       Past Medical History  Diagnosis Date  . Autistic spectrum disorder   . Anxiety disorder   . Seizure   . MR (mental retardation), moderate   . Torticollis      Past Surgical History  Procedure Laterality Date  . Colonoscopy    . Breast biopsy        Social History:  reports that she has never smoked. She has never used smokeless tobacco. She reports that she does not drink alcohol or use illicit drugs.    Allergies  Allergen Reactions  . Carbamazepine Other (See Comments)    hyponatremia  . Sulfa Antibiotics Other (See Comments)    Unknown reaction  . Tessalon [Benzonatate] Other (See Comments)    dizziness    Family History  Problem Relation Age of Onset  . Hypertension Mother   . Hypertension Father   . CAD Father      Prior to Admission medications   Medication Sig Start Date End Date Taking? Authorizing Provider  aspirin EC 81 MG tablet Take 81 mg by  mouth daily.   Yes Historical Provider, MD  clonazePAM (KLONOPIN) 0.5 MG tablet Take 0.5-1 mg by mouth 2 (two) times daily as  needed. For anxiety   Yes Historical Provider, MD  divalproex (DEPAKOTE) 250 MG DR tablet Take 1,000 mg by mouth at bedtime.   Yes Historical Provider, MD  esomeprazole (NEXIUM) 40 MG capsule Take 40 mg by mouth daily before breakfast.   Yes Historical Provider, MD  feeding supplement (ENSURE) PUDG Take 1 Container by mouth 3 (three) times daily between meals. Vanilla or chocolate 04/04/12  Yes Orson Eva, MD  ferrous sulfate 325 (65 FE) MG tablet Take 650 mg by mouth daily with breakfast.   Yes Historical Provider, MD  nitrofurantoin (MACRODANTIN) 50 MG capsule Take 50 mg by mouth every evening.    Yes Historical Provider, MD  Prenatal Vit-Fe Fumarate-FA (PRENATAL MULTIVITAMIN) TABS tablet Take 1 tablet by mouth daily at 12 noon.   Yes Historical Provider, MD  ranitidine (ZANTAC) 150 MG tablet Take 150 mg by mouth every evening.   Yes Historical Provider, MD  sertraline (ZOLOFT) 100 MG tablet Take 200 mg by mouth daily.   Yes Historical Provider, MD  simvastatin (ZOCOR) 40 MG tablet Take 40 mg by mouth every evening.   Yes Historical Provider, MD     Physical Exam: Filed Vitals:   11/24/13 1324 11/24/13 1339 11/24/13 1400 11/24/13 1409  BP:   122/78   Pulse: 124  122 118  Temp:      TempSrc:      Resp:      SpO2: 100% 98% 100% 100%     Constitutional: Vital signs reviewed. Patient is a well-developed and well-nourished in no acute distress and cooperative with exam.Appears older than stated age  Head: Normocephalic and atraumatic  Ear: TM normal bilaterally  Mouth: no erythema or exudates, MMM  Eyes: PERRL, EOMI, conjunctivae normal, No scleral icterus.  Neck: Supple, Trachea midline normal ROM, No JVD, mass, thyromegaly, or carotid bruit present.  Cardiovascular: RRR, S1 normal, S2 normal, no MRG, pulses symmetric and intact bilaterally  Pulmonary/Chest: CTAB, no wheezes, rales, or rhonchi  Abdominal: Soft. Non-tender, non-distended, bowel sounds are normal, no masses, organomegaly,  or guarding present.  GU: no CVA tenderness Musculoskeletal: No joint deformities, erythema, or stiffness, ROM full and no nontender Ext: no edema and no cyanosis, pulses palpable bilaterally (DP and PT)  Hematology: no cervical, inginal, or axillary adenopathy.  Neurological: A&O x3, Strenght is normal and symmetric bilaterally, cranial nerve II-XII are grossly intact, no focal motor deficit, sensory intact to light touch bilaterally.  Skin: Warm, dry and intact. No rash, cyanosis, or clubbing.  Psychiatric: Normal mood and affect. speech and behavior is normal. Judgment and thought content normal. Cognition and memory are normal.       Labs on Admission:    Basic Metabolic Panel:  Recent Labs Lab 11/24/13 1312  NA 138  K 4.1  CL 100  CO2 22  GLUCOSE 159*  BUN 18  CREATININE 0.87  CALCIUM 9.6   Liver Function Tests: No results found for this basename: AST, ALT, ALKPHOS, BILITOT, PROT, ALBUMIN,  in the last 168 hours No results found for this basename: LIPASE, AMYLASE,  in the last 168 hours No results found for this basename: AMMONIA,  in the last 168 hours CBC:  Recent Labs Lab 11/24/13 1312  WBC 14.5*  NEUTROABS 7.9*  HGB 11.7*  HCT 36.2  MCV 91.4  PLT 297   Cardiac Enzymes: No results  found for this basename: CKTOTAL, CKMB, CKMBINDEX, TROPONINI,  in the last 168 hours  BNP (last 3 results) No results found for this basename: PROBNP,  in the last 8760 hours    CBG: No results found for this basename: GLUCAP,  in the last 168 hours  Radiological Exams on Admission: Dg Chest Portable 1 View  11/24/2013   CLINICAL DATA:  Shortness of breath. Cough. Aspiration of liquid this morning.  EXAM: PORTABLE CHEST - 1 VIEW  COMPARISON:  05/25/2012  FINDINGS: Heart size and pulmonary vascularity are normal. The markings are accentuated bilaterally to to a very shallow inspiration. There are vague nodular densities medially at both lung bases as well slight atelectasis  laterally at the left base. This is probably all due to atelectasis and crowding of vessels but I recommend a follow-up chest x-ray with deeper inspiration when possible.  No acute osseous abnormality.  IMPRESSION: Abnormalities at the lung bases as described above. I recommend a follow-up chest x-ray with deeper inspiration with possible. I suspect the findings represent atelectasis.   Electronically Signed   By: Rozetta Nunnery M.D.   On: 11/24/2013 13:46    EKG: Independently reviewed.   Assessment/Plan Active Problems:   Aspiration into airway   Aspiration pneumonitis   Aspiration pneumonitis Chest x-ray shows nodular densities No obvious pneumonia Received clindamycin in the ER Empirically start the patient on Zosyn Aspiration precautions Speech therapy evaluation   History of seizure disorder  Seizure free for 5 years Seizure precautions, fall precautions Depakote level is therapeutic. Continue current dose of Depakote.   History of mental retardation with agitation.  Continue her home dose of Klonopin. Continue Zoloft  Fall precautions  Chronic torticollis  Patient has been taking botulinum toxin toxin injection every 3 months for past 1.5 yrs  and follows with Dr. Nada Maclachlan at [facility]. Last injection received 2 weeks ago   Iron Deficiency anemia  Continue iron supplements     Code Status: She is a full code Family Communication: bedside Disposition Plan: admit   Time spent: 70 mins   [facility] Pager [phone]  If 7PM-7AM, please contact night-coverage [email or web address] Password [facility] 11/24/2013, 4:24 PM

## 2013-11-24 NOTE — ED Notes (Signed)
Patient placed on bedpan, unable to urinate.

## 2013-11-25 ENCOUNTER — Observation Stay (HOSPITAL_COMMUNITY): Payer: Medicare Other

## 2013-11-25 DIAGNOSIS — D72829 Elevated white blood cell count, unspecified: Secondary | ICD-10-CM

## 2013-11-25 DIAGNOSIS — Z79899 Other long term (current) drug therapy: Secondary | ICD-10-CM | POA: Diagnosis not present

## 2013-11-25 DIAGNOSIS — G40909 Epilepsy, unspecified, not intractable, without status epilepticus: Secondary | ICD-10-CM | POA: Diagnosis present

## 2013-11-25 DIAGNOSIS — D509 Iron deficiency anemia, unspecified: Secondary | ICD-10-CM | POA: Diagnosis present

## 2013-11-25 DIAGNOSIS — Z7982 Long term (current) use of aspirin: Secondary | ICD-10-CM | POA: Diagnosis not present

## 2013-11-25 DIAGNOSIS — R1310 Dysphagia, unspecified: Secondary | ICD-10-CM

## 2013-11-25 DIAGNOSIS — F840 Autistic disorder: Secondary | ICD-10-CM | POA: Diagnosis present

## 2013-11-25 DIAGNOSIS — F411 Generalized anxiety disorder: Secondary | ICD-10-CM

## 2013-11-25 DIAGNOSIS — Z23 Encounter for immunization: Secondary | ICD-10-CM | POA: Diagnosis not present

## 2013-11-25 DIAGNOSIS — M436 Torticollis: Secondary | ICD-10-CM | POA: Diagnosis present

## 2013-11-25 DIAGNOSIS — R0602 Shortness of breath: Secondary | ICD-10-CM | POA: Diagnosis present

## 2013-11-25 DIAGNOSIS — J690 Pneumonitis due to inhalation of food and vomit: Principal | ICD-10-CM

## 2013-11-25 DIAGNOSIS — F71 Moderate intellectual disabilities: Secondary | ICD-10-CM | POA: Diagnosis present

## 2013-11-25 LAB — COMPREHENSIVE METABOLIC PANEL
ALT: 6 U/L (ref 0–35)
ANION GAP: 13 (ref 5–15)
AST: 26 U/L (ref 0–37)
Albumin: 3.2 g/dL — ABNORMAL LOW (ref 3.5–5.2)
Alkaline Phosphatase: 92 U/L (ref 39–117)
BUN: 12 mg/dL (ref 6–23)
CALCIUM: 9.3 mg/dL (ref 8.4–10.5)
CO2: 26 meq/L (ref 19–32)
CREATININE: 0.85 mg/dL (ref 0.50–1.10)
Chloride: 96 mEq/L (ref 96–112)
GFR calc Af Amer: 86 mL/min — ABNORMAL LOW (ref >90)
GFR, EST NON AFRICAN AMERICAN: 74 mL/min — AB (ref >90)
GLUCOSE: 89 mg/dL (ref 70–99)
Potassium: 4.8 mEq/L (ref 3.7–5.3)
Sodium: 135 mEq/L — ABNORMAL LOW (ref 137–147)
Total Bilirubin: 0.3 mg/dL (ref 0.3–1.2)
Total Protein: 6.9 g/dL (ref 6.0–8.3)

## 2013-11-25 LAB — CBC
HCT: 31.2 % — ABNORMAL LOW (ref 36.0–46.0)
Hemoglobin: 10.6 g/dL — ABNORMAL LOW (ref 12.0–15.0)
MCH: 30.4 pg (ref 26.0–34.0)
MCHC: 34 g/dL (ref 30.0–36.0)
MCV: 89.4 fL (ref 78.0–100.0)
PLATELETS: 230 10*3/uL (ref 150–400)
RBC: 3.49 MIL/uL — ABNORMAL LOW (ref 3.87–5.11)
RDW: 14 % (ref 11.5–15.5)
WBC: 13.6 10*3/uL — AB (ref 4.0–10.5)

## 2013-11-25 LAB — GLUCOSE, CAPILLARY: Glucose-Capillary: 104 mg/dL — ABNORMAL HIGH (ref 70–99)

## 2013-11-25 NOTE — Progress Notes (Signed)
PROGRESS NOTE  Sydney Hatfield TJQ:[id number] DOB: 1955-03-15 DOA: 11/24/2013 PCP: Sydney Pac, MD  Assessment/Plan: aspiration pneumonia  -Patient received clindamycin emergency department -Switched to Zosyn after admission-->continue  -Follow attenuation by speech therapy--> medication strategies provided--> continue regular diet with thin liquids Dysphagia with risk for aspiration  -seen by speech therapy and high risk for aspiration.  -May have intermittent worsening with botulinum toxin injections for cervical torticollis -Medication strategies provided by speech therapy especially around times of botulinum toxin injection- -Continue head of bed elevation.  -Counseled patient on eating slowly and quiet environment  -Although high risk of aspiration remains, mother does not wish for gastrostomy tube at this time--risks, benefits, and alternatives were discussed with the patient's mother  History of seizure disorder  -Seizure-free for 5 years  Depakote level is therapeutic.  -Continue current dose of Depakote.  History of mental retardation with agitation.  Continue her home dose of Klonopin. Continue Zoloft   Chronic torticollis  Patient has been taking botulinum toxin toxin injection every 3 months -follows with Dr. Nada Hatfield at [facility].  Last injection received 2 weeks ago  Iron Deficiency anemia  Continue iron supplements  Family Communication:   Mother updated at beside Disposition Plan:   Home when medically stable     Antibiotics:  Zosyn 11/24/13>>>    Procedures/Studies: Portable Chest 1 View  11/25/2013   CLINICAL DATA:  Pneumonia, followup  EXAM: PORTABLE CHEST - 1 VIEW  COMPARISON:  Portable chest x-ray of 11/24/2013  FINDINGS: The lungs are less well aerated with increase in basilar atelectasis. The nodular area questioned medially at the right lung base cannot be well assessed due to the poor inspiration. Mild cardiomegaly  is stable. No bony abnormality is seen.  IMPRESSION: Diminished aeration with increase in bibasilar opacity most consistent with atelectasis.   Electronically Signed   By: Sydney Hatfield M.D.   On: 11/25/2013 07:52   Dg Chest Portable 1 View  11/24/2013   CLINICAL DATA:  Shortness of breath. Cough. Aspiration of liquid this morning.  EXAM: PORTABLE CHEST - 1 VIEW  COMPARISON:  05/25/2012  FINDINGS: Heart size and pulmonary vascularity are normal. The markings are accentuated bilaterally to to a very shallow inspiration. There are vague nodular densities medially at both lung bases as well slight atelectasis laterally at the left base. This is probably all due to atelectasis and crowding of vessels but I recommend a follow-up chest x-ray with deeper inspiration when possible.  No acute osseous abnormality.  IMPRESSION: Abnormalities at the lung bases as described above. I recommend a follow-up chest x-ray with deeper inspiration with possible. I suspect the findings represent atelectasis.   Electronically Signed   By: Sydney Hatfield M.D.   On: 11/24/2013 13:46         Subjective: Patient denies fevers, chills, headache, chest pain, dyspnea, nausea, vomiting, diarrhea, abdominal pain, dysuria, hematuria   Objective: Filed Vitals:   11/24/13 1910 11/24/13 2033 11/25/13 0432 11/25/13 1355  BP:  111/55 113/67 114/76  Pulse:  98 88 86  Temp:  98.2 F (36.8 C) 98.1 F (36.7 C) 97.5 F (36.4 C)  TempSrc:  Oral Oral Oral  Resp:  20 16 18   Height: 5\' 2"  (1.575 m)     Weight: 61.871 kg (136 lb 6.4 oz)     SpO2:  100% 100% 97%    Intake/Output Summary (Last 24 hours) at 11/25/13 1839 Last data filed  at 11/25/13 1300  Gross per 24 hour  Intake    650 ml  Output    600 ml  Net     50 ml   Weight change:  Exam:   General:  Pt is alert, follows commands appropriately, not in acute distress  HEENT: No icterus, No thrush,Crellin/AT  Cardiovascular: RRR, S1/S2, no rubs, no gallops  Respiratory:  Bibasilar crackles. Left clear to auscultation. No wheezing.   Abdomen: Soft/+BS, non tender, non distended, no guarding  Extremities: trace LE edema, No lymphangitis, No petechiae, No rashes, no synovitis  Data Reviewed: Basic Metabolic Panel:  Recent Labs Lab 11/24/13 1312 11/24/13 1812 11/25/13 0443  NA 138  --  135*  K 4.1  --  4.8  CL 100  --  96  CO2 22  --  26  GLUCOSE 159*  --  89  BUN 18  --  12  CREATININE 0.87  --  0.85  CALCIUM 9.6  --  9.3  MG  --  2.1  --    Liver Function Tests:  Recent Labs Lab 11/25/13 0443  AST 26  ALT 6  ALKPHOS 92  BILITOT 0.3  PROT 6.9  ALBUMIN 3.2*   No results found for this basename: LIPASE, AMYLASE,  in the last 168 hours No results found for this basename: AMMONIA,  in the last 168 hours CBC:  Recent Labs Lab 11/24/13 1312 11/25/13 0443  WBC 14.5* 13.6*  NEUTROABS 7.9*  --   HGB 11.7* 10.6*  HCT 36.2 31.2*  MCV 91.4 89.4  PLT 297 230   Cardiac Enzymes: No results found for this basename: CKTOTAL, CKMB, CKMBINDEX, TROPONINI,  in the last 168 hours BNP: No components found with this basename: POCBNP,  CBG: No results found for this basename: GLUCAP,  in the last 168 hours  Recent Results (from the past 240 hour(s))  MRSA PCR SCREENING     Status: None   Collection Time    11/24/13  9:51 PM      Result Value Ref Range Status   MRSA by PCR NEGATIVE  NEGATIVE Final   Comment:            The GeneXpert MRSA Assay (FDA     approved for NASAL specimens     only), is one component of a     comprehensive MRSA colonization     surveillance program. It is not     intended to diagnose MRSA     infection nor to guide or     monitor treatment for     MRSA infections.     Scheduled Meds: . aspirin EC  81 mg Oral Daily  . divalproex  1,000 mg Oral QHS  . enoxaparin (LOVENOX) injection  40 mg Subcutaneous Q24H  . feeding supplement (ENSURE)  1 Container Oral TID BM  . nitrofurantoin  50 mg Oral QPM  .  pantoprazole (PROTONIX) IV  40 mg Intravenous Q24H  . piperacillin-tazobactam (ZOSYN)  IV  3.375 g Intravenous Q8H  . sertraline  200 mg Oral Daily  . sertraline  200 mg Oral Daily  . simvastatin  40 mg Oral QPM  . sodium chloride  3 mL Intravenous Q12H   Continuous Infusions:    Sydney Tagle, DO  [facility] Pager [phone]  If 7PM-7AM, please contact night-coverage [email or web address] Password [facility] 11/25/2013, 6:39 PM   LOS: 1 day

## 2013-11-25 NOTE — Evaluation (Signed)
Clinical/Bedside Swallow Evaluation Patient Details  Name: Sydney Hatfield MRN: [id number] Date of Birth: 1955/08/20  Today's Date: 11/25/2013 Time: 9357-0177 SLP Time Calculation (min): 48 min  Past Medical History:  Past Medical History  Diagnosis Date  . Autistic spectrum disorder   . Anxiety disorder   . Seizure   . MR (mental retardation), moderate   . Torticollis   . Hx of migraines    Past Surgical History:  Past Surgical History  Procedure Laterality Date  . Colonoscopy    . Breast biopsy     HPI:  58 yo female with h/o moderate MR, autism, seizure d/o, previous aspiration event, torticollis s/p Botox injections every 3 months, dysphagia admitted after aspiration event with soda.  Pt has previously undergone MBS study with findings of moderate oropharyngeal, cervical esophageal dysphagia and pt found to be at asp risk due to weakness, stasis, sensorimotor dysphagia and inability to follow directions to conduct dry swallows. QOL deemed important for pt per mother and pt had been placed on diet with accepted risks.    Swallow evaluation ordered.  CXR showed lower lobe ATX.  Marland Kitchen     Assessment / Plan / Recommendation Clinical Impression  Pt has chronic dysphagia that is normally exacerbated approximately one month after botox injections per caregivers.    During previous admit in January 2014 after pt had MBS, pt's mother had expressed desire for pt to consume diet with accepted aspiration risk and strategies for mitigation.  Pt continues to demonstrate difficulties with swallowing including multiple swallows, delayed throat clearing/cough.  She appears better currently than during previous hospital admit January 2014.    SLP educated caregivers and mother to diet modifications/precautions to mitigate risk especially after botox injections.  Further advised to watch for signs of congestion/coughing that may indicate aspiration pna.   Also infomed pt/family/caregivers to items  better tolerated if aspirated.  Per caregivers, pt does cough some with intake and this is worse after botox tx.  Note CXR negative.    Recommend to allow regular/thin diet to allow caregivers/mother to choose items pt can manage to swallow.  Pt will continue to be a chronic aspiration risk and informed mother/caregivers to such.  Will sign off as all education completed and repeat MBS will not change pt's outcomes/care plan.  Thanks for this consult.    Aspiration Risk    ongoing, moderate and variable   Diet Recommendation Regular;Thin liquid   Liquid Administration via: Cup Supervision: Patient able to self feed Compensations: Slow rate;Small sips/bites Postural Changes and/or Swallow Maneuvers: Seated upright 90 degrees;Upright 30-60 min after meal;Out of bed for meals    Other  Recommendations Oral Care Recommendations: Oral care BID   Follow Up Recommendations  None    Frequency and Duration        Pertinent Vitals/Pain Afebrile, decreased     Swallow Study Prior Functional Status   see HHX, pt maintaining weight over all, feeds self and eats at slow rate per pt/family/caregiver, consumes 3 Ensures daily    General Date of Onset: 11/25/13 HPI: 58 yo female with h/o moderate MR, autism, seizure d/o, previous aspiration event, torticollis s/p Botox injections every 3 months, dysphagia admitted after aspiration event with soda.  Pt has previously undergone MBS study with findings of moderate oropharyngeal, cervical esophageal dysphagia and pt found to be at asp risk due to weakness, stasis, sensorimotor dysphagia and inability to follow directions to conduct dry swallows. QOL deemed important for pt per mother and  pt had been placed on diet with accepted risks.    Swallow evaluation ordered.  CXR showed lower lobe ATX.  .   Type of Study: Bedside swallow evaluation Diet Prior to this Study: Thin liquids (clears) Temperature Spikes Noted: No Respiratory Status: Room air History  of Recent Intubation: No Behavior/Cognition: Alert;Cooperative;Pleasant mood Oral Cavity - Dentition: Adequate natural dentition Self-Feeding Abilities: Able to feed self Patient Positioning: Postural control interferes with function (pt appears with kyphosis likely impairing esophageal clearance) Baseline Vocal Quality: Low vocal intensity Volitional Cough: Weak Volitional Swallow: Unable to elicit    Oral/Motor/Sensory Function Overall Oral Motor/Sensory Function:  (no focal cranial nerve deficits, however pt with known sensorimotor deficits during previous MBS)   Ice Chips Ice chips: Not tested   Thin Liquid Thin Liquid: Impaired Presentation: Cup Oral Phase Impairments: Reduced lingual movement/coordination;Impaired anterior to posterior transit Oral Phase Functional Implications: Prolonged oral transit Pharyngeal  Phase Impairments: Throat Clearing - Immediate;Multiple swallows;Cough - Delayed    Nectar Thick Nectar Thick Liquid: Not tested   Honey Thick Honey Thick Liquid: Not tested   Puree Puree: Impaired Presentation: Self Fed;Spoon Oral Phase Impairments: Reduced lingual movement/coordination;Impaired anterior to posterior transit Oral Phase Functional Implications: Prolonged oral transit Pharyngeal Phase Impairments: Suspected delayed Swallow;Multiple swallows;Throat Clearing - Delayed   Solid   GO    Solid: Impaired Presentation: Spoon Oral Phase Impairments: Impaired anterior to posterior transit;Reduced lingual movement/coordination Oral Phase Functional Implications: Other (comment) (prolonged oral transiting) Pharyngeal Phase Impairments: Suspected delayed Swallow       Claudie Fisherman, [facility] SLP [phone]

## 2013-11-25 NOTE — Progress Notes (Signed)
Utilization Review Completed.Zymire Turnbo T9/18/2015  

## 2013-11-26 DIAGNOSIS — F840 Autistic disorder: Secondary | ICD-10-CM

## 2013-11-26 LAB — BASIC METABOLIC PANEL
Anion gap: 16 — ABNORMAL HIGH (ref 5–15)
BUN: 12 mg/dL (ref 6–23)
CO2: 23 mEq/L (ref 19–32)
Calcium: 9.7 mg/dL (ref 8.4–10.5)
Chloride: 92 mEq/L — ABNORMAL LOW (ref 96–112)
Creatinine, Ser: 0.91 mg/dL (ref 0.50–1.10)
GFR calc Af Amer: 79 mL/min — ABNORMAL LOW (ref >90)
GFR calc non Af Amer: 68 mL/min — ABNORMAL LOW (ref >90)
Glucose, Bld: 82 mg/dL (ref 70–99)
Potassium: 4.7 mEq/L (ref 3.7–5.3)
Sodium: 131 mEq/L — ABNORMAL LOW (ref 137–147)

## 2013-11-26 LAB — CBC
HCT: 36.7 % (ref 36.0–46.0)
Hemoglobin: 12.4 g/dL (ref 12.0–15.0)
MCH: 31 pg (ref 26.0–34.0)
MCHC: 33.8 g/dL (ref 30.0–36.0)
MCV: 91.8 fL (ref 78.0–100.0)
Platelets: 221 10*3/uL (ref 150–400)
RBC: 4 MIL/uL (ref 3.87–5.11)
RDW: 14.1 % (ref 11.5–15.5)
WBC: 11.7 10*3/uL — ABNORMAL HIGH (ref 4.0–10.5)

## 2013-11-26 LAB — URINE CULTURE
Colony Count: NO GROWTH
Culture: NO GROWTH

## 2013-11-26 MED ORDER — CLINDAMYCIN HCL 300 MG PO CAPS
300.0000 mg | ORAL_CAPSULE | Freq: Three times a day (TID) | ORAL | Status: DC
  Filled 2013-11-26 (×3): qty 1

## 2013-11-26 MED ORDER — CLINDAMYCIN HCL 300 MG PO CAPS: 300.0000 mg | ORAL_CAPSULE | Freq: Three times a day (TID) | ORAL | Status: DC

## 2013-11-26 MED ORDER — AMOXICILLIN-POT CLAVULANATE 875-125 MG PO TABS
1.0000 | ORAL_TABLET | Freq: Two times a day (BID) | ORAL | Status: DC
  Filled 2013-11-26 (×2): qty 1

## 2013-11-26 MED ORDER — AMOXICILLIN-POT CLAVULANATE 875-125 MG PO TABS: 1.0000 | ORAL_TABLET | Freq: Two times a day (BID) | ORAL | Status: DC

## 2013-11-26 NOTE — Progress Notes (Signed)
Patient very anxious for d/c this morning and caregiver requested that all patient's meds be held until she returned to her group home. Discharge instructions and new prescriptions gone over with patient's caregiver, caregiver had no further questions. NT transported patient to caregiver's car for d/c to her group home. Patient and caregiver thanked for their patience with the d/c process.

## 2013-11-26 NOTE — Discharge Summary (Signed)
Physician Discharge Summary  Sydney Hatfield:[id number] DOB: Mar 31, 1955 DOA: 11/24/2013  PCP: Gennette Pac, MD  Admit date: 11/24/2013 Discharge date: 11/26/2013  Recommendations for Outpatient Follow-up:  1. Pt will need to follow up with PCP in 2 weeks post discharge 2. Check CBC and BMP  Discharge Diagnoses:  aspiration pneumonia  -Patient received clindamycin emergency department  -Switched to Zosyn after admission-->continue  -Follow attenuation by speech therapy--> mitigation strategies provided--> continue regular diet with thin liquids  -Patient will go home with Augmentin and clindamycin for 6 more days to complete 7 days of therapy. -The patient remained afebrile and hemodynamically stable without oxygen desaturation. Dysphagia with risk for aspiration  -seen by speech therapy and high risk for aspiration.  -May have intermittent worsening with botulinum toxin injections for cervical torticollis  -Mitigation strategies provided by speech therapy especially around times of botulinum toxin injection-  -Continue head of bed elevation.  -Counseled patient on eating slowly and quiet environment  -Although high risk of aspiration remains, mother does not wish for gastrostomy tube at this time--risks, benefits, and alternatives were discussed with the patient's mother  History of seizure disorder  -Seizure-free for 5 years  Depakote level is therapeutic.  -Continue current dose of Depakote.  History of mental retardation with agitation.  Continue her home dose of Klonopin. Continue Zoloft  Chronic torticollis  Patient has been taking botulinum toxin toxin injection every 3 months  -follows with Dr. Nada Maclachlan at [facility].  Last injection received 2 weeks ago  Iron Deficiency anemia  Continue iron supplements    Antibiotics:  Zosyn 11/24/13>>>11/26/13   Discharge Condition:   Disposition: back to group home  Diet:regular Wt Readings from Last  3 Encounters:  11/24/13 61.871 kg (136 lb 6.4 oz)  03/29/12 64.4 kg (141 lb 15.6 oz)    History of present illness:  58 yo female h/o moderate mental retardation, autism, seizure disorder who lives in a group home comes in with an aspiration event. Patient became short of breath after drinking soda and had a choking episode.She presented with shortness of breath associated with wheezing, she received IV Zofran, albuterol nebulizer, IV Solu-Medrol ENroute by EMS and received clindamycin in the ED for suspected aspiration.  The patient was previously admitted in January 2014 with acute heart failure secondary to aspiration pneumonia. The patient actually has done very well since that time without any other episodes of aspiration pneumonia. The patient received an injection of botulinum toxin 2.5 weeks ago for her cervical torticollis. There was concern that this may have made her swallowing worse. The patient was noted to have WBC 14.5 at time of admission and was wheezing at the time without any frank respiratory distress. The patient was started on Zosyn after admission to    medical floor. Speech therapy evaluated the patient and recommended regular diet with thin liquids. Medication strategies were provided to the patient's family by speech therapy to mitigate the risk of aspiration particularly after her botulinum toxin injections. The patient did well clinically without any further fevers or oxygen desaturation. The patient was discharged home with oral antibiotics for 6 additional days.     Discharge Exam: Filed Vitals:   11/26/13 0446  BP: 113/68  Pulse: 72  Temp: 97.6 F (36.4 C)  Resp: 18   Filed Vitals:   11/25/13 0432 11/25/13 1355 11/25/13 2302 11/26/13 0446  BP: 113/67 114/76 125/74 113/68  Pulse: 88 86 76 72  Temp: 98.1 F (36.7 C) 97.5 F (  36.4 C) 97.4 F (36.3 C) 97.6 F (36.4 C)  TempSrc: Oral Oral Axillary Axillary  Resp: 16 18 20 18   Height:      Weight:      SpO2:  100% 97% 95% 98%   General: Alert and awake, NAD, pleasant, cooperative Cardiovascular: RRR, no rub, no gallop, no S3 Respiratory: Bibasilar rales without wheezing. Good air movement  Abdomen:soft, nontender, nondistended, positive bowel sounds Extremities: No edema, No lymphangitis, no petechiae  Discharge Instructions      Discharge Instructions   Diet - low sodium heart healthy    Complete by:  As directed      Increase activity slowly    Complete by:  As directed             Medication List         amoxicillin-clavulanate 875-125 MG per tablet  Commonly known as:  AUGMENTIN  Take 1 tablet by mouth every 12 (twelve) hours.     aspirin EC 81 MG tablet  Take 81 mg by mouth daily.     clindamycin 300 MG capsule  Commonly known as:  CLEOCIN  Take 1 capsule (300 mg total) by mouth every 8 (eight) hours.     clonazePAM 0.5 MG tablet  Commonly known as:  KLONOPIN  Take 0.5-1 mg by mouth 2 (two) times daily as needed. For anxiety     divalproex 250 MG DR tablet  Commonly known as:  DEPAKOTE  Take 1,000 mg by mouth at bedtime.     esomeprazole 40 MG capsule  Commonly known as:  NEXIUM  Take 40 mg by mouth daily before breakfast.     feeding supplement (ENSURE) Pudg  Take 1 Container by mouth 3 (three) times daily between meals. Vanilla or chocolate     ferrous sulfate 325 (65 FE) MG tablet  Take 650 mg by mouth daily with breakfast.     nitrofurantoin 50 MG capsule  Commonly known as:  MACRODANTIN  Take 50 mg by mouth every evening.     prenatal multivitamin Tabs tablet  Take 1 tablet by mouth daily at 12 noon.     ranitidine 150 MG tablet  Commonly known as:  ZANTAC  Take 150 mg by mouth every evening.     sertraline 100 MG tablet  Commonly known as:  ZOLOFT  Take 200 mg by mouth daily.     simvastatin 40 MG tablet  Commonly known as:  ZOCOR  Take 40 mg by mouth every evening.         The results of significant diagnostics from this  hospitalization (including imaging, microbiology, ancillary and laboratory) are listed below for reference.    Significant Diagnostic Studies: Portable Chest 1 View  11/25/2013   CLINICAL DATA:  Pneumonia, followup  EXAM: PORTABLE CHEST - 1 VIEW  COMPARISON:  Portable chest x-ray of 11/24/2013  FINDINGS: The lungs are less well aerated with increase in basilar atelectasis. The nodular area questioned medially at the right lung base cannot be well assessed due to the poor inspiration. Mild cardiomegaly is stable. No bony abnormality is seen.  IMPRESSION: Diminished aeration with increase in bibasilar opacity most consistent with atelectasis.   Electronically Signed   By: Ivar Drape M.D.   On: 11/25/2013 07:52   Dg Chest Portable 1 View  11/24/2013   CLINICAL DATA:  Shortness of breath. Cough. Aspiration of liquid this morning.  EXAM: PORTABLE CHEST - 1 VIEW  COMPARISON:  05/25/2012  FINDINGS: Heart size  and pulmonary vascularity are normal. The markings are accentuated bilaterally to to a very shallow inspiration. There are vague nodular densities medially at both lung bases as well slight atelectasis laterally at the left base. This is probably all due to atelectasis and crowding of vessels but I recommend a follow-up chest x-ray with deeper inspiration when possible.  No acute osseous abnormality.  IMPRESSION: Abnormalities at the lung bases as described above. I recommend a follow-up chest x-ray with deeper inspiration with possible. I suspect the findings represent atelectasis.   Electronically Signed   By: Rozetta Nunnery M.D.   On: 11/24/2013 13:46     Microbiology: Recent Results (from the past 240 hour(s))  URINE CULTURE     Status: None   Collection Time    11/24/13  2:47 PM      Result Value Ref Range Status   Specimen Description URINE, CATHETERIZED   Final   Special Requests NONE   Final   Culture  Setup Time     Final   Value: 11/24/2013 21:03     Performed at [place]     Final   Value: NO GROWTH     Performed at [facility]   Culture     Final   Value: NO GROWTH     Performed at [facility]   Report Status 11/26/2013 FINAL   Final  MRSA PCR SCREENING     Status: None   Collection Time    11/24/13  9:51 PM      Result Value Ref Range Status   MRSA by PCR NEGATIVE  NEGATIVE Final   Comment:            The GeneXpert MRSA Assay (FDA     approved for NASAL specimens     only), is one component of a     comprehensive MRSA colonization     surveillance program. It is not     intended to diagnose MRSA     infection nor to guide or     monitor treatment for     MRSA infections.     Labs: Basic Metabolic Panel:  Recent Labs Lab 11/24/13 1312 11/24/13 1812 11/25/13 0443 11/26/13 0550  NA 138  --  135* 131*  K 4.1  --  4.8 4.7  CL 100  --  96 92*  CO2 22  --  26 23  GLUCOSE 159*  --  89 82  BUN 18  --  12 12  CREATININE 0.87  --  0.85 0.91  CALCIUM 9.6  --  9.3 9.7  MG  --  2.1  --   --    Liver Function Tests:  Recent Labs Lab 11/25/13 0443  AST 26  ALT 6  ALKPHOS 92  BILITOT 0.3  PROT 6.9  ALBUMIN 3.2*   No results found for this basename: LIPASE, AMYLASE,  in the last 168 hours No results found for this basename: AMMONIA,  in the last 168 hours CBC:  Recent Labs Lab 11/24/13 1312 11/25/13 0443 11/26/13 0550  WBC 14.5* 13.6* 11.7*  NEUTROABS 7.9*  --   --   HGB 11.7* 10.6* 12.4  HCT 36.2 31.2* 36.7  MCV 91.4 89.4 91.8  PLT 297 230 221   Cardiac Enzymes: No results found for this basename: CKTOTAL, CKMB, CKMBINDEX, TROPONINI,  in the last 168 hours BNP: No components found with this basename: POCBNP,  CBG:  Recent Labs  Lab 11/25/13 2247  GLUCAP 104*    Time coordinating discharge:  Greater than 30 minutes  Signed:  Lowella Kindley, DO [facility] Pager: [phone] 11/26/2013, 8:26 AM

## 2014-01-27 ENCOUNTER — Emergency Department (HOSPITAL_BASED_OUTPATIENT_CLINIC_OR_DEPARTMENT_OTHER): Payer: Medicare Other

## 2014-01-27 ENCOUNTER — Emergency Department (HOSPITAL_BASED_OUTPATIENT_CLINIC_OR_DEPARTMENT_OTHER)
Admission: EM | Admit: 2014-01-27 | Discharge: 2014-01-27 | Disposition: A | Discharge status: Home / Self Care | Payer: Medicare Other | Attending: Emergency Medicine | Admitting: Emergency Medicine

## 2014-01-27 ENCOUNTER — Encounter (HOSPITAL_BASED_OUTPATIENT_CLINIC_OR_DEPARTMENT_OTHER): Payer: Self-pay | Admitting: *Deleted

## 2014-01-27 DIAGNOSIS — M436 Torticollis: Secondary | ICD-10-CM | POA: Insufficient documentation

## 2014-01-27 DIAGNOSIS — G43909 Migraine, unspecified, not intractable, without status migrainosus: Secondary | ICD-10-CM | POA: Insufficient documentation

## 2014-01-27 DIAGNOSIS — F419 Anxiety disorder, unspecified: Secondary | ICD-10-CM | POA: Insufficient documentation

## 2014-01-27 DIAGNOSIS — Z7982 Long term (current) use of aspirin: Secondary | ICD-10-CM | POA: Diagnosis not present

## 2014-01-27 DIAGNOSIS — G40909 Epilepsy, unspecified, not intractable, without status epilepticus: Secondary | ICD-10-CM | POA: Diagnosis not present

## 2014-01-27 DIAGNOSIS — K8020 Calculus of gallbladder without cholecystitis without obstruction: Secondary | ICD-10-CM

## 2014-01-27 DIAGNOSIS — Z79899 Other long term (current) drug therapy: Secondary | ICD-10-CM | POA: Insufficient documentation

## 2014-01-27 DIAGNOSIS — F840 Autistic disorder: Secondary | ICD-10-CM | POA: Insufficient documentation

## 2014-01-27 DIAGNOSIS — F71 Moderate intellectual disabilities: Secondary | ICD-10-CM | POA: Diagnosis not present

## 2014-01-27 DIAGNOSIS — R1011 Right upper quadrant pain: Secondary | ICD-10-CM | POA: Diagnosis present

## 2014-01-27 DIAGNOSIS — R1010 Upper abdominal pain, unspecified: Secondary | ICD-10-CM

## 2014-01-27 LAB — TROPONIN I: Troponin I: <0.3 ng/mL (ref <0.30)

## 2014-01-27 LAB — CBC WITH DIFFERENTIAL/PLATELET
BASOS ABS: 0 10*3/uL (ref 0.0–0.1)
Basophils Relative: 0 % (ref 0–1)
Eosinophils Absolute: 0 10*3/uL (ref 0.0–0.7)
Eosinophils Relative: 0 % (ref 0–5)
HEMATOCRIT: 32.4 % — AB (ref 36.0–46.0)
Hemoglobin: 10.3 g/dL — ABNORMAL LOW (ref 12.0–15.0)
LYMPHS ABS: 2 10*3/uL (ref 0.7–4.0)
LYMPHS PCT: 15 % (ref 12–46)
MCH: 29.1 pg (ref 26.0–34.0)
MCHC: 31.8 g/dL (ref 30.0–36.0)
MCV: 91.5 fL (ref 78.0–100.0)
MONO ABS: 1.3 10*3/uL — AB (ref 0.1–1.0)
Monocytes Relative: 10 % (ref 3–12)
NEUTROS ABS: 9.7 10*3/uL — AB (ref 1.7–7.7)
Neutrophils Relative %: 75 % (ref 43–77)
Platelets: 297 10*3/uL (ref 150–400)
RBC: 3.54 MIL/uL — AB (ref 3.87–5.11)
RDW: 14.6 % (ref 11.5–15.5)
WBC: 13.1 10*3/uL — AB (ref 4.0–10.5)

## 2014-01-27 LAB — URINALYSIS, ROUTINE W REFLEX MICROSCOPIC
Bilirubin Urine: NEGATIVE
Glucose, UA: NEGATIVE mg/dL
Ketones, ur: 15 mg/dL — AB
LEUKOCYTES UA: NEGATIVE
Nitrite: NEGATIVE
PH: 7 (ref 5.0–8.0)
Protein, ur: NEGATIVE mg/dL
SPECIFIC GRAVITY, URINE: 1.02 (ref 1.005–1.030)
Urobilinogen, UA: 0.2 mg/dL (ref 0.0–1.0)

## 2014-01-27 LAB — COMPREHENSIVE METABOLIC PANEL
ALT: 5 U/L (ref 0–35)
AST: 21 U/L (ref 0–37)
Albumin: 3.5 g/dL (ref 3.5–5.2)
Alkaline Phosphatase: 113 U/L (ref 39–117)
Anion gap: 15 (ref 5–15)
BILIRUBIN TOTAL: 0.3 mg/dL (ref 0.3–1.2)
BUN: 16 mg/dL (ref 6–23)
CHLORIDE: 94 meq/L — AB (ref 96–112)
CO2: 26 meq/L (ref 19–32)
CREATININE: 0.9 mg/dL (ref 0.50–1.10)
Calcium: 10 mg/dL (ref 8.4–10.5)
GFR calc Af Amer: 80 mL/min — ABNORMAL LOW (ref >90)
GFR, EST NON AFRICAN AMERICAN: 69 mL/min — AB (ref >90)
Glucose, Bld: 110 mg/dL — ABNORMAL HIGH (ref 70–99)
Potassium: 4.7 mEq/L (ref 3.7–5.3)
Sodium: 135 mEq/L — ABNORMAL LOW (ref 137–147)
Total Protein: 8.5 g/dL — ABNORMAL HIGH (ref 6.0–8.3)

## 2014-01-27 LAB — URINE MICROSCOPIC-ADD ON

## 2014-01-27 LAB — LIPASE, BLOOD: LIPASE: 21 U/L (ref 11–59)

## 2014-01-27 MED ORDER — ONDANSETRON HCL 4 MG PO TABS: 4.0000 mg | ORAL_TABLET | Freq: Four times a day (QID) | ORAL | Status: DC

## 2014-01-27 MED ORDER — OXYCODONE-ACETAMINOPHEN 5-325 MG PO TABS: 1.0000 | ORAL_TABLET | Freq: Four times a day (QID) | ORAL | Status: DC | PRN

## 2014-01-27 NOTE — Discharge Instructions (Signed)
Please follow up closely with [facility] next week for further care.  If your condition worsen please return.    Cholelithiasis Cholelithiasis (also called gallstones) is a form of gallbladder disease in which gallstones form in your gallbladder. The gallbladder is an organ that stores bile made in the liver, which helps digest fats. Gallstones begin as small crystals and slowly grow into stones. Gallstone pain occurs when the gallbladder spasms and a gallstone is blocking the duct. Pain can also occur when a stone passes out of the duct.  RISK FACTORS  Being female.   Having multiple pregnancies. Health care providers sometimes advise removing diseased gallbladders before future pregnancies.   Being obese.  Eating a diet heavy in fried foods and fat.   Being older than 48 years and increasing age.   Prolonged use of medicines containing female hormones.   Having diabetes mellitus.   Rapidly losing weight.   Having a family history of gallstones (heredity).  SYMPTOMS  Nausea.   Vomiting.  Abdominal pain.   Yellowing of the skin (jaundice).   Sudden pain. It may persist from several minutes to several hours.  Fever.   Tenderness to the touch. In some cases, when gallstones do not move into the bile duct, people have no pain or symptoms. These are called "silent" gallstones.  TREATMENT Silent gallstones do not need treatment. In severe cases, emergency surgery may be required. Options for treatment include:  Surgery to remove the gallbladder. This is the most common treatment.  Medicines. These do not always work and may take 6-12 months or more to work.  Shock wave treatment (extracorporeal biliary lithotripsy). In this treatment an ultrasound machine sends shock waves to the gallbladder to break gallstones into smaller pieces that can pass into the intestines or be dissolved by medicine. HOME CARE INSTRUCTIONS   Only take over-the-counter or  prescription medicines for pain, discomfort, or fever as directed by your health care provider.   Follow a low-fat diet until seen again by your health care provider. Fat causes the gallbladder to contract, which can result in pain.   Follow up with your health care provider as directed. Attacks are almost always recurrent and surgery is usually required for permanent treatment.  SEEK IMMEDIATE MEDICAL CARE IF:   Your pain increases and is not controlled by medicines.   You have a fever or persistent symptoms for more than 2-3 days.   You have a fever and your symptoms suddenly get worse.   You have persistent nausea and vomiting.  MAKE SURE YOU:   Understand these instructions.  Will watch your condition.  Will get help right away if you are not doing well or get worse. Document Released: 02/20/2005 Document Revised: 10/27/2012 Document Reviewed: 08/18/2012 [facility] Patient Information 2015 [place], [place]. This information is not intended to replace advice given to you by your health care provider. Make sure you discuss any questions you have with your health care provider.

## 2014-01-27 NOTE — ED Notes (Signed)
Pt. Is from assisted living with care giver at her side in triage Ms. Sydney Hatfield.  Pt. Is Autistic with c/o R upper quadrant Hatfield and was seen by PMD today and told to come to ED due to poss. Elevated WBC count of 11.9 and poss. Gal stones.    Pt. Has no vomiting noted and no c/o nausea in triage.

## 2014-01-27 NOTE — ED Provider Notes (Signed)
Medical screening examination/treatment/procedure(s) were conducted as a shared visit with non-physician practitioner(s) and myself.  I personally evaluated the patient during the encounter.  One week intermittent pain right upper quadrant last several hours at a time LFTs and lipase normal currently pain-free with abdomen soft nontender follow-up with general surgery appears reasonable.   Babette Relic, MD 02/02/14 [phone]

## 2014-01-27 NOTE — ED Provider Notes (Signed)
CSN: [id number]     Arrival date & time 01/27/14  1421 History   First MD Initiated Contact with Patient 01/27/14 1652     Chief Complaint  Patient presents with  . Abdominal Pain     (Consider location/radiation/quality/duration/timing/severity/associated sxs/prior Treatment) HPI   58 year old female with history of mental retardation, autistic, anxiety disorder was brought here from an assisted living accompanied by a care giver for evaluation of abdominal pain. History obtained through patient and through caregiver who is at bedside. Patient reports having recurrent right upper quadrant abdominal pain ongoing for the past week. Pain is sharp, nonradiating, waxing and waning, lasting for several hours, and colicky in nature. She is currently having active pain. Caregiver mentioned that patient is currently eating less than usual. Patient however denies having vomiting or diarrhea. Caregiver did give patient milk of magnesia this morning when patient was complaining of severe pain. Patient did produce a small bowel movement with loose stools but no blood or mucus. No compressive fever, chills, chest pain, shortness of breath, productive cough, hemoptysis, dysuria. Patient denies any change in her eating habits. She was initially seen by her PCP week ago when she was complaining of leg cramping. She had a follow-up appointment sometime in December. She was also seen earlier today for her abdominal pain by her PCP who recommended patient to follow-up in the ER for an abdominal ultrasound to rule out gallbladder etiology. Patient denies any prior history of abdominal surgery. At this time patient does not want any pain medication. Caregiver mentioned that patient has history of torticollis and has had aspiration pneumonia in the past. As mentioned earlier patient currently denies having chest pain, short of breath or productive cough.  Past Medical History  Diagnosis Date  . Autistic spectrum disorder    . Anxiety disorder   . Seizure   . MR (mental retardation), moderate   . Torticollis   . Hx of migraines    Past Surgical History  Procedure Laterality Date  . Colonoscopy    . Breast biopsy     Family History  Problem Relation Age of Onset  . Hypertension Mother   . Hypertension Father   . CAD Father    History  Substance Use Topics  . Smoking status: Never Smoker   . Smokeless tobacco: Never Used  . Alcohol Use: No   OB History    No data available     Review of Systems  All other systems reviewed and are negative.     Allergies  Carbamazepine; Sulfa antibiotics; and Tessalon  Home Medications   Prior to Admission medications   Medication Sig Start Date End Date Taking? Authorizing Provider  amoxicillin-clavulanate (AUGMENTIN) 875-125 MG per tablet Take 1 tablet by mouth every 12 (twelve) hours. 11/26/13   Orson Eva, MD  aspirin EC 81 MG tablet Take 81 mg by mouth daily.    Historical Provider, MD  clindamycin (CLEOCIN) 300 MG capsule Take 1 capsule (300 mg total) by mouth every 8 (eight) hours. 11/26/13   Orson Eva, MD  clonazePAM (KLONOPIN) 0.5 MG tablet Take 0.5-1 mg by mouth 2 (two) times daily as needed. For anxiety    Historical Provider, MD  divalproex (DEPAKOTE) 250 MG DR tablet Take 1,000 mg by mouth at bedtime.    Historical Provider, MD  esomeprazole (NEXIUM) 40 MG capsule Take 40 mg by mouth daily before breakfast.    Historical Provider, MD  feeding supplement (ENSURE) PUDG Take 1 Container by mouth 3 (  three) times daily between meals. Vanilla or chocolate 04/04/12   Orson Eva, MD  ferrous sulfate 325 (65 FE) MG tablet Take 650 mg by mouth daily with breakfast.    Historical Provider, MD  nitrofurantoin (MACRODANTIN) 50 MG capsule Take 50 mg by mouth every evening.     Historical Provider, MD  Prenatal Vit-Fe Fumarate-FA (PRENATAL MULTIVITAMIN) TABS tablet Take 1 tablet by mouth daily at 12 noon.    Historical Provider, MD  ranitidine (ZANTAC) 150 MG  tablet Take 150 mg by mouth every evening.    Historical Provider, MD  sertraline (ZOLOFT) 100 MG tablet Take 200 mg by mouth daily.    Historical Provider, MD  simvastatin (ZOCOR) 40 MG tablet Take 40 mg by mouth every evening.    Historical Provider, MD   BP 138/81 mmHg  Pulse 90  Temp(Src) 98.7 F (37.1 C) (Oral)  Resp 20  Ht 5\' 1"  (1.549 m)  Wt 129 lb (58.514 kg)  BMI 24.39 kg/m2  SpO2 95% Physical Exam  Constitutional: She is oriented to person, place, and time. She appears well-developed and well-nourished. No distress.  Neck:  Torticollis neck favoring left side.  Cardiovascular: Normal rate, regular rhythm and intact distal pulses.   Pulmonary/Chest: Effort normal and breath sounds normal.  Poor effort but no obvious wheezes rales or rhonchi heard  Abdominal: Soft. Bowel sounds are normal. She exhibits no distension. There is tenderness (tenderness to right upper quadrant on palpation without guarding rebound tenderness. No abdominal distention, no peritoneal sign, no overlying skin changes.).  Musculoskeletal: She exhibits no edema.  Neurological: She is alert and oriented to person, place, and time.  Nursing note and vitals reviewed.   ED Course  Procedures (including critical care time)  5:37 PM This is a high functioning autistic patient who presents complaining of right upper quadrant abdominal pain and was sent here by PCP for an abdominal ultrasound to rule out gallbladder etiology. Patient does have an elevated white count of 13.1 without left shift. UA shows large hemoglobin and urine dipstick but no evidence of urinary tract infection. She has evidence of blood in urine from multiple prior UA. She denies dysuria.  She has no CVA tenderness to suggest kidney stone. Given the location for pain, will obtain chest x-ray and EKG to further assess for ACS although it is less likely given the duration of her symptoms.    8:12 PM Chest x-ray shows no evidence of pneumonia.  Abdominal ultrasound demonstrated distended gallbladder filled with debris and sludge and likely some small gallstone causing area of shadowing. The gallbladder wall is thickened but there is no surrounding ascites. Patient has normal liver enzyme and normal lipase. Her pain is well-controlled. We have consult and [facility], Dr. Delana Meyer who recommend f/u next week for further care.  Pt understand to return if condition worsen.  Care discussed with Dr. Stevie Kern.  Labs Review Labs Reviewed  URINALYSIS, ROUTINE W REFLEX MICROSCOPIC - Abnormal; Notable for the following:    Hgb urine dipstick LARGE (*)    Ketones, ur 15 (*)    All other components within normal limits  CBC WITH DIFFERENTIAL - Abnormal; Notable for the following:    WBC 13.1 (*)    RBC 3.54 (*)    Hemoglobin 10.3 (*)    HCT 32.4 (*)    Neutro Abs 9.7 (*)    Monocytes Absolute 1.3 (*)    All other components within normal limits  COMPREHENSIVE METABOLIC PANEL - Abnormal;  Notable for the following:    Sodium 135 (*)    Chloride 94 (*)    Glucose, Bld 110 (*)    Total Protein 8.5 (*)    GFR calc non Af Amer 69 (*)    GFR calc Af Amer 80 (*)    All other components within normal limits  LIPASE, BLOOD  URINE MICROSCOPIC-ADD ON  TROPONIN I    Imaging Review Dg Chest 2 View  01/27/2014   CLINICAL DATA:  Right upper quadrant abdominal pain. Elevated white blood cell count.  EXAM: CHEST  2 VIEW  COMPARISON:  11/25/2013; 11/24/2013  FINDINGS: Grossly unchanged cardiac silhouette and mediastinal contours. Overall improved aeration of the lungs with persistent bibasilar linear heterogeneous opacities, left greater than right, likely atelectasis. No new focal airspace opacities. No pleural effusion or pneumothorax, though note, evaluation of lung apices is degraded secondary to patient's overlying chin. No evidence of edema. No acute osseus abnormalities.  IMPRESSION: 1.  No acute cardiopulmonary disease. 2. Improved  aeration of lungs with persistent bibasilar linear opacities favored to represent atelectasis or scar.   Electronically Signed   By: Sandi Mariscal M.D.   On: 01/27/2014 18:14   US Abdomen Complete  01/27/2014   CLINICAL DATA:  Epigastric and right upper quadrant abdominal pain.  EXAM: ULTRASOUND ABDOMEN COMPLETE  COMPARISON:  04/03/2012  FINDINGS: Gallbladder: Abnormal wall thickening at 4 mm, distended with complex debris of variable echogenicity. Some of this probably shadows favoring some gallstones superimposed in extensive sludge. On Doppler imaging of the gallbladder we did not show internal flow within the lumen to suggest a luminal mass. Gallbladder measured at 11.3 by 5.6 by 5.8 cm. Sonographic Murphy's sign absent.  Common bile duct: Diameter: 5 mm  Liver: There is some heterogeneity in the left hepatic lobe. A hypodense lesion has been noted in this vicinity on prior exams, and could be from fatty infiltration. The hypodense lesion was relatively stable between 2011 and 2014, suggesting a benign etiology.  IVC: Not well seen due to overlying bowel gas.  Pancreas: Visualized portion unremarkable.  Spleen: Size and appearance within normal limits.  Right Kidney: Length: 8.9 cm. Echogenicity within normal limits. No mass or hydronephrosis visualized.  Left Kidney: Length: 9.3 cm. Echogenicity within normal limits. No mass or hydronephrosis visualized.  Abdominal aorta: Atherosclerotic plaque observed.  No aneurysm seen.  Other findings: None.  IMPRESSION: 1. Distended gallbladder is filled with debris/sludge and likely some small gallstones causing areas of shadowing for example on image 92. Sonographic Murphy's sign was absent. The gallbladder wall is thickened but there is no surrounding ascites. Correlate clinically in assessing for cholecystitis. 2. Heterogeneity in the left hepatic lobe probably from benign fatty infiltration. A lesion seen in this vicinity on prior CT scans was noted to be  chronically stable and accordingly probably benign.   Electronically Signed   By: Sherryl Barters M.D.   On: 01/27/2014 18:59     EKG Interpretation None      MDM   Final diagnoses:  Upper abdominal pain  Calculus of gallbladder without cholecystitis without obstruction    BP 105/59 mmHg  Pulse 89  Temp(Src) 98.9 F (37.2 C) (Oral)  Resp 16  Ht 5\' 1"  (1.549 m)  Wt 129 lb (58.514 kg)  BMI 24.39 kg/m2  SpO2 96%  I have reviewed nursing notes and vital signs. I personally reviewed the imaging tests through PACS system  I reviewed available ER/hospitalization records thought the EMR  Domenic Moras, PA-C 01/27/14 2014

## 2014-02-09 ENCOUNTER — Telehealth (INDEPENDENT_AMBULATORY_CARE_PROVIDER_SITE_OTHER): Payer: Self-pay

## 2014-02-09 DIAGNOSIS — R1011 Right upper quadrant pain: Secondary | ICD-10-CM

## 2014-02-09 NOTE — Telephone Encounter (Signed)
Pt seen in office today by Dr Rosendo Gros and orders placed in epic for pt to get scheduled for Hida scan with ej fraction.

## 2014-02-21 ENCOUNTER — Ambulatory Visit (HOSPITAL_COMMUNITY)
Admission: RE | Admit: 2014-02-21 | Discharge: 2014-02-21 | Disposition: A | Discharge status: Home / Self Care | Payer: Medicare Other | Source: Ambulatory Visit | Attending: General Surgery | Admitting: General Surgery

## 2014-02-21 DIAGNOSIS — R1011 Right upper quadrant pain: Secondary | ICD-10-CM | POA: Insufficient documentation

## 2014-02-21 DIAGNOSIS — R932 Abnormal findings on diagnostic imaging of liver and biliary tract: Secondary | ICD-10-CM | POA: Insufficient documentation

## 2014-02-21 MED ORDER — MORPHINE SULFATE 4 MG/ML IJ SOLN
2.2000 mg | Freq: Once | INTRAMUSCULAR | Status: AC
Start: 2014-02-21 — End: 2014-02-21
  Administered 2014-02-21: 2.2 mg via INTRAVENOUS

## 2014-02-21 MED ORDER — TECHNETIUM TC 99M MEBROFENIN IV KIT
5.0000 | PACK | Freq: Once | INTRAVENOUS | Status: AC | PRN
  Administered 2014-02-21: 5 via INTRAVENOUS

## 2014-02-21 MED ORDER — MORPHINE SULFATE 4 MG/ML IJ SOLN
INTRAMUSCULAR | Status: AC
  Filled 2014-02-21: qty 1

## 2014-03-16 ENCOUNTER — Ambulatory Visit (INDEPENDENT_AMBULATORY_CARE_PROVIDER_SITE_OTHER): Payer: Self-pay | Admitting: General Surgery

## 2014-03-16 NOTE — H&P (Signed)
History of Present Illness Sydney Ok MD; 02/09/2014 11:06 AM) Patient words: possible gallstones.  The patient is a 59 year old female who presents with abdominal pain. The patient is a 59 year old female who is referred from [facility] ER. The patient has a history of MR and autism. Patient comes in with abdominal pain. She had an ultrasound which revealed some sludge possible stones. According her caretaker she does have some right upper quadrant pain at nighttime minutes not following meals. The patient's caretaker states that she is mainly on a bland diet at this time and has had minimal by mouth intake. Patient has high anxiety issues. The patient states that a nursing home   Other Problems Briant Cedar, CMA; 02/09/2014 10:31 AM) Anxiety Disorder Chest pain Cholelithiasis Gastroesophageal Reflux Disease Migraine Headache Seizure Disorder  Past Surgical History Briant Cedar, [place]; 02/09/2014 10:31 AM) No pertinent past surgical history  Diagnostic Studies History Briant Cedar, [place]; 02/09/2014 10:31 AM) Colonoscopy 5-10 years ago Mammogram within last year Pap Smear >5 years ago  Allergies Briant Cedar, [place]; 02/09/2014 10:33 AM) [facility] CarBAMazepine *ANTICONVULSANTS* Tessalon *COUGH/COLD/ALLERGY*  Medication History Briant Cedar, CMA; 02/09/2014 10:33 AM) Oxycodone-Acetaminophen (5-325MG  Tablet, Oral) Active. ClonazePAM (0.5MG  Tablet, Oral) Active. Amoxicillin-Pot Clavulanate (875-125MG  Tablet, Oral) Active. Clindamycin HCl (300MG  Capsule, Oral) Active. Divalproex Sodium ER (250MG  Tablet ER 24HR, Oral) Active. Ferrous Sulfate (325 (65 Fe)MG Tablet, Oral) Active. NexIUM (40MG  Capsule DR, Oral) Active. Nitrofurantoin Macrocrystal (50MG  Capsule, Oral) Active. Ondansetron HCl (4MG  Tablet, Oral) Active. PNV Prenatal Plus Multivitamin (27-1MG  Tablet, Oral) Active. Ranitidine HCl (150MG  Tablet, Oral) Active. Sertraline HCl  (100MG  Tablet, Oral) Active. Simvastatin (40MG  Tablet, Oral) Active.  Social History Briant Cedar, [place]; 02/09/2014 10:31 AM) Caffeine use Coffee. No alcohol use No drug use Tobacco use Never smoker.  Family History Briant Cedar, [place]; 02/09/2014 10:31 AM) Arthritis Father. Hypertension Father.  Pregnancy / Birth History Briant Cedar, CMA; 02/09/2014 10:31 AM) Age of menopause 51-60 Contraceptive History Oral contraceptives. Gravida 0 Irregular periods Para 0  Review of Systems Briant Cedar CMA; 02/09/2014 10:31 AM) General Present- Appetite Loss and Weight Loss. Not Present- Chills, Fatigue, Fever, Night Sweats and Weight Gain. Skin Not Present- Change in Wart/Mole, Dryness, Hives, Jaundice, New Lesions, Non-Healing Wounds, Rash and Ulcer. HEENT Not Present- Earache, Hearing Loss, Hoarseness, Nose Bleed, Oral Ulcers, Ringing in the Ears, Seasonal Allergies, Sinus Pain, Sore Throat, Visual Disturbances, Wears glasses/contact lenses and Yellow Eyes. Respiratory Not Present- Bloody sputum, Chronic Cough, Difficulty Breathing, Snoring and Wheezing. Breast Not Present- Breast Mass, Breast Pain, Nipple Discharge and Skin Changes. Cardiovascular Present- Leg Cramps. Not Present- Chest Pain, Difficulty Breathing Lying Down, Palpitations, Rapid Heart Rate, Shortness of Breath and Swelling of Extremities. Gastrointestinal Present- Abdominal Pain. Not Present- Bloating, Bloody Stool, Change in Bowel Habits, Chronic diarrhea, Constipation, Difficulty Swallowing, Excessive gas, Gets full quickly at meals, Hemorrhoids, Indigestion, Nausea, Rectal Pain and Vomiting. Female Genitourinary Present- Pelvic Pain. Not Present- Frequency, Nocturia, Painful Urination and Urgency. Neurological Present- Headaches and Weakness. Not Present- Decreased Memory, Fainting, Numbness, Seizures, Tingling, Tremor and Trouble walking. Psychiatric Present- Anxiety and Frequent crying. Not Present-  Bipolar, Change in Sleep Pattern, Depression and Fearful. Endocrine Not Present- Cold Intolerance, Excessive Hunger, Hair Changes, Heat Intolerance, Hot flashes and New Diabetes. Hematology Not Present- Easy Bruising, Excessive bleeding, Gland problems, HIV and Persistent Infections.   Vitals Briant Cedar CMA; 02/09/2014 10:32 AM) 02/09/2014 10:32 AM Weight: 125.38 lb Height: 62in Body Surface Area: 1.58 m Body Mass Index: 22.93 kg/m Temp.: 98.24F  Pulse: 83 (Regular)  BP: 130/79 (Sitting, Left Arm, Standard)    Physical Exam Sydney Ok MD; 02/09/2014 11:06 AM) General Mental Status-Alert. General Appearance-Consistent with stated age. Hydration-Well hydrated. Voice-Normal.  Head and Neck Head-normocephalic, atraumatic with no lesions or palpable masses. Trachea-midline. Thyroid Gland Characteristics - normal size and consistency.  Chest and Lung Exam Chest and lung exam reveals -quiet, even and easy respiratory effort with no use of accessory muscles and on auscultation, normal breath sounds, no adventitious sounds and normal vocal resonance. Inspection Chest Wall - Normal. Back - normal.  Abdomen Inspection Inspection of the abdomen reveals - No Hernias. Skin - Scar - no surgical scars. Palpation/Percussion Palpation and Percussion of the abdomen reveal - Soft, Non Tender, No Rebound tenderness, No Rigidity (guarding) and No hepatosplenomegaly. Auscultation Auscultation of the abdomen reveals - Bowel sounds normal.  Musculoskeletal Normal Exam - Left-Upper Extremity Strength Normal and Lower Extremity Strength Normal. Normal Exam - Right-Upper Extremity Strength Normal and Lower Extremity Strength Normal.    Assessment & Plan Sydney Ok MD; 02/09/2014 11:09 AM) ABDOMINAL PAIN, UNSPECIFIED ABDOMINAL LOCATION (789.00  R10.9) Impression: 59 year old female with abdominal pain.  Per HIDA scan no visualization of the  gallbladder. This can be due to possible cholecystitis.  We will proceed to the operating room for a laparoscopic cholecystectomy.  All risks and benefits were discussed with the patient to generally include: infection, bleeding, possible need for post op ERCP, damage to the bile ducts, and bile leak. Alternatives were offered and described.  All questions were answered and the patient voiced understanding of the procedure and wishes to proceed at this point with a laparoscopic cholecystectomy

## 2014-03-30 ENCOUNTER — Ambulatory Visit (INDEPENDENT_AMBULATORY_CARE_PROVIDER_SITE_OTHER): Payer: Self-pay | Admitting: General Surgery

## 2014-03-30 NOTE — H&P (Signed)
History of Present Illness [name] MD; 03/30/2014 11:39 AM) Patient words: discuss test results along with surgery.  The patient is a 59 year old female who presents with abdominal pain. The patient is a 59 year old female who is referred from [facility] ER. The patient has a history of MR and autism. Patient was have a history of abdominal pain with meals. Patient has had a decreased appetite. Patient has had a HIDA scan which revealed no visualization of the gallbladder. Patient does have a caretaker who we will schedule the surgery with. The patient's mom will be there has been surgery and we will discussed the risks and benefits of the procedure then.   Medication History Lars Mage [place], [place]; 03/30/2014 11:05 AM) Medications Reconciled  Review of Systems [name], MD; 03/30/2014 11:38 00) General Present- Appetite Loss and Weight Loss. Not Present- Chills, Fatigue, Fever, Night Sweats and Weight Gain. Skin Not Present- Change in Wart/Mole, Dryness, Hives, Jaundice, New Lesions, Non-Healing Wounds, Rash and Ulcer. HEENT Not Present- Earache, Hearing Loss, Hoarseness, Nose Bleed, Oral Ulcers, Ringing in the Ears, Seasonal Allergies, Sinus Pain, Sore Throat, Visual Disturbances, Wears glasses/contact lenses and Yellow Eyes. Respiratory Not Present- Bloody sputum, Chronic Cough, Difficulty Breathing, Snoring and Wheezing. Breast Not Present- Breast Mass, Breast Pain, Nipple Discharge and Skin Changes. Cardiovascular Present- Leg Cramps. Not Present- Chest Pain, Difficulty Breathing Lying Down, Palpitations, Rapid Heart Rate, Shortness of Breath and Swelling of Extremities. Gastrointestinal Present- Abdominal Pain. Not Present- Bloating, Bloody Stool, Change in Bowel Habits, Chronic diarrhea, Constipation, Difficulty Swallowing, Excessive gas, Gets full quickly at meals, Hemorrhoids, Indigestion, Nausea, Rectal Pain and Vomiting. Female Genitourinary Present- Pelvic Pain. Not  Present- Frequency, Nocturia, Painful Urination and Urgency. Neurological Present- Headaches and Weakness. Not Present- Decreased Memory, Fainting, Numbness, Seizures, Tingling, Tremor and Trouble walking. Psychiatric Present- Anxiety and Frequent crying. Not Present- Bipolar, Change in Sleep Pattern, Depression and Fearful. Endocrine Not Present- Cold Intolerance, Excessive Hunger, Hair Changes, Heat Intolerance, Hot flashes and New Diabetes. Hematology Not Present- Easy Bruising, Excessive bleeding, Gland problems, HIV and Persistent Infections.   Vitals (Alisha Spillers MA; 03/30/2014 11:05 AM) 03/30/2014 11:04 AM Weight: 125 lb Height: 62in Body Surface Area: 1.57 m Body Mass Index: 22.86 kg/m Pulse: 92 (Regular)  BP: 104/70 (Sitting, Left Arm, Standard)    Physical Exam [name], MD; 03/30/2014 11:38 00) General Mental Status-Alert. General Appearance-Consistent with stated age. Hydration-Well hydrated. Voice-Normal.  Head and Neck Head-normocephalic, atraumatic with no lesions or palpable masses. Trachea-midline. Thyroid Gland Characteristics - normal size and consistency.  Chest and Lung Exam Chest and lung exam reveals -quiet, even and easy respiratory effort with no use of accessory muscles and on auscultation, normal breath sounds, no adventitious sounds and normal vocal resonance. Inspection Chest Wall - Normal. Back - normal.  Abdomen Inspection Normal Exam - No Hernias. Skin - Scar - no surgical scars. Palpation/Percussion Normal exam - Soft, Non Tender, No Rebound tenderness, No Rigidity (guarding) and No hepatosplenomegaly. Auscultation Normal exam - Bowel sounds normal.  Musculoskeletal Normal Exam - Left-Upper Extremity Strength Normal and Lower Extremity Strength Normal. Normal Exam - Right-Upper Extremity Strength Normal and Lower Extremity Strength Normal.    Assessment & Plan [name] MD; 03/30/2014 11:40  AM) ABDOMINAL PAIN, UNSPECIFIED ABDOMINAL LOCATION (789.00  R10.9) Impression: 59 year old female with abdominal pain.  It does appear the patient does have either chronic cholecystitis or some type of biliary dyskinesia which is causing her abdominal pain and possible decreased appetite. We'll proceed to the  operating for a laparoscopic cholecystectomy.  I will discussed the risks and benefits with the patient's mother the surgery if she is not here today on clinic visit. The patient presents today with a caretaker.

## 2014-05-01 NOTE — Pre-Procedure Instructions (Addendum)
WAVE CALZADA  05/01/2014   Your procedure is scheduled on:  Wednesday, March 2.  Report to [facility] Admitting a t9:30 AM.  Call this number if you have problems the morning of surgery: [phone]              For any other questions, please call [phone], Monday - Friday 8 AM - 4 PM.    Remember:   Do not eat food or drink liquids after midnight Tuesday, March 1.   Take these medicines the morning of surgery with A SIP OF WATER: clonazePAM (KLONOPIN), esomeprazole (NEXIUM), sertraline (ZOLOFT).              Take if needed:oxyCODONE-acetaminophen (PERCOCET/ROXICET), ondansetron (ZOFRAN).               Stop taking Vitamins.    Do not wear jewelry, make-up or nail polish.  Do not wear lotions, powders, or perfumes.   Do not shave 48 hours prior to surgery.  Do not bring valuables to the hospital.             [facility] is not responsible for any belongings or valuables.               Contacts, dentures or bridgework may not be worn into surgery.  Leave suitcase in the car. After surgery it may be brought to your room.  For patients admitted to the hospital, discharge time is determined by your treatment team.               Patients discharged the day of surgery will not be allowed to drive home.     Special Instructions: [place] - Preparing for Surgery  Before surgery, you can play an important role.  Because skin is not sterile, your skin needs to be as free of germs as possible.  You can reduce the number of germs on you skin by washing with CHG (chlorahexidine gluconate) soap before surgery.  CHG is an antiseptic cleaner which kills germs and bonds with the skin to continue killing germs even after washing.  Please DO NOT use if you have an allergy to CHG or antibacterial soaps.  If your skin becomes reddened/irritated stop using the CHG and inform your nurse when you arrive at Short Stay.  Do not shave (including legs and underarms) for at least 48  hours prior to the first CHG shower.  You may shave your face.  Please follow these instructions carefully:   1.  Shower with CHG Soap the night before surgery and the                                morning of Surgery.  2.  If you choose to wash your hair, wash your hair first as usual with your       normal shampoo.  3.  After you shampoo, rinse your hair and body thoroughly to remove the                      Shampoo.  4.  Use CHG as you would any other liquid soap.  You can apply chg directly       to the skin and wash gently with scrungie or a clean washcloth.  5.  Apply the CHG Soap to your body ONLY FROM THE NECK DOWN.        Do not use  on open wounds or open sores.  Avoid contact with your eyes,       ears, mouth and genitals (private parts).  Wash genitals (private parts)       with your normal soap.  6.  Wash thoroughly, paying special attention to the area where your surgery        will be performed.  7.  Thoroughly rinse your body with warm water from the neck down.  8.  DO NOT shower/wash with your normal soap after using and rinsing off       the CHG Soap.  9.  Pat yourself dry with a clean towel.            10.  Wear clean pajamas.            11.  Place clean sheets on your bed the night of your first shower and do not        sleep with pets.  Day of Surgery  Do not apply any lotions/deoderants the morning of surgery.  Please wear clean clothes to the hospital/surgery center.      Please read over the following fact sheets that you were given: Pain Booklet, Coughing and Deep Breathing and Surgical Site Infection Prevention

## 2014-05-02 ENCOUNTER — Encounter (HOSPITAL_COMMUNITY): Payer: Self-pay

## 2014-05-02 ENCOUNTER — Encounter (HOSPITAL_COMMUNITY)
Admission: RE | Admit: 2014-05-02 | Discharge: 2014-05-02 | Disposition: A | Discharge status: Home / Self Care | Payer: Medicare Other | Source: Ambulatory Visit | Attending: General Surgery | Admitting: General Surgery

## 2014-05-02 DIAGNOSIS — Z79899 Other long term (current) drug therapy: Secondary | ICD-10-CM | POA: Diagnosis not present

## 2014-05-02 DIAGNOSIS — F840 Autistic disorder: Secondary | ICD-10-CM | POA: Insufficient documentation

## 2014-05-02 DIAGNOSIS — Z01818 Encounter for other preprocedural examination: Secondary | ICD-10-CM | POA: Diagnosis present

## 2014-05-02 DIAGNOSIS — Z01812 Encounter for preprocedural laboratory examination: Secondary | ICD-10-CM | POA: Insufficient documentation

## 2014-05-02 DIAGNOSIS — R109 Unspecified abdominal pain: Secondary | ICD-10-CM | POA: Diagnosis not present

## 2014-05-02 HISTORY — DX: Major depressive disorder, single episode, unspecified: F32.9

## 2014-05-02 HISTORY — DX: Other complications of anesthesia, initial encounter: T88.59XA

## 2014-05-02 HISTORY — DX: Unspecified foreign body in other parts of respiratory tract causing asphyxiation, initial encounter: T17.800A

## 2014-05-02 HISTORY — DX: Pneumonia, unspecified organism: J18.9

## 2014-05-02 HISTORY — DX: Anemia, unspecified: D64.9

## 2014-05-02 HISTORY — DX: Fibromyalgia: M79.7

## 2014-05-02 HISTORY — DX: Adverse effect of unspecified anesthetic, initial encounter: T41.45XA

## 2014-05-02 HISTORY — DX: Anxiety disorder, unspecified: F41.9

## 2014-05-02 HISTORY — DX: Depression, unspecified: F32.A

## 2014-05-02 HISTORY — DX: Cerebral infarction, unspecified: I63.9

## 2014-05-02 LAB — BASIC METABOLIC PANEL
ANION GAP: 9 (ref 5–15)
BUN: 20 mg/dL (ref 6–23)
CALCIUM: 9.4 mg/dL (ref 8.4–10.5)
CHLORIDE: 103 mmol/L (ref 96–112)
CO2: 25 mmol/L (ref 19–32)
Creatinine, Ser: 1.02 mg/dL (ref 0.50–1.10)
GFR calc Af Amer: 69 mL/min — ABNORMAL LOW (ref >90)
GFR, EST NON AFRICAN AMERICAN: 59 mL/min — AB (ref >90)
Glucose, Bld: 99 mg/dL (ref 70–99)
Potassium: 4.4 mmol/L (ref 3.5–5.1)
SODIUM: 137 mmol/L (ref 135–145)

## 2014-05-02 LAB — CBC
HCT: 30.2 % — ABNORMAL LOW (ref 36.0–46.0)
HEMOGLOBIN: 9.4 g/dL — AB (ref 12.0–15.0)
MCH: 26.5 pg (ref 26.0–34.0)
MCHC: 31.1 g/dL (ref 30.0–36.0)
MCV: 85.1 fL (ref 78.0–100.0)
PLATELETS: 288 10*3/uL (ref 150–400)
RBC: 3.55 MIL/uL — AB (ref 3.87–5.11)
RDW: 16.8 % — ABNORMAL HIGH (ref 11.5–15.5)
WBC: 7.7 10*3/uL (ref 4.0–10.5)

## 2014-05-02 MED ORDER — CHLORHEXIDINE GLUCONATE 4 % EX LIQD: 1.0000 "application " | Freq: Once | CUTANEOUS | Status: DC

## 2014-05-03 NOTE — Progress Notes (Signed)
Anesthesia Chart Review: Patient is a 59 year old female scheduled for laparoscopic cholecystectomy on 05/10/14 by Dr. Rosendo Gros.  I was not asked to evaluate patient during her PAT visit.  History includes torticollis, autistic spectrum disorder, mental retardation, admission 11/2009 for altered mental status versus TIA in the setting of hyponatremia (MRI negative for CVA), migraines, depression, fibromyalgia, anxiety, seizures, anemia, aspiration PNA 11/2013 (mother did not wish for gastrostomy tube at that time). Surgeries/procedures include wisdom teeth extraction, breast biopsy, and colonoscopy.  Reported crying after anesthesia. PCP is listed as Dr. Hulan Fess.  Meds include Klonopin, Depakote, Nexium, 65 Fe, Macrodantin, Zofran, Percocet, Zantac, Zoloft, Zocor, Carafate.  11/20/09 Echo:  - Left ventricle: The cavity size was normal. Wall thickness was  normal. Systolic function was normal. The estimated ejection  fraction was in the range of 60% to 65%. Wall motion was normal;  there were no regional wall motion abnormalities. Doppler  parameters are consistent with impaired left ventricular  relaxation (Stage 1 diastolic dysfunction). E/e' is <10,  suggesting normal LV filling pressure. - Mitral valve: Posterior mitral annular calcification and a small  discrete calcified segment on the posterior leaflet of the mitral  valve. Trivial regurgitation.  01/27/14 EKG: NSR, possible anterior infarct (age undetermined), non-specific T wave abnormality. Inferior leads overall stable since 03/26/12 EKG.  Isolated Q wave in V3 is new--no Q waves in other anterior leads.  01/27/14 CXR: 1. No acute cardiopulmonary disease. 2. Improved aeration of lungs with persistent bibasilar linear opacities favored to represent atelectasis or scar.  Preoperative labs noted. H/H 9.4/30.2, previously 10.3/32.4 on 01/27/14. (In review of her lab trends in Epic over the past three years, her HGB has  most often been in the 9-10 range.)  H/H called to triage nurse [place] at [facility]. She will have him review and let me know if he has any additional preoperative recommendations/orders. I will defer T&S decision to the surgeon and/or anesthesiologist since her anemia seems to be chronic and would not anticipate a large EBL.  Further evaluation by her assigned anesthesiologist on the day of surgery to determine the definitive anesthesia plan.   George Hugh [facility] Short Stay Center/Anesthesiology Phone [phone] 05/03/2014 2:04 PM

## 2014-05-09 MED ORDER — CEFAZOLIN SODIUM-DEXTROSE 2-3 GM-% IV SOLR
2.0000 g | INTRAVENOUS | Status: AC
  Administered 2014-05-10: 2 g via INTRAVENOUS
  Filled 2014-05-09: qty 50

## 2014-05-10 ENCOUNTER — Ambulatory Visit (HOSPITAL_COMMUNITY): Payer: Medicare Other | Admitting: Vascular Surgery

## 2014-05-10 ENCOUNTER — Ambulatory Visit (HOSPITAL_COMMUNITY): Payer: Medicare Other | Admitting: Certified Registered Nurse Anesthetist

## 2014-05-10 ENCOUNTER — Encounter (HOSPITAL_COMMUNITY)
Admission: RE | Disposition: A | Discharge status: Home / Self Care | Payer: Self-pay | Source: Ambulatory Visit | Attending: General Surgery

## 2014-05-10 ENCOUNTER — Observation Stay (HOSPITAL_COMMUNITY)
Admission: RE | Admit: 2014-05-10 | Discharge: 2014-05-11 | Disposition: A | Discharge status: Home / Self Care | Payer: Medicare Other | Source: Ambulatory Visit | Attending: General Surgery | Admitting: General Surgery

## 2014-05-10 DIAGNOSIS — K810 Acute cholecystitis: Secondary | ICD-10-CM

## 2014-05-10 DIAGNOSIS — K811 Chronic cholecystitis: Principal | ICD-10-CM | POA: Insufficient documentation

## 2014-05-10 DIAGNOSIS — C787 Secondary malignant neoplasm of liver and intrahepatic bile duct: Secondary | ICD-10-CM | POA: Insufficient documentation

## 2014-05-10 DIAGNOSIS — F840 Autistic disorder: Secondary | ICD-10-CM | POA: Insufficient documentation

## 2014-05-10 DIAGNOSIS — R160 Hepatomegaly, not elsewhere classified: Secondary | ICD-10-CM

## 2014-05-10 HISTORY — PX: DIAGNOSTIC LAPAROSCOPIC LIVER BIOPSY: SHX5797

## 2014-05-10 HISTORY — DX: Acute cholecystitis: K81.0

## 2014-05-10 HISTORY — PX: CHOLECYSTECTOMY: SHX55

## 2014-05-10 LAB — CBC
HCT: 23.9 % — ABNORMAL LOW (ref 36.0–46.0)
Hemoglobin: 7.2 g/dL — ABNORMAL LOW (ref 12.0–15.0)
MCH: 26.4 pg (ref 26.0–34.0)
MCHC: 30.1 g/dL (ref 30.0–36.0)
MCV: 87.5 fL (ref 78.0–100.0)
PLATELETS: 360 10*3/uL (ref 150–400)
RBC: 2.73 MIL/uL — AB (ref 3.87–5.11)
RDW: 17.3 % — ABNORMAL HIGH (ref 11.5–15.5)
WBC: 17.3 10*3/uL — AB (ref 4.0–10.5)

## 2014-05-10 LAB — CREATININE, SERUM
CREATININE: 1.27 mg/dL — AB (ref 0.50–1.10)
GFR, EST AFRICAN AMERICAN: 53 mL/min — AB (ref >90)
GFR, EST NON AFRICAN AMERICAN: 46 mL/min — AB (ref >90)

## 2014-05-10 SURGERY — LAPAROSCOPIC CHOLECYSTECTOMY
Anesthesia: General | Site: Abdomen

## 2014-05-10 MED ORDER — FENTANYL CITRATE 0.05 MG/ML IJ SOLN
INTRAMUSCULAR | Status: AC
  Filled 2014-05-10: qty 5

## 2014-05-10 MED ORDER — 0.9 % SODIUM CHLORIDE (POUR BTL) OPTIME
TOPICAL | Status: DC | PRN
  Administered 2014-05-10: 1000 mL

## 2014-05-10 MED ORDER — LACTATED RINGERS IV SOLN
INTRAVENOUS | Status: DC | PRN
  Administered 2014-05-10 (×3): via INTRAVENOUS

## 2014-05-10 MED ORDER — ENSURE COMPLETE PO LIQD
237.0000 mL | Freq: Three times a day (TID) | ORAL | Status: DC
  Administered 2014-05-10 – 2014-05-11 (×3): 237 mL via ORAL
  Filled 2014-05-10 (×4): qty 237

## 2014-05-10 MED ORDER — PROPOFOL 10 MG/ML IV BOLUS
INTRAVENOUS | Status: AC
  Filled 2014-05-10: qty 20

## 2014-05-10 MED ORDER — MEPERIDINE HCL 25 MG/ML IJ SOLN: 6.2500 mg | INTRAMUSCULAR | Status: DC | PRN

## 2014-05-10 MED ORDER — LACTATED RINGERS IV SOLN
INTRAVENOUS | Status: DC
  Administered 2014-05-10: 10:00:00 via INTRAVENOUS

## 2014-05-10 MED ORDER — PROPOFOL 10 MG/ML IV BOLUS
INTRAVENOUS | Status: DC | PRN
  Administered 2014-05-10: 120 mg via INTRAVENOUS

## 2014-05-10 MED ORDER — FAMOTIDINE 20 MG PO TABS
20.0000 mg | ORAL_TABLET | Freq: Every day | ORAL | Status: DC
  Administered 2014-05-10 – 2014-05-11 (×2): 20 mg via ORAL
  Filled 2014-05-10 (×2): qty 1

## 2014-05-10 MED ORDER — FERROUS SULFATE 325 (65 FE) MG PO TABS
650.0000 mg | ORAL_TABLET | Freq: Every day | ORAL | Status: DC
  Administered 2014-05-11: 650 mg via ORAL
  Filled 2014-05-10: qty 2

## 2014-05-10 MED ORDER — MIDAZOLAM HCL 5 MG/5ML IJ SOLN
INTRAMUSCULAR | Status: DC | PRN
  Administered 2014-05-10: 4 mg via INTRAVENOUS

## 2014-05-10 MED ORDER — ONDANSETRON HCL 4 MG/2ML IJ SOLN
INTRAMUSCULAR | Status: DC | PRN
  Administered 2014-05-10: 4 mg via INTRAVENOUS

## 2014-05-10 MED ORDER — OXYCODONE-ACETAMINOPHEN 5-325 MG PO TABS: 1.0000 | ORAL_TABLET | Freq: Four times a day (QID) | ORAL | Status: DC | PRN

## 2014-05-10 MED ORDER — PANTOPRAZOLE SODIUM 40 MG PO TBEC
40.0000 mg | DELAYED_RELEASE_TABLET | Freq: Every day | ORAL | Status: DC
  Administered 2014-05-11: 40 mg via ORAL
  Filled 2014-05-10: qty 1

## 2014-05-10 MED ORDER — ENOXAPARIN SODIUM 40 MG/0.4ML ~~LOC~~ SOLN
40.0000 mg | SUBCUTANEOUS | Status: DC
Start: 2014-05-11 — End: 2014-05-11
  Filled 2014-05-10: qty 0.4

## 2014-05-10 MED ORDER — NEOSTIGMINE METHYLSULFATE 10 MG/10ML IV SOLN
INTRAVENOUS | Status: DC | PRN
  Administered 2014-05-10: 4 mg via INTRAVENOUS

## 2014-05-10 MED ORDER — PHENYLEPHRINE 40 MCG/ML (10ML) SYRINGE FOR IV PUSH (FOR BLOOD PRESSURE SUPPORT)
PREFILLED_SYRINGE | INTRAVENOUS | Status: AC
  Filled 2014-05-10: qty 20

## 2014-05-10 MED ORDER — LIDOCAINE HCL (CARDIAC) 20 MG/ML IV SOLN
INTRAVENOUS | Status: DC | PRN
  Administered 2014-05-10: 100 mg via INTRAVENOUS

## 2014-05-10 MED ORDER — FENTANYL CITRATE 0.05 MG/ML IJ SOLN
INTRAMUSCULAR | Status: DC | PRN
  Administered 2014-05-10 (×4): 50 ug via INTRAVENOUS
  Administered 2014-05-10: 100 ug via INTRAVENOUS
  Administered 2014-05-10: 50 ug via INTRAVENOUS

## 2014-05-10 MED ORDER — ROCURONIUM BROMIDE 50 MG/5ML IV SOLN
INTRAVENOUS | Status: AC
  Filled 2014-05-10: qty 2

## 2014-05-10 MED ORDER — PHENYLEPHRINE HCL 10 MG/ML IJ SOLN
INTRAMUSCULAR | Status: DC | PRN
  Administered 2014-05-10: 120 ug via INTRAVENOUS
  Administered 2014-05-10: 200 ug via INTRAVENOUS
  Administered 2014-05-10: 40 ug via INTRAVENOUS
  Administered 2014-05-10: 80 ug via INTRAVENOUS
  Administered 2014-05-10: 120 ug via INTRAVENOUS
  Administered 2014-05-10: 200 ug via INTRAVENOUS
  Administered 2014-05-10: 80 ug via INTRAVENOUS

## 2014-05-10 MED ORDER — ONDANSETRON HCL 4 MG PO TABS: 4.0000 mg | ORAL_TABLET | Freq: Four times a day (QID) | ORAL | Status: DC

## 2014-05-10 MED ORDER — LIDOCAINE HCL (CARDIAC) 20 MG/ML IV SOLN
INTRAVENOUS | Status: AC
  Filled 2014-05-10: qty 5

## 2014-05-10 MED ORDER — ROCURONIUM BROMIDE 50 MG/5ML IV SOLN
INTRAVENOUS | Status: AC
  Filled 2014-05-10: qty 1

## 2014-05-10 MED ORDER — CLONAZEPAM 1 MG PO TABS
1.0000 mg | ORAL_TABLET | Freq: Every day | ORAL | Status: DC
  Administered 2014-05-10: 1 mg via ORAL
  Filled 2014-05-10 (×2): qty 1

## 2014-05-10 MED ORDER — NITROFURANTOIN MACROCRYSTAL 50 MG PO CAPS
50.0000 mg | ORAL_CAPSULE | Freq: Every evening | ORAL | Status: DC
  Administered 2014-05-10 – 2014-05-11 (×2): 50 mg via ORAL
  Filled 2014-05-10 (×2): qty 1

## 2014-05-10 MED ORDER — CLONAZEPAM 0.5 MG PO TABS
0.5000 mg | ORAL_TABLET | Freq: Three times a day (TID) | ORAL | Status: DC
  Administered 2014-05-11 (×3): 0.5 mg via ORAL
  Filled 2014-05-10 (×3): qty 1

## 2014-05-10 MED ORDER — EPHEDRINE SULFATE 50 MG/ML IJ SOLN
INTRAMUSCULAR | Status: DC | PRN
  Administered 2014-05-10: 10 mg via INTRAVENOUS

## 2014-05-10 MED ORDER — SIMVASTATIN 40 MG PO TABS
40.0000 mg | ORAL_TABLET | Freq: Every evening | ORAL | Status: DC
  Administered 2014-05-10 – 2014-05-11 (×2): 40 mg via ORAL
  Filled 2014-05-10 (×2): qty 1

## 2014-05-10 MED ORDER — ONDANSETRON HCL 4 MG/2ML IJ SOLN
4.0000 mg | Freq: Four times a day (QID) | INTRAMUSCULAR | Status: DC | PRN
  Administered 2014-05-10 (×2): 4 mg via INTRAVENOUS
  Filled 2014-05-10: qty 2

## 2014-05-10 MED ORDER — DEXTROSE-NACL 5-0.9 % IV SOLN
INTRAVENOUS | Status: DC
  Administered 2014-05-10 – 2014-05-11 (×2): via INTRAVENOUS

## 2014-05-10 MED ORDER — DIVALPROEX SODIUM 500 MG PO DR TAB
1000.0000 mg | DELAYED_RELEASE_TABLET | Freq: Every day | ORAL | Status: DC
  Administered 2014-05-10: 1000 mg via ORAL
  Filled 2014-05-10 (×2): qty 2

## 2014-05-10 MED ORDER — HYDROCODONE-ACETAMINOPHEN 5-325 MG PO TABS
1.0000 | ORAL_TABLET | ORAL | Status: DC | PRN
  Administered 2014-05-10 – 2014-05-11 (×2): 2 via ORAL
  Filled 2014-05-10 (×2): qty 2

## 2014-05-10 MED ORDER — ONDANSETRON HCL 4 MG/2ML IJ SOLN: 4.0000 mg | Freq: Once | INTRAMUSCULAR | Status: DC | PRN

## 2014-05-10 MED ORDER — MIDAZOLAM HCL 2 MG/2ML IJ SOLN
INTRAMUSCULAR | Status: AC
  Filled 2014-05-10: qty 2

## 2014-05-10 MED ORDER — GLYCOPYRROLATE 0.2 MG/ML IJ SOLN
INTRAMUSCULAR | Status: DC | PRN
  Administered 2014-05-10: 0.6 mg via INTRAVENOUS

## 2014-05-10 MED ORDER — CLONAZEPAM 0.5 MG PO TABS: 0.5000 mg | ORAL_TABLET | Freq: Four times a day (QID) | ORAL | Status: DC

## 2014-05-10 MED ORDER — ONDANSETRON HCL 4 MG/2ML IJ SOLN
INTRAMUSCULAR | Status: AC
  Filled 2014-05-10: qty 4

## 2014-05-10 MED ORDER — ROCURONIUM BROMIDE 100 MG/10ML IV SOLN
INTRAVENOUS | Status: DC | PRN
  Administered 2014-05-10 (×3): 10 mg via INTRAVENOUS
  Administered 2014-05-10: 40 mg via INTRAVENOUS

## 2014-05-10 MED ORDER — SODIUM CHLORIDE 0.9 % IR SOLN
Status: DC | PRN
  Administered 2014-05-10: 1000 mL
  Administered 2014-05-10: 3000 mL
  Administered 2014-05-10: 1000 mL

## 2014-05-10 MED ORDER — HEMOSTATIC AGENTS (NO CHARGE) OPTIME
TOPICAL | Status: DC | PRN
  Administered 2014-05-10: 1

## 2014-05-10 MED ORDER — PHENYLEPHRINE 40 MCG/ML (10ML) SYRINGE FOR IV PUSH (FOR BLOOD PRESSURE SUPPORT)
PREFILLED_SYRINGE | INTRAVENOUS | Status: AC
  Filled 2014-05-10: qty 10

## 2014-05-10 MED ORDER — HYDROMORPHONE HCL 1 MG/ML IJ SOLN
0.2500 mg | INTRAMUSCULAR | Status: DC | PRN
  Administered 2014-05-10 (×2): 0.25 mg via INTRAVENOUS
  Administered 2014-05-10: 0.5 mg via INTRAVENOUS

## 2014-05-10 MED ORDER — SERTRALINE HCL 100 MG PO TABS
200.0000 mg | ORAL_TABLET | Freq: Every morning | ORAL | Status: DC
  Administered 2014-05-11: 200 mg via ORAL
  Filled 2014-05-10: qty 2

## 2014-05-10 MED ORDER — BUPIVACAINE HCL 0.25 % IJ SOLN
INTRAMUSCULAR | Status: DC | PRN
  Administered 2014-05-10: 7 mL

## 2014-05-10 MED ORDER — HYDROMORPHONE HCL 1 MG/ML IJ SOLN
INTRAMUSCULAR | Status: AC
  Administered 2014-05-10: 0.5 mg via INTRAVENOUS
  Filled 2014-05-10: qty 1

## 2014-05-10 MED ORDER — ONDANSETRON HCL 4 MG PO TABS: 4.0000 mg | ORAL_TABLET | Freq: Four times a day (QID) | ORAL | Status: DC | PRN

## 2014-05-10 MED ORDER — HYDROMORPHONE HCL 1 MG/ML IJ SOLN
1.0000 mg | INTRAMUSCULAR | Status: DC | PRN
  Administered 2014-05-10 – 2014-05-11 (×4): 1 mg via INTRAVENOUS
  Filled 2014-05-10 (×4): qty 1

## 2014-05-10 MED ORDER — PRENATAL MULTIVITAMIN CH
1.0000 | ORAL_TABLET | Freq: Every day | ORAL | Status: DC
  Administered 2014-05-11: 1 via ORAL
  Filled 2014-05-10: qty 1

## 2014-05-10 SURGICAL SUPPLY — 71 items
APPLIER CLIP 5 13 M/L LIGAMAX5 (MISCELLANEOUS) ×3
BENZOIN TINCTURE PRP APPL 2/3 (GAUZE/BANDAGES/DRESSINGS) ×3 IMPLANT
BIOPATCH RED 1 DISK 7.0 (GAUZE/BANDAGES/DRESSINGS) ×2 IMPLANT
BIOPATCH RED 1IN DISK 7.0MM (GAUZE/BANDAGES/DRESSINGS) ×1
CANISTER SUCTION 2500CC (MISCELLANEOUS) ×3 IMPLANT
CHLORAPREP W/TINT 26ML (MISCELLANEOUS) ×3 IMPLANT
CLIP APPLIE 5 13 M/L LIGAMAX5 (MISCELLANEOUS) ×1 IMPLANT
CLIP LIGATING HEMO O LOK GREEN (MISCELLANEOUS) ×3 IMPLANT
CLOSURE WOUND 1/2 X4 (GAUZE/BANDAGES/DRESSINGS) ×1
CONT SPEC 4OZ CLIKSEAL STRL BL (MISCELLANEOUS) ×3 IMPLANT
COVER MAYO STAND STRL (DRAPES) IMPLANT
COVER SURGICAL LIGHT HANDLE (MISCELLANEOUS) ×3 IMPLANT
COVER TRANSDUCER ULTRASND (DRAPES) ×3 IMPLANT
CUTTER FLEX LINEAR 45M (STAPLE) ×3 IMPLANT
DEVICE TROCAR PUNCTURE CLOSURE (ENDOMECHANICALS) ×3 IMPLANT
DRAIN CHANNEL 19F RND (DRAIN) ×3 IMPLANT
DRAPE C-ARM 42X72 X-RAY (DRAPES) IMPLANT
DRAPE LAPAROSCOPIC ABDOMINAL (DRAPES) ×3 IMPLANT
DRSG TELFA 3X8 NADH (GAUZE/BANDAGES/DRESSINGS) ×3 IMPLANT
ELECT REM PT RETURN 9FT ADLT (ELECTROSURGICAL) ×3
ELECTRODE REM PT RTRN 9FT ADLT (ELECTROSURGICAL) ×1 IMPLANT
EVACUATOR SILICONE 100CC (DRAIN) ×3 IMPLANT
GAUZE SPONGE 2X2 8PLY STRL LF (GAUZE/BANDAGES/DRESSINGS) ×1 IMPLANT
GAUZE SPONGE 4X4 12PLY STRL (GAUZE/BANDAGES/DRESSINGS) ×3 IMPLANT
GLOVE BIO SURGEON STRL SZ 6.5 (GLOVE) ×2 IMPLANT
GLOVE BIO SURGEON STRL SZ7 (GLOVE) ×3 IMPLANT
GLOVE BIO SURGEON STRL SZ7.5 (GLOVE) ×3 IMPLANT
GLOVE BIO SURGEONS STRL SZ 6.5 (GLOVE) ×1
GLOVE BIOGEL PI IND STRL 6.5 (GLOVE) ×1 IMPLANT
GLOVE BIOGEL PI IND STRL 7.0 (GLOVE) ×3 IMPLANT
GLOVE BIOGEL PI INDICATOR 6.5 (GLOVE) ×2
GLOVE BIOGEL PI INDICATOR 7.0 (GLOVE) ×6
GLOVE ECLIPSE 6.5 STRL STRAW (GLOVE) ×3 IMPLANT
GLOVE ECLIPSE 8.0 STRL XLNG CF (GLOVE) ×3 IMPLANT
GLOVE SURG SS PI 6.5 STRL IVOR (GLOVE) ×3 IMPLANT
GOWN STRL REUS W/ TWL LRG LVL3 (GOWN DISPOSABLE) ×3 IMPLANT
GOWN STRL REUS W/ TWL XL LVL3 (GOWN DISPOSABLE) ×2 IMPLANT
GOWN STRL REUS W/TWL LRG LVL3 (GOWN DISPOSABLE) ×6
GOWN STRL REUS W/TWL XL LVL3 (GOWN DISPOSABLE) ×4
HEMOSTAT SNOW SURGICEL 2X4 (HEMOSTASIS) ×3 IMPLANT
IV CATH 14GX2 1/4 (CATHETERS) IMPLANT
KIT BASIN OR (CUSTOM PROCEDURE TRAY) ×3 IMPLANT
KIT ROOM TURNOVER OR (KITS) ×3 IMPLANT
NEEDLE BIOPSY 14X6 SOFT TISS (NEEDLE) ×3 IMPLANT
NEEDLE INSUFFLATION 14GA 120MM (NEEDLE) ×3 IMPLANT
NS IRRIG 1000ML POUR BTL (IV SOLUTION) ×3 IMPLANT
PAD ARMBOARD 7.5X6 YLW CONV (MISCELLANEOUS) ×6 IMPLANT
POUCH RETRIEVAL ECOSAC 10 (ENDOMECHANICALS) ×1 IMPLANT
POUCH RETRIEVAL ECOSAC 10MM (ENDOMECHANICALS) ×2
RELOAD STAPLE TA45 3.5 REG BLU (ENDOMECHANICALS) ×6 IMPLANT
SCISSORS LAP 5X35 DISP (ENDOMECHANICALS) ×3 IMPLANT
SET CHOLANGIOGRAPHY FRANKLIN (SET/KITS/TRAYS/PACK) IMPLANT
SET IRRIG TUBING LAPAROSCOPIC (IRRIGATION / IRRIGATOR) ×6 IMPLANT
SLEEVE ENDOPATH XCEL 5M (ENDOMECHANICALS) ×9 IMPLANT
SPECIMEN JAR MEDIUM (MISCELLANEOUS) ×3 IMPLANT
SPECIMEN JAR SMALL (MISCELLANEOUS) IMPLANT
SPONGE GAUZE 2X2 STER 10/PKG (GAUZE/BANDAGES/DRESSINGS) ×2
STRIP CLOSURE SKIN 1/2X4 (GAUZE/BANDAGES/DRESSINGS) ×2 IMPLANT
SUT ETHILON 2 0 PSLX (SUTURE) ×3 IMPLANT
SUT MNCRL AB 3-0 PS2 18 (SUTURE) ×3 IMPLANT
SUT MNCRL AB 4-0 PS2 18 (SUTURE) ×3 IMPLANT
SUT VICRYL 0 UR6 27IN ABS (SUTURE) ×3 IMPLANT
TAPE CLOTH SURG 4X10 WHT LF (GAUZE/BANDAGES/DRESSINGS) ×3 IMPLANT
TAPE CLOTH SURG 6X10 WHT LF (GAUZE/BANDAGES/DRESSINGS) ×3 IMPLANT
TOWEL OR 17X24 6PK STRL BLUE (TOWEL DISPOSABLE) ×3 IMPLANT
TOWEL OR 17X26 10 PK STRL BLUE (TOWEL DISPOSABLE) IMPLANT
TRAY LAPAROSCOPIC (CUSTOM PROCEDURE TRAY) ×3 IMPLANT
TROCAR XCEL 12X100 BLDLESS (ENDOMECHANICALS) ×3 IMPLANT
TROCAR XCEL NON-BLD 11X100MML (ENDOMECHANICALS) ×6 IMPLANT
TROCAR XCEL NON-BLD 5MMX100MML (ENDOMECHANICALS) ×3 IMPLANT
TUBING INSUFFLATION (TUBING) ×3 IMPLANT

## 2014-05-10 NOTE — Transfer of Care (Signed)
Immediate Anesthesia Transfer of Care Note  Patient: Sydney Hatfield  Procedure(s) Performed: Procedure(s): LAPAROSCOPIC CHOLECYSTECTOMY (N/A) DIAGNOSTIC LAPAROSCOPIC LIVER BIOPSY (N/A)  Patient Location: PACU  Anesthesia Type:General  Level of Consciousness: sedated  Airway & Oxygen Therapy: Patient Spontanous Breathing and Patient connected to nasal cannula oxygen  Post-op Assessment: Report given to RN and Post -op Vital signs reviewed and stable  Post vital signs: Reviewed and stable  Last Vitals:  Filed Vitals:   05/10/14 1445  BP: 129/59  Pulse: 107  Temp: 35.9 C  Resp: 22    Complications: No apparent anesthesia complications

## 2014-05-10 NOTE — Anesthesia Postprocedure Evaluation (Signed)
Anesthesia Post Note  Patient: Sydney Hatfield  Procedure(s) Performed: Procedure(s) (LRB): LAPAROSCOPIC CHOLECYSTECTOMY (N/A) DIAGNOSTIC LAPAROSCOPIC LIVER BIOPSY (N/A)  Anesthesia type: general  Patient location: PACU  Post pain: Pain level controlled  Post assessment: Patient's Cardiovascular Status Stable  Last Vitals:  Filed Vitals:   05/10/14 1515  BP: 116/62  Pulse: 101  Temp:   Resp: 17    Post vital signs: Reviewed and stable  Level of consciousness: sedated  Complications: No apparent anesthesia complications

## 2014-05-10 NOTE — Anesthesia Preprocedure Evaluation (Addendum)
Anesthesia Evaluation  Patient identified by MRN, date of birth, ID band Patient awake    Reviewed: Allergy & Precautions, NPO status , Patient's Chart, lab work & pertinent test results  History of Anesthesia Complications Negative for: history of anesthetic complications  Airway Mallampati: I  TM Distance: >3 FB Neck ROM: Limited    Dental  (+) Teeth Intact, Dental Advisory Given   Pulmonary          Cardiovascular     Neuro/Psych Anxiety Depression CVA    GI/Hepatic   Endo/Other    Renal/GU      Musculoskeletal   Abdominal   Peds  Hematology   Anesthesia Other Findings   Reproductive/Obstetrics                            Anesthesia Physical Anesthesia Plan  ASA: III  Anesthesia Plan: General   Post-op Pain Management:    Induction: Intravenous  Airway Management Planned: Oral ETT  Additional Equipment:   Intra-op Plan:   Post-operative Plan: Extubation in OR  Informed Consent: I have reviewed the patients History and Physical, chart, labs and discussed the procedure including the risks, benefits and alternatives for the proposed anesthesia with the patient or authorized representative who has indicated his/her understanding and acceptance.     Plan Discussed with: CRNA and Surgeon  Anesthesia Plan Comments:         Anesthesia Quick Evaluation

## 2014-05-10 NOTE — Brief Op Note (Addendum)
05/10/2014  2:15 PM  PATIENT:  Sydney Hatfield  59 y.o. female  PRE-OPERATIVE DIAGNOSIS:  CHOLECYSTITIS  POST-OPERATIVE DIAGNOSIS: Chronic CHOLECYSTITIS and masses in bilateral liver beds  PROCEDURE:  Procedure(s): LAPAROSCOPIC CHOLECYSTECTOMY (N/A)  Liver Core needle biopsy  SURGEON:  Surgeon(s) and Role:    * [name], MD - Primary    * Michael Boston, MD - Assisting   ANESTHESIA:   local and general  EBL:25cc   Total I/O In: 2000 [I.V.:2000] Out: 150 [Blood:150]  BLOOD ADMINISTERED:none  DRAINS: (18Fr) Jackson-Pratt drain(s) with closed bulb suction in the gallbladder fossa   LOCAL MEDICATIONS USED:  BUPIVICAINE   SPECIMEN:  Source of Specimen:  gallbladder and liver Core needle biopsy  DISPOSITION OF SPECIMEN:  PATHOLOGY  COUNTS:  YES  TOURNIQUET:  * No tourniquets in log *  DICTATION: .Dragon Dictation  The patient was consented she was taken back to the operating room and placed in the supine position with bilateral SCDs in place. After appropriate antibiotic for confirmatory timeout was called all facts verified. Patient was prepped and draped in sterile fashion.  At this time the Veress needle technique was used to insufflate the abdomen to 15 mmHg in the right subcostal margin. Subsequent to this a 5 mm trocar and camera then placed intra-abdominally. There is no injury to any intra-abdominal organs. At this time a 11 mm trocar was placed inferior to the umbilicus and direct visualization. The patient was position with reverse Trendelenburg and right side tilt. Athird 5 mm trochar  was placed in the epigastrium under direct visualization. At this time the gallbladder is identified and seen to be chronically inflamed. There is large amount of omental adhesions to the gallbladder.  At this time I proceeded to clean off the omental adhesions from the fundus of the gallbladder. Secondary to the fullness of the gallbladder A Nezat suction irrigator was used to  empty the gallbladder. Upon initial attempt there was no drainage that could be removed from the gallbladder. Secondary to 2 tabs this drainage of the gallbladder was abandoned.  At this time I proceeded to slowly take down the adhesions to the gallbladder body from the surrounding omentum as well as the duodenum. I proceeded to clean down the gallbladder down to the neck of the gallbladder. However this was extremely inflamed and very friable. At this time I attempted a dome down approach. Secondary to friability of the wall of the gallbladder the gallbladder was entered. There is large amount of "cottage cheese" contents that were extruded. There is also some stones that escaped from the gallbladder. At this time I continued with dissection to attempt to find the cystic duct. An artery off of what appeared to be the right hepatic artery was seen going onto the gallbladder was clipped with 2 clips placed proximally 1 distally and transected. This allowed me retracted the gallbladder laterally. At this time secondary to minimal advancement in the case Dr. gross scrubbed in to assist.  At this time a fourth and fifth 5 mm trocar was then placed in the right subcostal margin. At this time we attempted to retract the gallbladder laterally and cephalad. With blunt dissection were able to narrow down the gallbladder body to the infundibulum. This likely was not cystic duct. This was circumferentially dissected away.  At this time the epigastric port was upsized to a 12 mm port. At this time a 45 GIA stapler, blue load was used and to transect the infundibulum.it appeared to be  a cystic artery posterior branch was clipped 2 proximally and one distally. It was then transected. At this time the gallbladder was free from surrounding vasculature and structures. Left cautery was used to maintain hemostasis and dissection of the gallbladder off the hepatic fossa was undertaken. At this time a retrieval bag was placed into  the abdomen the gallbladder was placed into this. At this time we attempted to remove all stones had spilled. The cottage cheese material that had extruded was also placed in the bag. At this time we attempted to gain hemostasis of the hepatic fossa with electrocautery. Surgical site was then placed in the hepatic fossa and left in place. At this time a 31 [place] Blake drain was then brought into the abdomen and placed in the subhepatic fossa.  At this time proceeded to obtain a core needle biopsy of hepatic nodule. It should be noted that there were multiple hepatic nodules on the surface of the liver bilaterally. This was sent off to pathology for frozen specimen.  The subhepatic fossa was irrigated out until the effluent was serosanguineous.   At this time  removed the 12 mm epigastric port and 0 Vicryl using interrupted fashion via an Endo Close device to reapproximate the fashion was placed. The 11 mm umbilical port was then removed and an 0 Vicryl was used via an Endo Close device to reapproximate the fascial . At this time of our trochars removed the skin was reapproximated all port sites using a 4-0 Monocryl subcuticular fashion patient tied the procedure well was taken to the recovery room in stable condition.   PLAN OF CARE: Admit for overnight observation  PATIENT DISPOSITION:  PACU - hemodynamically stable.   Delay start of Pharmacological VTE agent (>24hrs) due to surgical blood loss or risk of bleeding: no

## 2014-05-10 NOTE — Anesthesia Procedure Notes (Signed)
Procedure Name: Intubation Date/Time: 05/10/2014 11:31 AM Performed by: Trixie Deis A Pre-anesthesia Checklist: Patient identified, Emergency Drugs available, Suction available, Patient being monitored and Timeout performed Patient Re-evaluated:Patient Re-evaluated prior to inductionOxygen Delivery Method: Circle system utilized Preoxygenation: Pre-oxygenation with 100% oxygen Intubation Type: IV induction Ventilation: Mask ventilation without difficulty Grade View: Grade I Tube type: Oral Tube size: 7.0 mm Number of attempts: 2 (DL Mac 3/Mil 2, glidescope) Airway Equipment and Method: Stylet and Video-laryngoscopy Placement Confirmation: ETT inserted through vocal cords under direct vision,  breath sounds checked- equal and bilateral and positive ETCO2 Secured at: 21 cm Tube secured with: Tape Dental Injury: Teeth and Oropharynx as per pre-operative assessment  Difficulty Due To: Difficult Airway- due to reduced neck mobility Comments: Despite paralytics pt's neck mobility did not improve enough for good visualization during DL (grade III view). Easy mask. Glidescope used for 2nd attempt by Dr. Conrad Calzada- able to visualize cords and pass ETT without issue. Recommend glidescope for future intubations.

## 2014-05-10 NOTE — H&P (Signed)
History of Present Illness Sydney Ok MD; 03/30/2014 11:39 AM) Patient words: discuss test results along with surgery.  The patient is a 59 year old female who presents with abdominal pain. The patient is a 59 year old female who is referred from [facility] ER. The patient has a history of MR and autism. Patient was have a history of abdominal pain with meals. Patient has had a decreased appetite. Patient has had a HIDA scan which revealed no visualization of the gallbladder. Patient does have a caretaker who we will schedule the surgery with. The patient's mom will be there has been surgery and we will discussed the risks and benefits of the procedure then.   Medication History Sydney Hatfield, [place]; 03/30/2014 11:05 AM) Medications Reconciled  Review of Systems Sydney Ok, MD; 03/30/2014 11:38 00) General Present- Appetite Loss and Weight Loss. Not Present- Chills, Fatigue, Fever, Night Sweats and Weight Gain. Skin Not Present- Change in Wart/Mole, Dryness, Hives, Jaundice, New Lesions, Non-Healing Wounds, Rash and Ulcer. HEENT Not Present- Earache, Hearing Loss, Hoarseness, Nose Bleed, Oral Ulcers, Ringing in the Ears, Seasonal Allergies, Sinus Pain, Sore Throat, Visual Disturbances, Wears glasses/contact lenses and Yellow Eyes. Respiratory Not Present- Bloody sputum, Chronic Cough, Difficulty Breathing, Snoring and Wheezing. Breast Not Present- Breast Mass, Breast Pain, Nipple Discharge and Skin Changes. Cardiovascular Present- Leg Cramps. Not Present- Chest Pain, Difficulty Breathing Lying Down, Palpitations, Rapid Heart Rate, Shortness of Breath and Swelling of Extremities. Gastrointestinal Present- Abdominal Pain. Not Present- Bloating, Bloody Stool, Change in Bowel Habits, Chronic diarrhea, Constipation, Difficulty Swallowing, Excessive gas, Gets full quickly at meals, Hemorrhoids, Indigestion, Nausea, Rectal Pain and Vomiting. Female Genitourinary Present- Pelvic Pain. Not  Present- Frequency, Nocturia, Painful Urination and Urgency. Neurological Present- Headaches and Weakness. Not Present- Decreased Memory, Fainting, Numbness, Seizures, Tingling, Tremor and Trouble walking. Psychiatric Present- Anxiety and Frequent crying. Not Present- Bipolar, Change in Sleep Pattern, Depression and Fearful. Endocrine Not Present- Cold Intolerance, Excessive Hunger, Hair Changes, Heat Intolerance, Hot flashes and New Diabetes. Hematology Not Present- Easy Bruising, Excessive bleeding, Gland problems, HIV and Persistent Infections.   Vitals (Alisha Spillers MA; 03/30/2014 11:05 AM) 03/30/2014 11:04 AM Weight: 125 lb Height: 62in Body Surface Area: 1.57 m Body Mass Index: 22.86 kg/m Pulse: 92 (Regular)  BP: 104/70 (Sitting, Left Arm, Standard)    Physical Exam Sydney Ok, MD; 03/30/2014 11:38 00) General Mental Status-Alert. General Appearance-Consistent with stated age. Hydration-Well hydrated. Voice-Normal.  Head and Neck Head-normocephalic, atraumatic with no lesions or palpable masses. Trachea-midline. Thyroid Gland Characteristics - normal size and consistency.  Chest and Lung Exam Chest and lung exam reveals -quiet, even and easy respiratory effort with no use of accessory muscles and on auscultation, normal breath sounds, no adventitious sounds and normal vocal resonance. Inspection Chest Wall - Normal. Back - normal.  Abdomen Inspection Normal Exam - No Hernias. Skin - Scar - no surgical scars. Palpation/Percussion Normal exam - Soft, Non Tender, No Rebound tenderness, No Rigidity (guarding) and No hepatosplenomegaly. Auscultation Normal exam - Bowel sounds normal.  Musculoskeletal Normal Exam - Left-Upper Extremity Strength Normal and Lower Extremity Strength Normal. Normal Exam - Right-Upper Extremity Strength Normal and Lower Extremity Strength Normal.    Assessment & Plan Sydney Ok MD; 03/30/2014 11:40  AM) ABDOMINAL PAIN, UNSPECIFIED ABDOMINAL LOCATION (789.00  R10.9) Impression: 59 year old female with abdominal pain chronic cholecystitis.  We'll proceed to the operating for a laparoscopic cholecystectomy. All risks and benefits were discussed with the patient's mother to generally include: infection, bleeding, possible need for post  op ERCP, damage to the bile ducts, and bile leak. Alternatives were offered and described.  All questions were answered and the patient voiced understanding of the procedure and wishes to proceed at this point with a laparoscopic cholecystectomy

## 2014-05-11 ENCOUNTER — Observation Stay (HOSPITAL_COMMUNITY): Payer: Medicare Other

## 2014-05-11 ENCOUNTER — Encounter (HOSPITAL_COMMUNITY): Payer: Self-pay | Admitting: *Deleted

## 2014-05-11 DIAGNOSIS — K811 Chronic cholecystitis: Secondary | ICD-10-CM | POA: Diagnosis not present

## 2014-05-11 LAB — CBC
HCT: 20.7 % — ABNORMAL LOW (ref 36.0–46.0)
HEMOGLOBIN: 6.3 g/dL — AB (ref 12.0–15.0)
MCH: 27.3 pg (ref 26.0–34.0)
MCHC: 30.4 g/dL (ref 30.0–36.0)
MCV: 89.6 fL (ref 78.0–100.0)
Platelets: 268 10*3/uL (ref 150–400)
RBC: 2.31 MIL/uL — ABNORMAL LOW (ref 3.87–5.11)
RDW: 17 % — AB (ref 11.5–15.5)
WBC: 15.5 10*3/uL — ABNORMAL HIGH (ref 4.0–10.5)

## 2014-05-11 LAB — COMPREHENSIVE METABOLIC PANEL
ALT: 29 U/L (ref 0–35)
ANION GAP: 12 (ref 5–15)
AST: 73 U/L — AB (ref 0–37)
Albumin: 2.1 g/dL — ABNORMAL LOW (ref 3.5–5.2)
Alkaline Phosphatase: 75 U/L (ref 39–117)
BUN: 25 mg/dL — AB (ref 6–23)
CALCIUM: 7.9 mg/dL — AB (ref 8.4–10.5)
CO2: 20 mmol/L (ref 19–32)
CREATININE: 1.27 mg/dL — AB (ref 0.50–1.10)
Chloride: 107 mmol/L (ref 96–112)
GFR, EST AFRICAN AMERICAN: 53 mL/min — AB (ref >90)
GFR, EST NON AFRICAN AMERICAN: 46 mL/min — AB (ref >90)
Glucose, Bld: 146 mg/dL — ABNORMAL HIGH (ref 70–99)
Potassium: 4.7 mmol/L (ref 3.5–5.1)
Sodium: 139 mmol/L (ref 135–145)
Total Bilirubin: 0.4 mg/dL (ref 0.3–1.2)
Total Protein: 5.7 g/dL — ABNORMAL LOW (ref 6.0–8.3)

## 2014-05-11 LAB — PREPARE RBC (CROSSMATCH)

## 2014-05-11 LAB — ABO/RH: ABO/RH(D): O NEG

## 2014-05-11 MED ORDER — ACETAMINOPHEN 325 MG PO TABS
650.0000 mg | ORAL_TABLET | Freq: Once | ORAL | Status: AC
  Administered 2014-05-11: 650 mg via ORAL
  Filled 2014-05-11: qty 2

## 2014-05-11 MED ORDER — SODIUM CHLORIDE 0.9 % IV SOLN
Freq: Once | INTRAVENOUS | Status: AC
  Administered 2014-05-11: 14:00:00 via INTRAVENOUS

## 2014-05-11 MED ORDER — IOHEXOL 300 MG/ML  SOLN
100.0000 mL | Freq: Once | INTRAMUSCULAR | Status: AC | PRN
  Administered 2014-05-11: 100 mL via INTRAVENOUS

## 2014-05-11 MED ORDER — IOHEXOL 300 MG/ML  SOLN
25.0000 mL | INTRAMUSCULAR | Status: AC
  Administered 2014-05-11 (×2): 25 mL via ORAL

## 2014-05-11 NOTE — Progress Notes (Signed)
UR completed 

## 2014-05-11 NOTE — Progress Notes (Signed)
CRITICAL VALUE ALERT  Critical value received:  Hemoglobin 6.3  Date of notification:  05/11/14  Time of notification:  0730  Critical value read back:Yes.    Nurse who received alert:  Kathrine Cords, RN  MD notified (1st page):  Dr. Rosendo Gros   Time of first page:  [phone]  MD notified (2nd page):  Time of second page:  Responding MD:  Dr. Rosendo Gros  Time MD responded:  [phone]

## 2014-05-11 NOTE — Progress Notes (Signed)
1 Day Post-Op  Subjective: Pt doing well.  No abd pain.  Taking some clears.   Objective: Vital signs in last 24 hours: Temp:  [96.6 F (35.9 C)-98.7 F (37.1 C)] 98.4 F (36.9 C) (03/03 0648) Pulse Rate:  [88-113] 107 (03/03 0648) Resp:  [14-22] 15 (03/03 0648) BP: (77-129)/(48-79) 77/48 mmHg (03/03 0648) SpO2:  [97 %-100 %] 97 % (03/03 0648) Weight:  [120 lb (54.432 kg)] 120 lb (54.432 kg) (03/02 0955) Last BM Date: 05/09/14 (According to caretaker)  Intake/Output from previous day: 03/02 0701 - 03/03 0700 In: 2000 [I.V.:2000] Out: 895 [Urine:425; Drains:320; Blood:150] Intake/Output this shift:    General appearance: alert and cooperative GI: soft, approp ttp, JP SS  Lab Results:   Recent Labs  05/10/14 1911 05/11/14 0417  WBC 17.3* 15.5*  HGB 7.2* 6.3*  HCT 23.9* 20.7*  PLT 360 268   BMET  Recent Labs  05/10/14 1911 05/11/14 0417  NA  --  139  K  --  4.7  CL  --  107  CO2  --  20  GLUCOSE  --  146*  BUN  --  25*  CREATININE 1.27* 1.27*  CALCIUM  --  7.9*   PT/INR No results for input(s): LABPROT, INR in the last 72 hours. ABG No results for input(s): PHART, HCO3 in the last 72 hours.  Invalid input(s): PCO2, PO2  Studies/Results: No results found.  Anti-infectives: Anti-infectives    Start     Dose/Rate Route Frequency Ordered Stop   05/10/14 0600  ceFAZolin (ANCEF) IVPB 2 g/50 mL premix     2 g 100 mL/hr over 30 Minutes Intravenous On call to O.R. 05/09/14 1308 05/10/14 1140      Assessment/Plan: s/p Procedure(s): LAPAROSCOPIC CHOLECYSTECTOMY (N/A) DIAGNOSTIC LAPAROSCOPIC LIVER BIOPSY (N/A) Advance diet  CT Head/C/A/P CA 19-9 CEA Tx PRBC anema and tachycardia Hopefully home later today.      Rosario Jacks., Anne Hahn 05/11/2014

## 2014-05-11 NOTE — Progress Notes (Signed)
Marylyn Ishihara to be D/C'd Home with caregiver per MD order.  Discussed with the patient and all questions fully answered.  VSS. Surgical sites clean, dry, intact with dressings in place.   IV catheter discontinued intact. Site without signs and symptoms of complications. Dressing and pressure applied.  An After Visit Summary was printed and given to the patient. Patient received prescription for pain medication.  D/c education completed with patient/family including follow up instructions, medication list, d/c activities limitations if indicated, with other d/c instructions as indicated by MD - patient able to verbalize understanding, all questions fully answered.   Caregiver received teaching regarding JP drain care and verbalizes understanding.  Patient will schedule follow up appointment with [facility].  Patient instructed to return to ED, call 911, or call MD for any changes in condition.   Patient escorted via [place], and D/C home via private auto.  Micki Riley 05/11/2014 5:08 PM

## 2014-05-11 NOTE — Discharge Instructions (Signed)
[facility] ______[facility] °LAPAROSCOPIC SURGERY: POST OP INSTRUCTIONS °Always review your discharge instruction sheet given to you by the facility where your surgery was performed. °IF YOU HAVE DISABILITY OR FAMILY LEAVE FORMS, YOU MUST BRING THEM TO THE OFFICE FOR PROCESSING.   °DO NOT GIVE THEM TO YOUR DOCTOR. ° °1. A prescription for pain medication may be given to you upon discharge.  Take your pain medication as prescribed, if needed.  If narcotic pain medicine is not needed, then you may take acetaminophen (Tylenol) or ibuprofen (Advil) as needed. °2. Take your usually prescribed medications unless otherwise directed. °3. If you need a refill on your pain medication, please contact your pharmacy.  They will contact our office to request authorization. Prescriptions will not be filled after 5pm or on week-ends. °4. You should follow a light diet the first few days after arrival home, such as soup and crackers, etc.  Be sure to include lots of fluids daily. °5. Most patients will experience some swelling and bruising in the area of the incisions.  Ice packs will help.  Swelling and bruising can take several days to resolve.  °6. It is common to experience some constipation if taking pain medication after surgery.  Increasing fluid intake and taking a stool softener (such as Colace) will usually help or prevent this problem from occurring.  A mild laxative (Milk of Magnesia or Miralax) should be taken according to package instructions if there are no bowel movements after 48 hours. °7. Unless discharge instructions indicate otherwise, you may remove your bandages 24-48 hours after surgery, and you may shower at that time.  You may have steri-strips (small skin tapes) in place directly over the incision.  These strips should be left on the skin for 7-10 days.  If your surgeon used skin glue on the incision, you may shower in 24 hours.  The glue will flake off over the next 2-3 weeks.  Any sutures or  staples will be removed at the office during your follow-up visit. °8. ACTIVITIES:  You may resume regular (light) daily activities beginning the next day--such as daily self-care, walking, climbing stairs--gradually increasing activities as tolerated.  You may have sexual intercourse when it is comfortable.  Refrain from any heavy lifting or straining until approved by your doctor. °a. You may drive when you are no longer taking prescription pain medication, you can comfortably wear a seatbelt, and you can safely maneuver your car and apply brakes. °b. RETURN TO WORK:  __________________________________________________________ °9. You should see your doctor in the office for a follow-up appointment approximately 2-3 weeks after your surgery.  Make sure that you call for this appointment within a day or two after you arrive home to insure a convenient appointment time. °10. OTHER INSTRUCTIONS: __________________________________________________________________________________________________________________________ __________________________________________________________________________________________________________________________ °WHEN TO CALL YOUR DOCTOR: °1. Fever over 101.0 °2. Inability to urinate °3. Continued bleeding from incision. °4. Increased pain, redness, or drainage from the incision. °5. Increasing abdominal pain ° °The clinic staff is available to answer your questions during regular business hours.  Please don’t hesitate to call and ask to speak to one of the nurses for clinical concerns.  If you have a medical emergency, go to the nearest emergency room or call 911.  A surgeon from [facility] is always on call at the hospital. °[address], [address], [place], [place]  [address] ? [address] [address], [place], [place]   [address] °[phone] ? [phone] ? FAX [phone] °Web site:   [email or web address] °

## 2014-05-12 LAB — TYPE AND SCREEN
ABO/RH(D): O NEG
Antibody Screen: NEGATIVE
Unit division: 0

## 2014-05-12 LAB — CANCER ANTIGEN 19-9: CA 19-9: 146 U/mL — ABNORMAL HIGH (ref 0–35)

## 2014-05-12 LAB — CEA: CEA: 4.2 ng/mL (ref 0.0–4.7)

## 2014-05-12 NOTE — Discharge Summary (Signed)
Physician Discharge Summary  Patient ID: Sydney Hatfield MRN: [id number] DOB/AGE: 08-19-1955 59 y.o.  Admit date: 05/10/2014 Discharge date: 05/12/2014  Admission Diagnoses: s/p lap chole, autism  Discharge Diagnoses:  Active Problems:   Acute cholecystitis Liver masses  Discharged Condition: good  Hospital Course: Pt was admitted post op secondary to length and nature of surgery.  See op note for full details. Pt was started on PO pain meds and diet.  She tolerated that well.  She was otherwise are her baseline per her caretakers. JP drainage was SS Pt did undergo labs a CT scan workup. Pt was deemed stable and Dc'd home.  Consults: None  Significant Diagnostic Studies: radiology: CT scan: with liver masses  Treatments: surgery: as above  Discharge Exam: Blood pressure 103/58, pulse 120, temperature 98.5 F (36.9 C), temperature source Oral, resp. rate 15, height 5\' 1"  (1.549 m), weight 120 lb (54.432 kg), SpO2 96 %. General appearance: alert and cooperative GI: soft, non-tender; bowel sounds normal; no masses,  no organomegaly Incision/Wound: c/d/i, JP SS  Disposition: 01-Home or Self Care  Discharge Instructions    Diet - low sodium heart healthy    Complete by:  As directed      Increase activity slowly    Complete by:  As directed             Medication List    STOP taking these medications        amoxicillin-clavulanate 875-125 MG per tablet  Commonly known as:  AUGMENTIN     clindamycin 300 MG capsule  Commonly known as:  CLEOCIN     sucralfate 1 G tablet  Commonly known as:  CARAFATE      TAKE these medications        clonazePAM 0.5 MG tablet  Commonly known as:  KLONOPIN  Take 0.5-1 mg by mouth 4 (four) times daily. Takes 1 tablet every morning, 1 tablet daily at noon, 2 tablets at 4pm, and 1 tablet at 8pm.     divalproex 250 MG DR tablet  Commonly known as:  DEPAKOTE  Take 1,000 mg by mouth at bedtime.     ENSURE  Take 237 mLs by mouth 4  (four) times daily - after meals and at bedtime.     esomeprazole 40 MG capsule  Commonly known as:  NEXIUM  Take 40 mg by mouth 2 (two) times daily.     ferrous sulfate 325 (65 FE) MG tablet  Take 650 mg by mouth daily with breakfast.     nitrofurantoin 50 MG capsule  Commonly known as:  MACRODANTIN  Take 50 mg by mouth every evening.     ondansetron 4 MG tablet  Commonly known as:  ZOFRAN  Take 1 tablet (4 mg total) by mouth every 6 (six) hours.     oxyCODONE-acetaminophen 5-325 MG per tablet  Commonly known as:  PERCOCET/ROXICET  Take 1 tablet by mouth every 6 (six) hours as needed for moderate pain or severe pain.     prenatal multivitamin Tabs tablet  Take 1 tablet by mouth daily at 12 noon.     ranitidine 150 MG tablet  Commonly known as:  ZANTAC  Take 150 mg by mouth every evening.     sertraline 100 MG tablet  Commonly known as:  ZOLOFT  Take 200 mg by mouth every morning.     simvastatin 40 MG tablet  Commonly known as:  ZOCOR  Take 40 mg by mouth every evening.  Signed: Rosario Jacks., Anup Brigham 05/12/2014, 11:00 AM

## 2014-05-12 NOTE — Op Note (Signed)
05/10/2014  2:15 PM  PATIENT: Sydney Hatfield 59 y.o. 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  PROCEDURE: Procedure(s): LAPAROSCOPIC CHOLECYSTECTOMY (N/A)  Liver Core needle biopsy  SURGEON: Surgeon(s) and Role:  * [name], MD - Primary  * Michael Boston, MD - Assisting   ANESTHESIA: local and general  EBL:25cc  Total I/O In: 2000 [I.V.:2000] Out: 150 [Blood:150]  BLOOD ADMINISTERED:none  DRAINS: (18Fr) Jackson-Pratt drain(s) with closed bulb suction in the gallbladder fossa   LOCAL MEDICATIONS USED: BUPIVICAINE   SPECIMEN: Source of Specimen: gallbladder and liver Core needle biopsy  DISPOSITION OF SPECIMEN: PATHOLOGY  COUNTS: YES  TOURNIQUET: * No tourniquets in log *  DICTATION: .Dragon Dictation  The patient was consented she was taken back to the operating room and placed in the supine position with bilateral SCDs in place. After appropriate antibiotic for confirmatory timeout was called all facts verified. Patient was prepped and draped in sterile fashion.  At this time the Veress needle technique was used to insufflate the abdomen to 15 mmHg in the right subcostal margin. Subsequent to this a 5 mm trocar and camera then placed intra-abdominally. There is no injury to any intra-abdominal organs. At this time a 11 mm trocar was placed inferior to the umbilicus and direct visualization. The patient was position with reverse Trendelenburg and right side tilt. Athird 5 mm trochar was placed in the epigastrium under direct visualization. At this time the gallbladder is identified and seen to be chronically inflamed. There is large amount of omental adhesions to the gallbladder.  At this time I proceeded to clean off the omental adhesions from the fundus of the gallbladder. Secondary to the fullness of the gallbladder A Nezat suction irrigator was used  to empty the gallbladder. Upon initial attempt there was no drainage that could be removed from the gallbladder. Secondary to 2 tabs this drainage of the gallbladder was abandoned.  At this time I proceeded to slowly take down the adhesions to the gallbladder body from the surrounding omentum as well as the duodenum. I proceeded to clean down the gallbladder down to the neck of the gallbladder. However this was extremely inflamed and very friable. At this time I attempted a dome down approach. Secondary to friability of the wall of the gallbladder the gallbladder was entered. There is large amount of "cottage cheese" contents that were extruded. There is also some stones that escaped from the gallbladder. At this time I continued with dissection to attempt to find the cystic duct. An artery off of what appeared to be the right hepatic artery was seen going onto the gallbladder was clipped with 2 clips placed proximally 1 distally and transected. This allowed me retracted the gallbladder laterally. At this time secondary to minimal advancement in the case Dr. gross scrubbed in to assist.  At this time a fourth and fifth 5 mm trocar was then placed in the right subcostal margin. At this time we attempted to retract the gallbladder laterally and cephalad. With blunt dissection were able to narrow down the gallbladder body to the infundibulum. This likely was not cystic duct. This was circumferentially dissected away. At this time the epigastric port was upsized to a 12 mm port. At this time a 45 GIA stapler, blue load was used and to transect the infundibulum.it appeared to be a cystic artery posterior branch was clipped 2 proximally and one distally. It was then transected. At this time the  gallbladder was free from surrounding vasculature and structures. Left cautery was used to maintain hemostasis and dissection of the gallbladder off the hepatic fossa was undertaken. At this time a retrieval bag was placed  into the abdomen the gallbladder was placed into this. At this time we attempted to remove all stones had spilled. The cottage cheese material that had extruded was also placed in the bag. At this time we attempted to gain hemostasis of the hepatic fossa with electrocautery. Surgical site was then placed in the hepatic fossa and left in place. At this time a 58 [place] Blake drain was then brought into the abdomen and placed in the subhepatic fossa.  At this time proceeded to obtain a core needle biopsy of hepatic nodule. It should be noted that there were multiple hepatic nodules on the surface of the liver bilaterally. This was sent off to pathology for frozen specimen.  The subhepatic fossa was irrigated out until the effluent was serosanguineous.   At this time removed the 12 mm epigastric port and 0 Vicryl using interrupted fashion via an Endo Close device to reapproximate the fashion was placed. The 11 mm umbilical port was then removed and an 0 Vicryl was used via an Endo Close device to reapproximate the fascial . At this time of our trochars removed the skin was reapproximated all port sites using a 4-0 Monocryl subcuticular fashion patient tied the procedure well was taken to the recovery room in stable condition.   PLAN OF CARE: Admit for overnight observation  PATIENT DISPOSITION: PACU - hemodynamically stable.  Delay start of Pharmacological VTE agent (>24hrs) due to surgical blood loss or risk of bleeding: no

## 2014-05-18 ENCOUNTER — Ambulatory Visit (HOSPITAL_BASED_OUTPATIENT_CLINIC_OR_DEPARTMENT_OTHER): Payer: Medicare Other

## 2014-05-18 ENCOUNTER — Ambulatory Visit (HOSPITAL_COMMUNITY)
Admission: RE | Admit: 2014-05-18 | Discharge: 2014-05-18 | Disposition: A | Discharge status: Home / Self Care | Payer: Medicare Other | Source: Ambulatory Visit | Attending: Oncology | Admitting: Oncology

## 2014-05-18 ENCOUNTER — Ambulatory Visit (HOSPITAL_BASED_OUTPATIENT_CLINIC_OR_DEPARTMENT_OTHER): Payer: Medicare Other | Admitting: Oncology

## 2014-05-18 ENCOUNTER — Encounter: Payer: Self-pay | Admitting: Oncology

## 2014-05-18 ENCOUNTER — Other Ambulatory Visit: Payer: Self-pay | Admitting: *Deleted

## 2014-05-18 ENCOUNTER — Telehealth: Payer: Self-pay | Admitting: *Deleted

## 2014-05-18 ENCOUNTER — Telehealth: Payer: Self-pay | Admitting: Oncology

## 2014-05-18 ENCOUNTER — Ambulatory Visit: Payer: Medicare Other

## 2014-05-18 VITALS — BP 145/91 | HR 120 | Temp 98.9°F | Resp 18 | Ht 61.0 in

## 2014-05-18 DIAGNOSIS — C787 Secondary malignant neoplasm of liver and intrahepatic bile duct: Secondary | ICD-10-CM

## 2014-05-18 DIAGNOSIS — C23 Malignant neoplasm of gallbladder: Secondary | ICD-10-CM

## 2014-05-18 DIAGNOSIS — D638 Anemia in other chronic diseases classified elsewhere: Secondary | ICD-10-CM

## 2014-05-18 DIAGNOSIS — D649 Anemia, unspecified: Secondary | ICD-10-CM

## 2014-05-18 DIAGNOSIS — D72829 Elevated white blood cell count, unspecified: Secondary | ICD-10-CM

## 2014-05-18 HISTORY — DX: Malignant neoplasm of gallbladder: C23

## 2014-05-18 LAB — CBC WITH DIFFERENTIAL/PLATELET
BASO%: 0.4 % (ref 0.0–2.0)
Basophils Absolute: 0.1 10*3/uL (ref 0.0–0.1)
EOS%: 0.1 % (ref 0.0–7.0)
Eosinophils Absolute: 0 10*3/uL (ref 0.0–0.5)
HCT: 22 % — ABNORMAL LOW (ref 34.8–46.6)
HGB: 6.8 g/dL — CL (ref 11.6–15.9)
LYMPH%: 15.6 % (ref 14.0–49.7)
MCH: 26.4 pg (ref 25.1–34.0)
MCHC: 30.9 g/dL — AB (ref 31.5–36.0)
MCV: 85.3 fL (ref 79.5–101.0)
MONO#: 3.1 10*3/uL — AB (ref 0.1–0.9)
MONO%: 10.1 % (ref 0.0–14.0)
NEUT%: 73.8 % (ref 38.4–76.8)
NEUTROS ABS: 22.8 10*3/uL — AB (ref 1.5–6.5)
NRBC: 1 % — AB (ref 0–0)
Platelets: 218 10*3/uL (ref 145–400)
RBC: 2.58 10*6/uL — ABNORMAL LOW (ref 3.70–5.45)
RDW: 16.3 % — ABNORMAL HIGH (ref 11.2–14.5)
WBC: 30.8 10*3/uL — ABNORMAL HIGH (ref 3.9–10.3)
lymph#: 4.8 10*3/uL — ABNORMAL HIGH (ref 0.9–3.3)

## 2014-05-18 LAB — PREPARE RBC (CROSSMATCH)

## 2014-05-18 LAB — BASIC METABOLIC PANEL (CC13)
Anion Gap: 16 mEq/L — ABNORMAL HIGH (ref 3–11)
BUN: 33.3 mg/dL — ABNORMAL HIGH (ref 7.0–26.0)
CALCIUM: 8.5 mg/dL (ref 8.4–10.4)
CHLORIDE: 102 meq/L (ref 98–109)
CO2: 21 meq/L — AB (ref 22–29)
Creatinine: 1.3 mg/dL — ABNORMAL HIGH (ref 0.6–1.1)
EGFR: 44 mL/min/{1.73_m2} — AB (ref >90)
Glucose: 115 mg/dl (ref 70–140)
Potassium: 3.9 mEq/L (ref 3.5–5.1)
Sodium: 139 mEq/L (ref 136–145)

## 2014-05-18 LAB — TECHNOLOGIST REVIEW

## 2014-05-18 LAB — HOLD TUBE, BLOOD BANK

## 2014-05-18 MED ORDER — OXYCODONE-ACETAMINOPHEN 5-325 MG/5ML PO SOLN: 5.0000 mL | ORAL | Status: DC | PRN

## 2014-05-18 NOTE — Telephone Encounter (Signed)
Notified caregiver, Sydney Hatfield that Hgb is 6.8 and to keep blue armband on. Will transfuse at [facility] on Saturday 05/20/14 at 0830. No registration that day, just come in front and go down hall to treatment area on the left. Informed of elevation in WBC and to monitor her temp and call surgeon for fever > 101. Na+ level was normal. Faxed labs to Dr. Tanna Furry office for her visit tomorrow.

## 2014-05-18 NOTE — Progress Notes (Signed)
Per Levada Dy at [facility]- they will approve unlimited visits for patient with npi [phone]

## 2014-05-18 NOTE — Progress Notes (Signed)
Checked in new patient with no issues medicare a&b and medicaid [place]. She has not traveled and we are to call [facility]- she is the caregiver.

## 2014-05-18 NOTE — Progress Notes (Signed)
[facility] New Patient Consult   Referring MD: Hulan Fess, [place], [place] [address]   Sydney Hatfield 59 y.o.  [birth year]    Reason for Referral: Gallbladder cancer   HPI: Sydney Hatfield presents today with Sydney Hatfield family and multiple caretakers. They report Sydney Hatfield has a history of abdominal pain since November 2015. Sydney Hatfield was referred to Dr. Rosendo Gros. An ultrasound on 01/27/2014 revealed a distended gallbladder filled with debris/sludge and gallstones. A nuclear medicine hepatobiliary scan on 02/21/2014 revealed nonvisualization of the gallbladder. Sydney Hatfield was taken the operating room for a laparoscopic cholecystectomy on 05/10/2014. The gallbladder was inflamed and friable. Multiple hepatic nodules were noted on the surface of the liver. A core biopsy of one nodule was obtained.  The pathology [id number]) is confirmed invasive moderate to poorly give projected adenocarcinoma arising in a background of high-grade glandular dysplasia. 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 The morphology and immunophenotype were consistent with moderate to poorly differentiated adenocarcinoma the gallbladder.  Sydney Hatfield family report Sydney Hatfield was noted to be anemic prior to surgery and an upper endoscopy was planned. Sydney Hatfield was transfused with a unit of packed red blood cells on 05/11/2014.  No bleeding.   Past Medical History  Diagnosis Date  . Autistic spectrum disorder   . Anxiety disorder   . MR (mental retardation), moderate   . Torticollis   . Hx of migraines   .  hemorrhoids        . Stroke 2011    mini stroke  . Aspiration into lower respiratory tract     eating pizza first time, and second time drinking a Coke  . Pneumonia 2013 and 2014      . Depression   . Seizure     not in years  . Fibromyalgia   . Anemia     Past Surgical History  Procedure Laterality Date  . Colonoscopy    . Breast biopsy    . Wisdom  tooth extraction      age 67's  . Cholecystectomy N/A 05/10/2014    Procedure: LAPAROSCOPIC CHOLECYSTECTOMY;  Surgeon: [name], MD;  Location: [facility];  Service: General;  Laterality: N/A;  . Diagnostic laparoscopic liver biopsy N/A 05/10/2014    Procedure: DIAGNOSTIC LAPAROSCOPIC LIVER BIOPSY;  Surgeon: [name], MD;  Location: [facility];  Service: General;  Laterality: N/A;    Medications: Reviewed  Allergies:  Allergies  Allergen Reactions  . Carbamazepine Other (See Comments)    hyponatremia  . Sulfa Antibiotics Other (See Comments)    Unknown reaction  . Tessalon [Benzonatate] Other (See Comments)    dizziness    Family history: No family history of cancer  Social History:   Sydney Hatfield lives in [place]. Sydney Hatfield has around-the-clock caretakers. No tobacco or alcohol use. Sydney Hatfield caretaker reports Sydney Hatfield functions at the level of an adolescent.    ROS:   Positives include: Low-grade fever in the evening following discharge from the hospital, 25 pound weight loss since November 2015, anorexia, I reassess discharge from the hospital improved with Imodium, abdominal pain following surgery, and falls  A complete ROS was otherwise negative.  Physical Exam:  Blood pressure 145/91, pulse 120, temperature 98.9 F (37.2 C), temperature source Oral, resp. rate 18, height 5\' 1"  (1.549 m), weight 0 lb (0 kg), SpO2 97 %.  HEENT: The neck is flexed, Sydney Hatfield is unable to open Sydney Hatfield mouth for a complete examination, neck without mass  Lungs:  Lungs clear bilaterally, no respiratory distress Cardiac: Regular rate and rhythm Abdomen: No hepatomegaly, no apparent ascites, right upper quadrant drain with serosanguineous fluid, Steri-Strips at the surgical trocar sites  Vascular: No leg edema Lymph nodes: No cervical, supraclavicular, axillary, or inguinal nodes Neurologic: Alert, not oriented, follows simple commands, moves the arms and legs Skin: No rash Musculoskeletal: Mild diffuse spine  tenderness   LAB:  CBC  Lab Results  Component Value Date   WBC 30.8* 05/18/2014   HGB 6.8* 05/18/2014   HCT 22.0* 05/18/2014   MCV 85.3 05/18/2014   PLT 218 05/18/2014   NEUTROABS 22.8* 05/18/2014     CMP      Component Value Date/Time   NA 139 05/18/2014 1544   NA 139 05/11/2014 0417   K 3.9 05/18/2014 1544   K 4.7 05/11/2014 0417   CL 107 05/11/2014 0417   CO2 21* 05/18/2014 1544   CO2 20 05/11/2014 0417   GLUCOSE 115 05/18/2014 1544   GLUCOSE 146* 05/11/2014 0417   BUN 33.3* 05/18/2014 1544   BUN 25* 05/11/2014 0417   CREATININE 1.3* 05/18/2014 1544   CREATININE 1.27* 05/11/2014 0417   CALCIUM 8.5 05/18/2014 1544   CALCIUM 7.9* 05/11/2014 0417   PROT 5.7* 05/11/2014 0417   ALBUMIN 2.1* 05/11/2014 0417   AST 73* 05/11/2014 0417   ALT 29 05/11/2014 0417   ALKPHOS 75 05/11/2014 0417   BILITOT 0.4 05/11/2014 0417   GFRNONAA 46* 05/11/2014 0417   GFRAA 53* 05/11/2014 0417    Lab Results  Component Value Date   CEA 4.2 05/11/2014    Imaging: CT abdomen 05/11/2014-numerous liver masses, atelectasis, small pleural effusions, CT images were reviewed    Assessment/Plan:   1. Metastatic adenocarcinoma the gallbladder  Liver metastases confirmed at the time of surgery 05/10/2014 and on a postoperative CT 05/11/2014  Status post a cholecystectomy and liver biopsy 05/10/2014 confirming a moderate to poorly differentiated adenocarcinoma of the gallbladder  2.   Anemia-most likely anemia of chronic disease complicated by surgery  3.   Fever/leukocytosis-potentially related to the metastatic tumor versus an infection  4.   Mental retardation  5.   Torticollis   Disposition:   Sydney Hatfield has been diagnosed with metastatic adenocarcinoma of the gallbladder. I described the diagnostic findings to Sydney Hatfield, but I did not use the term cancer per the repeat request of Sydney Hatfield family. Sydney Hatfield has extensive liver metastases and a poor prognosis for a long-term disease-free  survival. I estimate Sydney Hatfield survival to be weeks to a few months. No therapy would be curative and it would be difficult to administer chemotherapy in Sydney Hatfield case. Sydney Hatfield appears to have significant mental retardation.  I recommend Hospice care. Sydney Hatfield family is in agreement. We began a discussion regarding CPR and ACLS issues. Sydney Hatfield family will discuss this further with Dr. Rex Kras and the Hospice team. I contacted Dr. Rex Kras and he will serve as the primary hospice physician.  Sydney Hatfield has a leukocytosis and the family reports Sydney Hatfield has had nighttime fever since discharge from the hospital. This may be related to the liver metastases or Sydney Hatfield may have an infection. Sydney Hatfield is scheduled to see Dr. Rosendo Gros tomorrow. I did not place Sydney Hatfield on antibiotics.  The severe anemia is most likely related to chronic disease and recent surgery. We will schedule a Red cell transfusion for palliation of symptoms related to anemia.  Sydney Hatfield is not scheduled for a follow-up appointment at the [facility]. I am available to see Sydney Hatfield in  the future as needed.  Williams, Phillips 05/18/2014, 4:49 PM

## 2014-05-18 NOTE — [facility] Oncology Navigator Note (Signed)
Met with patient, mother, sister and caregiver during new patient visit. Explained the role of the GI Nurse Navigator and provided New Patient Packet with information on: 1.  Gallbladder cancer 2.  Fall Safety Plan Discussed comfort care and provided support to family after visit. Family had requested that "cancer" or "chemotherapy" not be mentioned in her presence and this request was respected.  Merceda Elks, RN, BSN GI Oncology [place]

## 2014-05-18 NOTE — Telephone Encounter (Signed)
Gave avs. Sent patient to have labs. Follow up TBA.

## 2014-05-19 ENCOUNTER — Telehealth: Payer: Self-pay | Admitting: Oncology

## 2014-05-19 ENCOUNTER — Encounter: Payer: Self-pay | Admitting: Pharmacist

## 2014-05-19 ENCOUNTER — Telehealth: Payer: Self-pay | Admitting: *Deleted

## 2014-05-19 DIAGNOSIS — C23 Malignant neoplasm of gallbladder: Secondary | ICD-10-CM

## 2014-05-19 NOTE — Telephone Encounter (Signed)
VERBAL ORDER AND READ BACK TO DR.SHERRILL- MAY USE OXYCODONE 5MG /5ML WITH THE SAME DIRECTIONS AND QUANTITY. NOTIFIED [place]. HE WILL TAKE CARE OF PRESCRIPTION. DISCONTINUED THE OXYCODONE-ACETAMINOPHEN SOLUTION BUT WAS UNABLE TO SELECT THE OXYCODONE LIQUID. THE [facility] PHARMACIST, JESSIE, WILL PUT THE OXYCODONE LIQUID ON PT.'S MEDICATION LIST.

## 2014-05-19 NOTE — Telephone Encounter (Signed)
Chart delivered 05/19/14 °

## 2014-05-20 ENCOUNTER — Ambulatory Visit (HOSPITAL_BASED_OUTPATIENT_CLINIC_OR_DEPARTMENT_OTHER): Payer: Medicare Other

## 2014-05-20 VITALS — BP 109/59 | HR 108 | Temp 98.1°F | Resp 16

## 2014-05-20 DIAGNOSIS — D649 Anemia, unspecified: Secondary | ICD-10-CM

## 2014-05-20 MED ORDER — SODIUM CHLORIDE 0.9 % IV SOLN
250.0000 mL | Freq: Once | INTRAVENOUS | Status: AC
  Administered 2014-05-20: 250 mL via INTRAVENOUS

## 2014-05-20 NOTE — Progress Notes (Signed)
1230 Patient tolerating infusion.  No reaction noted. Rate increased to 185cc/hr.

## 2014-05-20 NOTE — Patient Instructions (Signed)

## 2014-05-21 LAB — TYPE AND SCREEN
ABO/RH(D): O NEG
Antibody Screen: NEGATIVE
UNIT DIVISION: 0
Unit division: 0

## 2014-05-22 ENCOUNTER — Emergency Department (HOSPITAL_COMMUNITY): Payer: Medicare Other

## 2014-05-22 ENCOUNTER — Inpatient Hospital Stay (HOSPITAL_COMMUNITY)
Admission: EM | Admit: 2014-05-22 | Discharge: 2014-05-24 | DRG: 871 | Disposition: A | Discharge status: Hospice | Payer: Medicare Other | Attending: Internal Medicine | Admitting: Internal Medicine

## 2014-05-22 ENCOUNTER — Other Ambulatory Visit: Payer: Self-pay

## 2014-05-22 ENCOUNTER — Encounter (HOSPITAL_COMMUNITY): Payer: Self-pay | Admitting: Internal Medicine

## 2014-05-22 DIAGNOSIS — G40909 Epilepsy, unspecified, not intractable, without status epilepticus: Secondary | ICD-10-CM | POA: Diagnosis present

## 2014-05-22 DIAGNOSIS — C23 Malignant neoplasm of gallbladder: Secondary | ICD-10-CM | POA: Diagnosis present

## 2014-05-22 DIAGNOSIS — Z79899 Other long term (current) drug therapy: Secondary | ICD-10-CM

## 2014-05-22 DIAGNOSIS — R0602 Shortness of breath: Secondary | ICD-10-CM

## 2014-05-22 DIAGNOSIS — Z515 Encounter for palliative care: Secondary | ICD-10-CM | POA: Diagnosis not present

## 2014-05-22 DIAGNOSIS — I959 Hypotension, unspecified: Secondary | ICD-10-CM | POA: Diagnosis present

## 2014-05-22 DIAGNOSIS — Y95 Nosocomial condition: Secondary | ICD-10-CM | POA: Diagnosis present

## 2014-05-22 DIAGNOSIS — R1310 Dysphagia, unspecified: Secondary | ICD-10-CM | POA: Diagnosis present

## 2014-05-22 DIAGNOSIS — A419 Sepsis, unspecified organism: Secondary | ICD-10-CM | POA: Diagnosis present

## 2014-05-22 DIAGNOSIS — F419 Anxiety disorder, unspecified: Secondary | ICD-10-CM | POA: Diagnosis present

## 2014-05-22 DIAGNOSIS — F840 Autistic disorder: Secondary | ICD-10-CM | POA: Diagnosis present

## 2014-05-22 DIAGNOSIS — J189 Pneumonia, unspecified organism: Secondary | ICD-10-CM

## 2014-05-22 DIAGNOSIS — C787 Secondary malignant neoplasm of liver and intrahepatic bile duct: Secondary | ICD-10-CM | POA: Diagnosis present

## 2014-05-22 DIAGNOSIS — D72829 Elevated white blood cell count, unspecified: Secondary | ICD-10-CM

## 2014-05-22 DIAGNOSIS — J690 Pneumonitis due to inhalation of food and vomit: Secondary | ICD-10-CM | POA: Diagnosis present

## 2014-05-22 DIAGNOSIS — J9600 Acute respiratory failure, unspecified whether with hypoxia or hypercapnia: Secondary | ICD-10-CM | POA: Diagnosis present

## 2014-05-22 DIAGNOSIS — F71 Moderate intellectual disabilities: Secondary | ICD-10-CM | POA: Diagnosis present

## 2014-05-22 DIAGNOSIS — J9601 Acute respiratory failure with hypoxia: Secondary | ICD-10-CM | POA: Diagnosis present

## 2014-05-22 DIAGNOSIS — Z66 Do not resuscitate: Secondary | ICD-10-CM | POA: Diagnosis present

## 2014-05-22 DIAGNOSIS — I9589 Other hypotension: Secondary | ICD-10-CM

## 2014-05-22 DIAGNOSIS — Z8673 Personal history of transient ischemic attack (TIA), and cerebral infarction without residual deficits: Secondary | ICD-10-CM

## 2014-05-22 DIAGNOSIS — R569 Unspecified convulsions: Secondary | ICD-10-CM

## 2014-05-22 HISTORY — DX: Malignant neoplasm of gallbladder: C23

## 2014-05-22 HISTORY — DX: Acute cholecystitis: K81.0

## 2014-05-22 HISTORY — DX: Pneumonitis due to inhalation of food and vomit: J69.0

## 2014-05-22 LAB — URINALYSIS, ROUTINE W REFLEX MICROSCOPIC
Bilirubin Urine: NEGATIVE
Glucose, UA: NEGATIVE mg/dL
KETONES UR: NEGATIVE mg/dL
Nitrite: NEGATIVE
PROTEIN: 30 mg/dL — AB
Specific Gravity, Urine: 1.02 (ref 1.005–1.030)
Urobilinogen, UA: 1 mg/dL (ref 0.0–1.0)
pH: 6 (ref 5.0–8.0)

## 2014-05-22 LAB — COMPREHENSIVE METABOLIC PANEL
ALBUMIN: 1.9 g/dL — AB (ref 3.5–5.2)
ALK PHOS: 211 U/L — AB (ref 39–117)
ALT: 24 U/L (ref 0–35)
AST: 46 U/L — ABNORMAL HIGH (ref 0–37)
Anion gap: 16 — ABNORMAL HIGH (ref 5–15)
BUN: 45 mg/dL — AB (ref 6–23)
CO2: 18 mmol/L — AB (ref 19–32)
Calcium: 8.4 mg/dL (ref 8.4–10.5)
Chloride: 103 mmol/L (ref 96–112)
Creatinine, Ser: 1.18 mg/dL — ABNORMAL HIGH (ref 0.50–1.10)
GFR calc Af Amer: 58 mL/min — ABNORMAL LOW (ref >90)
GFR calc non Af Amer: 50 mL/min — ABNORMAL LOW (ref >90)
GLUCOSE: 126 mg/dL — AB (ref 70–99)
Potassium: 4.5 mmol/L (ref 3.5–5.1)
SODIUM: 137 mmol/L (ref 135–145)
Total Bilirubin: 1 mg/dL (ref 0.3–1.2)
Total Protein: 6.1 g/dL (ref 6.0–8.3)

## 2014-05-22 LAB — URINE MICROSCOPIC-ADD ON

## 2014-05-22 LAB — CBC WITH DIFFERENTIAL/PLATELET
BASOS ABS: 0 10*3/uL (ref 0.0–0.1)
Basophils Relative: 0 % (ref 0–1)
EOS ABS: 0.2 10*3/uL (ref 0.0–0.7)
Eosinophils Relative: 1 % (ref 0–5)
HCT: 28.7 % — ABNORMAL LOW (ref 36.0–46.0)
Hemoglobin: 9 g/dL — ABNORMAL LOW (ref 12.0–15.0)
Lymphocytes Relative: 16 % (ref 12–46)
Lymphs Abs: 3 10*3/uL (ref 0.7–4.0)
MCH: 26.9 pg (ref 26.0–34.0)
MCHC: 31.4 g/dL (ref 30.0–36.0)
MCV: 85.7 fL (ref 78.0–100.0)
MONO ABS: 0.9 10*3/uL (ref 0.1–1.0)
Monocytes Relative: 5 % (ref 3–12)
NEUTROS PCT: 78 % — AB (ref 43–77)
Neutro Abs: 14.7 10*3/uL — ABNORMAL HIGH (ref 1.7–7.7)
Platelets: 184 10*3/uL (ref 150–400)
RBC: 3.35 MIL/uL — ABNORMAL LOW (ref 3.87–5.11)
RDW: 16.1 % — AB (ref 11.5–15.5)
WBC: 18.8 10*3/uL — AB (ref 4.0–10.5)

## 2014-05-22 LAB — TROPONIN I: Troponin I: <0.03 ng/mL (ref <0.031)

## 2014-05-22 LAB — BRAIN NATRIURETIC PEPTIDE: B Natriuretic Peptide: 212.5 pg/mL — ABNORMAL HIGH (ref 0.0–100.0)

## 2014-05-22 LAB — LIPASE, BLOOD: LIPASE: 29 U/L (ref 11–59)

## 2014-05-22 MED ORDER — VANCOMYCIN HCL IN DEXTROSE 1-5 GM/200ML-% IV SOLN
1000.0000 mg | INTRAVENOUS | Status: AC
  Administered 2014-05-22: 1000 mg via INTRAVENOUS
  Filled 2014-05-22: qty 200

## 2014-05-22 MED ORDER — SODIUM CHLORIDE 0.9 % IV SOLN
INTRAVENOUS | Status: AC
  Administered 2014-05-22: 18:00:00 via INTRAVENOUS

## 2014-05-22 MED ORDER — MORPHINE SULFATE 4 MG/ML IJ SOLN
4.0000 mg | INTRAMUSCULAR | Status: DC | PRN
  Administered 2014-05-22 – 2014-05-24 (×5): 4 mg via INTRAVENOUS
  Filled 2014-05-22 (×5): qty 1

## 2014-05-22 MED ORDER — PIPERACILLIN-TAZOBACTAM 3.375 G IVPB
3.3750 g | Freq: Three times a day (TID) | INTRAVENOUS | Status: DC
  Administered 2014-05-22: 3.375 g via INTRAVENOUS
  Filled 2014-05-22: qty 50

## 2014-05-22 MED ORDER — VANCOMYCIN HCL IN DEXTROSE 750-5 MG/150ML-% IV SOLN
750.0000 mg | INTRAVENOUS | Status: DC
Start: 2014-05-23 — End: 2014-05-23
  Filled 2014-05-22: qty 150

## 2014-05-22 MED ORDER — SODIUM CHLORIDE 0.9 % IV BOLUS (SEPSIS)
1000.0000 mL | INTRAVENOUS | Status: AC
  Administered 2014-05-22: 1000 mL via INTRAVENOUS

## 2014-05-22 MED ORDER — PIPERACILLIN-TAZOBACTAM 3.375 G IVPB 30 MIN
3.3750 g | INTRAVENOUS | Status: AC
  Administered 2014-05-22: 3.375 g via INTRAVENOUS
  Filled 2014-05-22: qty 50

## 2014-05-22 NOTE — ED Notes (Signed)
This Agricultural consultant spoke w/ [facility] and they will return w/ the Pt's MAR.

## 2014-05-22 NOTE — ED Notes (Signed)
Per ems pt from Group home, per ems pt co SOB started possibly last night. O2 sat 78 RA, pt received 2 nebs and solumedrol 125 IV. Pt ia alert and co abd pain. No emesis, no diarrhea reported. CBG 126. HX Liver and gall bladder cancer that per ems pt is not aware. Per ems family want pt DNR, yet has not sign it .

## 2014-05-22 NOTE — ED Notes (Signed)
Per Radene Ou, RN spoke with MD and advised will not down guard pt to tele; MS is concerned about soft BP and pt's family request for pressors if necessary.

## 2014-05-22 NOTE — ED Notes (Signed)
Bed: WA21 Expected date:  Expected time:  Means of arrival:  Comments: EMS- SOB, Hospice Pt

## 2014-05-22 NOTE — ED Notes (Signed)
Xray is in the room with patient I will collect labs when they finish.

## 2014-05-22 NOTE — ED Provider Notes (Signed)
CSN: [id number]     Arrival date & time 05/22/14  1305 History   First MD Initiated Contact with Patient 05/22/14 1316     Chief Complaint  Patient presents with  . Shortness of Breath     (Consider location/radiation/quality/duration/timing/severity/associated sxs/prior Treatment) HPI Comments: Patient presents to the ER for evaluation of difficulty breathing. Patient reportedly suddenly worsened overnight. She does have a history of aspirations. Patient brought to the ER by ambulance. Room air oxygen saturation was 70%. Patient has been given Solu-Medrol and multiple nebulizer treatments.  Patient is a 59 y.o. female presenting with shortness of breath.  Shortness of Breath Associated symptoms: cough     Past Medical History  Diagnosis Date  . Autistic spectrum disorder   . Anxiety disorder   . MR (mental retardation), moderate   . Torticollis   . Hx of migraines   . Complication of anesthesia     crying after anesthesia  . Stroke 2011    mini stroke  . Aspiration into lower respiratory tract     eating pizza first time, and second time drinking a Coke  . Pneumonia 2013 and 2014  . Anxiety   . Depression   . Seizure     not in years  . Fibromyalgia   . Anemia    Past Surgical History  Procedure Laterality Date  . Colonoscopy    . Breast biopsy    . Wisdom tooth extraction      age 57's  . Cholecystectomy N/A 05/10/2014    Procedure: LAPAROSCOPIC CHOLECYSTECTOMY;  Surgeon: [name], MD;  Location: [facility];  Service: General;  Laterality: N/A;  . Diagnostic laparoscopic liver biopsy N/A 05/10/2014    Procedure: DIAGNOSTIC LAPAROSCOPIC LIVER BIOPSY;  Surgeon: [name], MD;  Location: [facility];  Service: General;  Laterality: N/A;   Family History  Problem Relation Age of Onset  . Hypertension Mother   . Hypertension Father   . CAD Father    History  Substance Use Topics  . Smoking status: Never Smoker   . Smokeless tobacco: Never Used  . Alcohol Use: No    OB History    No data available     Review of Systems  Respiratory: Positive for cough and shortness of breath.   All other systems reviewed and are negative.     Allergies  Carbamazepine; Sulfa antibiotics; and Tessalon  Home Medications   Prior to Admission medications   Medication Sig Start Date End Date Taking? Authorizing Provider  acetaminophen (TYLENOL) 160 MG/5ML solution Take 15 mg/kg by mouth every 6 (six) hours as needed.   Yes Historical Provider, MD  clonazePAM (KLONOPIN) 0.5 MG tablet Take 0.5-1 mg by mouth 4 (four) times daily. Takes 1 tablet every morning, 1 tablet daily at noon, 2 tablets at 4pm, and 1 tablet at 8pm.   Yes Historical Provider, MD  divalproex (DEPAKOTE) 250 MG DR tablet Take 1,000 mg by mouth at bedtime.   Yes Historical Provider, MD  ENSURE (ENSURE) Take 237 mLs by mouth 4 (four) times daily - after meals and at bedtime.   Yes Historical Provider, MD  esomeprazole (NEXIUM) 40 MG capsule Take 40 mg by mouth 2 (two) times daily.    Yes Historical Provider, MD  ferrous sulfate 325 (65 FE) MG tablet Take 650 mg by mouth daily with breakfast.   Yes Historical Provider, MD  ibuprofen (ADVIL,MOTRIN) 100 MG/5ML suspension Take 200 mg by mouth every 4 (four) hours as needed.  Yes Historical Provider, MD  nitrofurantoin (MACRODANTIN) 50 MG capsule Take 50 mg by mouth every evening.    Yes Historical Provider, MD  oxyCODONE (ROXICODONE) 5 MG/5ML solution Take 5 mg by mouth every 4 (four) hours as needed for severe pain.   Yes Historical Provider, MD  Prenatal Vit-Fe Fumarate-FA (PRENATAL MULTIVITAMIN) TABS tablet Take 1 tablet by mouth daily at 12 noon.   Yes Historical Provider, MD  ranitidine (ZANTAC) 150 MG tablet Take 150 mg by mouth every evening.   Yes Historical Provider, MD  sertraline (ZOLOFT) 100 MG tablet Take 200 mg by mouth every morning.    Yes Historical Provider, MD  simvastatin (ZOCOR) 40 MG tablet Take 40 mg by mouth every evening.   Yes  Historical Provider, MD  ondansetron (ZOFRAN) 4 MG tablet Take 1 tablet (4 mg total) by mouth every 6 (six) hours. Patient not taking: Reported on 05/22/2014 01/27/14   Domenic Moras, PA-C   BP 89/55 mmHg  Pulse 117  Temp(Src) 100.2 F (37.9 C) (Rectal)  Resp 22  SpO2 98% Physical Exam  Constitutional: She is oriented to person, place, and time. She appears well-developed and well-nourished. No distress.  HENT:  Head: Normocephalic and atraumatic.  Right Ear: Hearing normal.  Left Ear: Hearing normal.  Nose: Nose normal.  Mouth/Throat: Oropharynx is clear and moist and mucous membranes are normal.  Eyes: Conjunctivae and EOM are normal. Pupils are equal, round, and reactive to light.  Neck: Normal range of motion. Neck supple.  Cardiovascular: Regular rhythm, S1 normal and S2 normal.  Exam reveals no gallop and no friction rub.   No murmur heard. Pulmonary/Chest: Effort normal. No respiratory distress. She has decreased breath sounds. She has rhonchi. She exhibits no tenderness.  Abdominal: Soft. Normal appearance and bowel sounds are normal. There is no hepatosplenomegaly. There is no tenderness. There is no rebound, no guarding, no tenderness at McBurney's point and negative Murphy's sign. No hernia.  Musculoskeletal: Normal range of motion.  Neurological: She is alert and oriented to person, place, and time. She has normal strength. No cranial nerve deficit or sensory deficit. Coordination normal. GCS eye subscore is 4. GCS verbal subscore is 5. GCS motor subscore is 6.  Skin: Skin is warm, dry and intact. No rash noted. No cyanosis.  Psychiatric: She has a normal mood and affect. Her speech is normal and behavior is normal. Thought content normal.  Nursing note and vitals reviewed.   ED Course  Procedures (including critical care time) Labs Review Labs Reviewed  CBC WITH DIFFERENTIAL/PLATELET - Abnormal; Notable for the following:    WBC 18.8 (*)    RBC 3.35 (*)    Hemoglobin  9.0 (*)    HCT 28.7 (*)    RDW 16.1 (*)    Neutrophils Relative % 78 (*)    Neutro Abs 14.7 (*)    All other components within normal limits  COMPREHENSIVE METABOLIC PANEL - Abnormal; Notable for the following:    CO2 18 (*)    Glucose, Bld 126 (*)    BUN 45 (*)    Creatinine, Ser 1.18 (*)    Albumin 1.9 (*)    AST 46 (*)    Alkaline Phosphatase 211 (*)    GFR calc non Af Amer 50 (*)    GFR calc Af Amer 58 (*)    Anion gap 16 (*)    All other components within normal limits  BRAIN NATRIURETIC PEPTIDE - Abnormal; Notable for the following:  B Natriuretic Peptide 212.5 (*)    All other components within normal limits  CULTURE, BLOOD (ROUTINE X 2)  CULTURE, BLOOD (ROUTINE X 2)  URINE CULTURE  LIPASE, BLOOD  TROPONIN I  URINALYSIS, ROUTINE W REFLEX MICROSCOPIC    Imaging Review Dg Chest 1 View  05/22/2014   CLINICAL DATA:  Shortness of breath, respiratory distress weakness beginning 05/21/2014.  EXAM: CHEST  1 VIEW  COMPARISON:  CT chest 05/11/2014.  PA and lateral chest 01/27/2014.  FINDINGS: Bilateral airspace disease is worse on the left. Heart size is normal. No pneumothorax or pleural effusion.  IMPRESSION: Left worse than right airspace disease most worrisome for pneumonia.   Electronically Signed   By: Inge Rise M.D.   On: 05/22/2014 13:48     EKG Interpretation   Date/Time:  Monday May 22 2014 13:09:20 EDT Ventricular Rate:  124 PR Interval:  107 QRS Duration: 90 QT Interval:  298 QTC Calculation: 428 R Axis:   70 Text Interpretation:  Sinus tachycardia Minimal ST depression, inferior  leads Baseline wander in lead(s) V5 No significant change since last  tracing Confirmed by POLLINA  MD, CHRISTOPHER [phone]) on 05/22/2014 2:45:32  PM        MDM   Final diagnoses:  Shortness of breath  HCAP (healthcare-associated pneumonia)  Sepsis, due to unspecified organism    Patient presents to the ER for evaluation of difficulty breathing. Symptoms began  in the last 24 hours. She was found to have low-grade fever here in the ER. Chest x-ray shows bilateral pneumonia. She does have a history of aspiration pneumonia in the past. She also has a recent hospitalization. During that hospitalization, she was to undergo cholecystectomy, but it was discovered that she has cancer in her liver. She was to be evaluated by hospice today. The visiting hospice nurse noted that she looked ill, center to the ER for further evaluation.  I did have a lengthy conversation with her family including power of attorney. She will be a DO NOT RESUSCITATE, no CPR, no intubation. She can get IV fluids, antibiotics, etc. Patient will require hospitalization for further management of acute pneumonia and sepsis.    Orpah Greek, MD 05/22/14 [phone]

## 2014-05-22 NOTE — Progress Notes (Signed)
ANTIBIOTIC CONSULT NOTE - INITIAL  Pharmacy Consult for vancomycin/zosyn Indication: rule out pneumonia  Allergies  Allergen Reactions  . Carbamazepine Other (See Comments)    hyponatremia  . Sulfa Antibiotics Other (See Comments)    Unknown reaction  . Tessalon [Benzonatate] Other (See Comments)    dizziness    Patient Measurements:   Adjusted Body Weight:   Vital Signs: Temp: 100.2 F (37.9 C) (03/14 1357) Temp Source: Rectal (03/14 1357) BP: 106/87 mmHg (03/14 1315) Pulse Rate: 125 (03/14 1315) Intake/Output from previous day:   Intake/Output from this shift:    Labs: No results for input(s): WBC, HGB, PLT, LABCREA, CREATININE in the last 72 hours. Estimated Creatinine Clearance: 35.6 mL/min (by C-G formula based on Cr of 1.3). No results for input(s): VANCOTROUGH, VANCOPEAK, VANCORANDOM, GENTTROUGH, GENTPEAK, GENTRANDOM, TOBRATROUGH, TOBRAPEAK, TOBRARND, AMIKACINPEAK, AMIKACINTROU, AMIKACIN in the last 72 hours.   Microbiology: Recent Results (from the past 720 hour(s))  TECHNOLOGIST REVIEW     Status: None   Collection Time: 05/18/14  3:43 PM  Result Value Ref Range Status   Technologist Review Occ. Metas and Myelocytes present, few ovalos  Final    Medical History: Past Medical History  Diagnosis Date  . Autistic spectrum disorder   . Anxiety disorder   . MR (mental retardation), moderate   . Torticollis   . Hx of migraines   . Complication of anesthesia     crying after anesthesia  . Stroke 2011    mini stroke  . Aspiration into lower respiratory tract     eating pizza first time, and second time drinking a Coke  . Pneumonia 2013 and 2014  . Anxiety   . Depression   . Seizure     not in years  . Fibromyalgia   . Anemia    Assessment: 83 YOF brought to [facility] from group home with shortness of breath.  She was recently admitted at [facility] for cholecystectomy 3/2, it was noted that the liver had multiple nodules.  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 Orders to start vancomycin and zosyn for pneumonia (Possible L-sided PNA per CXR)  3/14 >> vancomycin  >> 3/14 >>zosyn >>    Tmax: 100.2 WBCs: elevated Renal: SCr = 1.3 (3/10), normalized CrCl = 53ml/min  3/14 blood:  Drug level / dose changes info: 03/30/12 VT = 25.9 on vancomycin 1gm IV q12h  (SCr ~1)  Goal of Therapy:  Vancomycin trough level 15-20 mcg/ml  Plan:   Vancomycin 1gm X 1 then 750mg  IV q24h  Check vancomycin trough   Zosyn 3.375gm over 7min x1 then 3.375gm IV q8h over 4h infusion  Follow renal function and culture results  Doreene Eland, PharmD, BCPS.   Pager: [phone]  05/22/2014,2:18 PM

## 2014-05-22 NOTE — H&P (Addendum)
History and Physical:    Sydney Hatfield LEX:[id number] DOB: Mar 13, 1955 DOA: 05/22/2014  Referring physician: Orpah Greek, MD PCP: Gennette Pac, MD   Chief Complaint: Dyspnea  History of Present Illness:   Sydney Hatfield is an 59 y.o. female with a PMH of MR, aspiration, torticollis and subacute abdominal pain s/p ultrasound on 01/27/2014 which revealed a distended gallbladder filled with debris/sludge and gallstones. A nuclear medicine hepatobiliary scan on 02/21/2014 revealed nonvisualization of the gallbladder.  She was taken the operating room for a laparoscopic cholecystectomy on 05/10/2014. The gallbladder was inflamed and friable. Multiple hepatic nodules were noted on the surface of the liver. A core biopsy of one nodule was obtained. 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 She was seen by Dr. Benay Spice in follow up & felt to have poor prognosis for  long-term disease-free survival with an estimation of her survival as weeks to a few months, with no recommendations for chemo given prognosis.  She was referred to Hospice.  The hospice RN referred the patient to the ED for evaluation of a 24 hour history of worsening dyspnea and increased WOB.  Room air oxygen saturation was 70%. She has not ambulated since her surgery on 05/10/14. The EDP did have a lengthy conversation with her family including power of attorney. She will be a DO NOT RESUSCITATE, no CPR, no intubation. She can get IV fluids, antibiotics, etc.  She has not ambulated since her surgery on 05/10/14.  ROS:   Constitutional: + fever, no chills;  Appetite diminished; + weight loss, no weight gain, + fatigue.  HEENT: No blurry vision, no diplopia, no pharyngitis, no dysphagia CV: No chest pain, no palpitations, no PND, no orthopnea, no edema.   Resp: No SOB, no cough, no pleuritic pain. GI: No nausea, no vomiting, + diarrhea, no melena, no hematochezia, no constipation, no abdominal pain, +hemorrhoids.  GU: No dysuria, no hematuria, no frequency, no urgency. MSK: no myalgias, no arthralgias.  Neuro:  + headache, no focal neurological deficits, no history of seizures.  Psych: No depression, no anxiety.  Endo: No heat intolerance, no cold intolerance, no polyuria, no polydipsia  Skin: No rashes, no skin lesions.  Heme: No easy bruising.  Travel history: No recent travel.   Past Medical History:   Past Medical History  Diagnosis Date  . Autistic spectrum disorder   . Anxiety disorder   . MR (mental retardation), moderate   . Torticollis   . Hx of migraines   . Complication of anesthesia     crying after anesthesia  . Stroke 2011    mini stroke  . Aspiration into lower respiratory tract     eating pizza first time, and second time drinking a Coke  . Pneumonia 2013 and 2014  . Anxiety   . Depression   . Seizure     not in years  . Fibromyalgia   . Anemia   . Acute cholecystitis 05/10/2014  . Aspiration pneumonitis 11/24/2013  . 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 Colonoscopy    . Breast biopsy    . Wisdom tooth extraction      age 42's  . Cholecystectomy N/A 05/10/2014    Procedure: LAPAROSCOPIC CHOLECYSTECTOMY;  Surgeon: [name], MD;  Location: [place];  Service: General;  Laterality: N/A;  . Diagnostic laparoscopic liver biopsy N/A 05/10/2014    Procedure: DIAGNOSTIC LAPAROSCOPIC LIVER BIOPSY;  Surgeon: [name], MD;  Location: [place];  Service: General;  Laterality: N/A;    Social History:   History   Social History  . Marital Status: Single    Spouse Name: N/A  . Number of Children: N/A  . Years of Education: N/A   Occupational History  . Not on file.   Social History Main Topics  . Smoking status: Never Smoker   .  Smokeless tobacco: Never Used  . Alcohol Use: No  . Drug Use: No  . Sexual Activity: No   Other Topics Concern  . Not on file   Social History Narrative   Resident of a group home.  Ambulated independently prior to surgery.    Family history:   Family History  Problem Relation Age of Onset  . Hypertension Mother   . Hypertension Father   . CAD Father     Allergies   Carbamazepine; Sulfa antibiotics; and Tessalon  Current Medications:   Prior to Admission medications   Medication Sig Start Date End Date Taking? Authorizing Provider  acetaminophen (TYLENOL) 160 MG/5ML solution Take 15 mg/kg by mouth every 6 (six) hours as needed.   Yes Historical Provider, MD  clonazePAM (KLONOPIN) 0.5 MG tablet Take 0.5-1 mg by mouth 4 (four) times daily. Takes 1 tablet every morning, 1 tablet daily at noon, 2 tablets at 4pm, and 1 tablet at 8pm.   Yes Historical Provider, MD  divalproex (DEPAKOTE) 250 MG DR tablet Take 1,000 mg by mouth at bedtime.   Yes Historical Provider, MD  ENSURE (ENSURE) Take 237 mLs by mouth 4 (four) times daily - after meals and at bedtime.   Yes Historical Provider, MD  esomeprazole (NEXIUM) 40 MG capsule Take 40 mg by mouth 2 (two) times daily.    Yes Historical Provider, MD  ferrous sulfate 325 (65 FE) MG tablet Take 650 mg by mouth daily with breakfast.   Yes Historical Provider, MD  ibuprofen (ADVIL,MOTRIN) 100 MG/5ML suspension Take 200 mg by mouth every 4 (four) hours as needed.   Yes Historical Provider, MD  nitrofurantoin (MACRODANTIN) 50 MG capsule Take 50 mg by mouth every evening.    Yes Historical Provider, MD  oxyCODONE (ROXICODONE) 5 MG/5ML solution Take 5 mg by mouth every 4 (four) hours as needed for severe pain.   Yes Historical Provider, MD  Prenatal Vit-Fe Fumarate-FA (PRENATAL MULTIVITAMIN) TABS tablet Take 1 tablet by mouth daily at 12 noon.   Yes Historical Provider, MD  ranitidine (ZANTAC) 150 MG tablet Take 150 mg by mouth every evening.   Yes  Historical Provider, MD  sertraline (ZOLOFT) 100 MG tablet Take 200 mg by mouth every morning.    Yes Historical Provider, MD  simvastatin (ZOCOR) 40 MG tablet Take 40 mg by mouth every evening.   Yes Historical Provider, MD  ondansetron (ZOFRAN) 4 MG tablet Take 1 tablet (4 mg total) by mouth every 6 (six) hours. Patient not taking: Reported on 05/22/2014 01/27/14   Domenic Moras, PA-C    Physical Exam:   Filed Vitals:   05/22/14 1649 05/22/14 1700 05/22/14 1730 05/22/14 1735  BP: 101/64 101/64 102/69 102/69  Pulse: 112 120  112  Temp:      TempSrc:      Resp: 22 23 21 26   SpO2: 96% 93%  96%     Physical Exam: Blood pressure 102/69, pulse 112, temperature  100.2 F (37.9 C), temperature source Rectal, resp. rate 26, SpO2 96 %. Gen: Mild acute distress. Restless. Head: Normocephalic, atraumatic. Eyes: PERRL, EOMI, sclerae nonicteric. Mouth: Oropharynx grossly clear but patient has 100% NRM and is restless. Neck: Supple, no thyromegaly, no lymphadenopathy, no jugular venous distention. Chest: Lungs decreased. CV: Heart sounds are tachycardic/regular. No M/R/G. Abdomen: Soft, nontender, nondistended with normal active bowel sounds. Extremities: Extremities are without C/E/C. Skin: Warm and dry. Neuro: Alert but disoriented; cranial nerves II through XII grossly intact. Psych: Mood and affect anxious.   Data Review:    Labs: Basic Metabolic Panel:  Recent Labs Lab 05/18/14 1544 05/22/14 1353  NA 139 137  K 3.9 4.5  CL  --  103  CO2 21* 18*  GLUCOSE 115 126*  BUN 33.3* 45*  CREATININE 1.3* 1.18*  CALCIUM 8.5 8.4   Liver Function Tests:  Recent Labs Lab 05/22/14 1353  AST 46*  ALT 24  ALKPHOS 211*  BILITOT 1.0  PROT 6.1  ALBUMIN 1.9*    Recent Labs Lab 05/22/14 1353  LIPASE 29   No results for input(s): AMMONIA in the last 168 hours. CBC:  Recent Labs Lab 05/18/14 1543 05/22/14 1353  WBC 30.8* 18.8*  NEUTROABS 22.8* 14.7*  HGB 6.8* 9.0*  HCT  22.0* 28.7*  MCV 85.3 85.7  PLT 218 184   Cardiac Enzymes:  Recent Labs Lab 05/22/14 1353  TROPONINI <0.03     Radiographic Studies: Dg Chest 1 View  05/22/2014   CLINICAL DATA:  Shortness of breath, respiratory distress weakness beginning 05/21/2014.  EXAM: CHEST  1 VIEW  COMPARISON:  CT chest 05/11/2014.  PA and lateral chest 01/27/2014.  FINDINGS: Bilateral airspace disease is worse on the left. Heart size is normal. No pneumothorax or pleural effusion.  IMPRESSION: Left worse than right airspace disease most worrisome for pneumonia.   Electronically Signed   By: Inge Rise M.D.   On: 05/22/2014 13:48    EKG: Independently reviewed. Sinus tachycardia at 124 bpm.   Assessment/Plan:   Principal Problem:   Sepsis secondary to aspiration pneumonia, worrisome for MRSA or pseudomonas given recent hospitalization / hypotension / leukocytosis. -Patient meets criteria for sepsis -Source thought to be from aspiration/hospital acquired pneumonia given chronic dysphagia. -Start aggressive fluid resuscitation with NS. -Start broad spectrum antibiotics with Cefepime/Vancomycin. - Blood, urine cultures ordered.  Lactic acid/procalcitonin ordered.  Active Problems:   Seizure disorder - Continue depakote.    Acute respiratory failure - Secondary to HCAP.  100% NRM. Morphine PRN air hunger.    Autistic spectrum disorder/Anxiety disorder/MR (mental retardation), moderate - Continue Klonopin and Zoloft.    Dysphagia - ST evaluation, diet per recommendations.    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 - Palliative care consultation requested. - Limited code. - Morphine and oxycodone as needed for pain. - Mother does not want the patient's diagnosis revealed to her given MR/autism.   DVT prophylaxis - Lovenox ordered.  Code Status: Limited: No CPR/intubation. Family Communication: Mother, Sydney Hatfield, [phone]. Disposition Plan: Home when  stable.  Time spent: 1 hour.  RAMA,CHRISTINA  [facility] Pager [phone] Cell: [phone]   If 7PM-7AM, please contact night-coverage [email or web address] Password [facility] 05/22/2014, 5:45 PM

## 2014-05-22 NOTE — Progress Notes (Signed)
  CARE MANAGEMENT ED NOTE 05/22/2014  Patient:  KAELEE, PFEFFER   Account Number:  [id number]  Date Initiated:  05/22/2014  Documentation initiated by:  Livia Snellen  Subjective/Objective Assessment:   Patient presents to Ed with shortness of breath with room air saturation of 70%.     Subjective/Objective Assessment Detail:   Patient with pmhx of MR, autism  Chest x-ray shows bilateral pneumonia.  Patient wearing 12 liters via mask     Action/Plan:   Action/Plan Detail:   Anticipated DC Date:       Status Recommendation to Physician:   Result of Recommendation:    Other ED [place]  Other    Choice offered to / List presented to:            Status of service:  Completed, signed off  ED Comments:   ED Comments Detail:  EDCM spoke to patient and her mother Victory Dresden [phone] at bedside.  Patient's mother reports patient lives in a group home called [facility] in [place].  Patient's caretaker there is Franklyn Lor [phone].  Patient's mother reports patient was supposed to be evaluated today by Hospice and [place], but hospice RN Pam sent patient to the ED.  Patient is NOT active with hospice at this time. Patient has a wheelchiar, walker, and shower chair at home. Patient's mother expressed interest in hospital bed for patient.  Patient's mother confirms patient's pcp is Dr. Hulan Fess with [facility].  Patient's mother reports patient has be seen by Dr. Learta Codding this past Thursday.  Patent's mother does not know ifpatient received a follow up phone call as patient lives in a group home. This informstion was placed in readmission focus note.  No further EDCM needs at this time.

## 2014-05-23 ENCOUNTER — Encounter (HOSPITAL_COMMUNITY): Payer: Self-pay | Admitting: *Deleted

## 2014-05-23 LAB — BASIC METABOLIC PANEL
Anion gap: 9 (ref 5–15)
BUN: 25 mg/dL — AB (ref 6–23)
CHLORIDE: 103 mmol/L (ref 96–112)
CO2: 25 mmol/L (ref 19–32)
Calcium: 7.9 mg/dL — ABNORMAL LOW (ref 8.4–10.5)
Creatinine, Ser: 0.63 mg/dL (ref 0.50–1.10)
GFR calc Af Amer: >90 mL/min (ref >90)
GFR calc non Af Amer: >90 mL/min (ref >90)
GLUCOSE: 85 mg/dL (ref 70–99)
POTASSIUM: 4.7 mmol/L (ref 3.5–5.1)
Sodium: 137 mmol/L (ref 135–145)

## 2014-05-23 LAB — URINE CULTURE
COLONY COUNT: NO GROWTH
CULTURE: NO GROWTH

## 2014-05-23 LAB — CBC
HEMATOCRIT: 25.6 % — AB (ref 36.0–46.0)
Hemoglobin: 8.3 g/dL — ABNORMAL LOW (ref 12.0–15.0)
MCH: 27.7 pg (ref 26.0–34.0)
MCHC: 32.4 g/dL (ref 30.0–36.0)
MCV: 85.3 fL (ref 78.0–100.0)
PLATELETS: 146 10*3/uL — AB (ref 150–400)
RBC: 3 MIL/uL — ABNORMAL LOW (ref 3.87–5.11)
RDW: 15.8 % — AB (ref 11.5–15.5)
WBC: 16.8 10*3/uL — AB (ref 4.0–10.5)

## 2014-05-23 LAB — APTT: aPTT: 36 seconds (ref 24–37)

## 2014-05-23 LAB — LACTIC ACID, PLASMA
Lactic Acid, Venous: 1.9 mmol/L (ref 0.5–2.0)
Lactic Acid, Venous: 1.5 mmol/L (ref 0.5–2.0)

## 2014-05-23 LAB — PROCALCITONIN: Procalcitonin: 3.18 ng/mL

## 2014-05-23 LAB — PROTIME-INR
INR: 1.72 — ABNORMAL HIGH (ref 0.00–1.49)
Prothrombin Time: 20.3 seconds — ABNORMAL HIGH (ref 11.6–15.2)

## 2014-05-23 MED ORDER — SODIUM CHLORIDE 0.9 % IJ SOLN
3.0000 mL | Freq: Two times a day (BID) | INTRAMUSCULAR | Status: DC
Start: 2014-05-23 — End: 2014-05-24

## 2014-05-23 MED ORDER — DEXTROSE 5 % IV SOLN: 2.0000 g | Freq: Once | INTRAVENOUS | Status: DC

## 2014-05-23 MED ORDER — CLONAZEPAM 0.5 MG PO TABS: 0.5000 mg | ORAL_TABLET | Freq: Four times a day (QID) | ORAL | Status: DC

## 2014-05-23 MED ORDER — LEVOFLOXACIN 750 MG PO TABS
750.0000 mg | ORAL_TABLET | Freq: Every day | ORAL | Status: DC
  Administered 2014-05-23 – 2014-05-24 (×2): 750 mg via ORAL
  Filled 2014-05-23 (×3): qty 1

## 2014-05-23 MED ORDER — CLONAZEPAM 0.5 MG PO TABS
0.5000 mg | ORAL_TABLET | ORAL | Status: DC
  Administered 2014-05-23 – 2014-05-24 (×3): 0.5 mg via ORAL
  Filled 2014-05-23 (×4): qty 1

## 2014-05-23 MED ORDER — SODIUM CHLORIDE 0.9 % IV BOLUS (SEPSIS): 1000.0000 mL | INTRAVENOUS | Status: DC

## 2014-05-23 MED ORDER — CLONAZEPAM 1 MG PO TABS
1.0000 mg | ORAL_TABLET | ORAL | Status: DC
  Administered 2014-05-23: 1 mg via ORAL
  Filled 2014-05-23: qty 1

## 2014-05-23 MED ORDER — HYDROCORTISONE ACETATE 25 MG RE SUPP
25.0000 mg | Freq: Two times a day (BID) | RECTAL | Status: DC
  Administered 2014-05-23 – 2014-05-24 (×3): 25 mg via RECTAL
  Filled 2014-05-23 (×4): qty 1

## 2014-05-23 MED ORDER — OXYCODONE HCL 5 MG/5ML PO SOLN
5.0000 mg | ORAL | Status: DC | PRN
  Administered 2014-05-23 – 2014-05-24 (×5): 5 mg via ORAL
  Filled 2014-05-23 (×5): qty 5

## 2014-05-23 MED ORDER — VANCOMYCIN HCL 500 MG IV SOLR
500.0000 mg | Freq: Two times a day (BID) | INTRAVENOUS | Status: DC
  Administered 2014-05-23: 500 mg via INTRAVENOUS
  Filled 2014-05-23: qty 500

## 2014-05-23 MED ORDER — DEXTROSE 5 % IV SOLN
1.0000 g | Freq: Two times a day (BID) | INTRAVENOUS | Status: DC
  Administered 2014-05-23: 1 g via INTRAVENOUS
  Filled 2014-05-23 (×2): qty 1

## 2014-05-23 MED ORDER — SERTRALINE HCL 100 MG PO TABS
200.0000 mg | ORAL_TABLET | Freq: Every morning | ORAL | Status: DC
  Administered 2014-05-23 – 2014-05-24 (×2): 200 mg via ORAL
  Filled 2014-05-23 (×2): qty 2

## 2014-05-23 MED ORDER — OXYCODONE HCL 5 MG/5ML PO SOLN: 5.0000 mg | ORAL | Status: DC | PRN

## 2014-05-23 MED ORDER — DIVALPROEX SODIUM 500 MG PO DR TAB
1000.0000 mg | DELAYED_RELEASE_TABLET | Freq: Every day | ORAL | Status: DC
  Administered 2014-05-23: 1000 mg via ORAL
  Filled 2014-05-23 (×2): qty 2

## 2014-05-23 MED ORDER — ENSURE PUDDING PO PUDG
1.0000 | Freq: Three times a day (TID) | ORAL | Status: DC
  Administered 2014-05-23 – 2014-05-24 (×3): 1 via ORAL
  Filled 2014-05-23 (×6): qty 1

## 2014-05-23 MED ORDER — VANCOMYCIN HCL IN DEXTROSE 1-5 GM/200ML-% IV SOLN: 1000.0000 mg | Freq: Once | INTRAVENOUS | Status: DC

## 2014-05-23 MED ORDER — PANTOPRAZOLE SODIUM 40 MG IV SOLR
40.0000 mg | INTRAVENOUS | Status: DC
  Administered 2014-05-23 – 2014-05-24 (×2): 40 mg via INTRAVENOUS
  Filled 2014-05-23 (×2): qty 40

## 2014-05-23 MED ORDER — ACETAMINOPHEN 160 MG/5ML PO SOLN: 650.0000 mg | Freq: Four times a day (QID) | ORAL | Status: DC | PRN

## 2014-05-23 MED ORDER — PIPERACILLIN-TAZOBACTAM 3.375 G IVPB
3.3750 g | Freq: Three times a day (TID) | INTRAVENOUS | Status: DC
  Filled 2014-05-23: qty 50

## 2014-05-23 MED ORDER — ONDANSETRON HCL 4 MG/2ML IJ SOLN: 4.0000 mg | Freq: Four times a day (QID) | INTRAMUSCULAR | Status: DC | PRN

## 2014-05-23 MED ORDER — ONDANSETRON HCL 4 MG PO TABS: 4.0000 mg | ORAL_TABLET | Freq: Four times a day (QID) | ORAL | Status: DC | PRN

## 2014-05-23 MED ORDER — ENOXAPARIN SODIUM 40 MG/0.4ML ~~LOC~~ SOLN
40.0000 mg | Freq: Every day | SUBCUTANEOUS | Status: DC
  Administered 2014-05-23 – 2014-05-24 (×2): 40 mg via SUBCUTANEOUS
  Filled 2014-05-23 (×2): qty 0.4

## 2014-05-23 MED ORDER — DEXTROSE 5 % IV SOLN
1.0000 g | INTRAVENOUS | Status: DC
  Filled 2014-05-23 (×2): qty 1

## 2014-05-23 NOTE — Progress Notes (Addendum)
Progress Note   Sydney Hatfield YOF:[id number] DOB: 06/03/1955 DOA: 05/22/2014 PCP: Gennette Pac, MD   Brief Narrative:   Sydney Hatfield is an 59 y.o. female with a PMH of MR, aspiration, torticollis and subacute abdominal pain s/p ultrasound on 01/27/2014 which revealed a distended gallbladder filled with debris/sludge and gallstones. A nuclear medicine hepatobiliary scan on 02/21/2014 revealed nonvisualization of the gallbladder. She was taken the operating room for a laparoscopic cholecystectomy on 05/10/2014. The gallbladder was inflamed and friable. Multiple hepatic nodules were noted on the surface of the liver. A core biopsy of one nodule was obtained. 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 She was seen by Dr. Benay Spice in follow up & felt to have poor prognosis for long-term disease-free survival with an estimation of her survival as weeks to a few months, with no recommendations for chemo given prognosis. She was referred to Hospice. The hospice RN referred the patient to the ED 05/22/14 for evaluation of a 24 hour history of worsening dyspnea and increased WOB. Room air oxygen saturation was 70%. Seen by palliative care coordinator and at this time, the plan is to descalate care with a goal of returning the patient to her home, where she feels safe, as soon as possible.  Assessment/Plan:   Principal Problem:  Sepsis secondary to aspiration pneumonia, worrisome for MRSA or pseudomonas given recent hospitalization / hypotension / leukocytosis. -Patient met criteria for sepsis on admission. -Source thought to be from aspiration/hospital acquired pneumonia given chronic dysphagia. -Hemodynamically stable after fluid volume resuscitation. -Initially treated with broad spectrum antibiotics with  Cefepime/Vancomycin, narrow to Levaquin in an effort to allow the patient to return home as soon as possible. - Follow-up blood and urine cultures. Lactic acid 1.9, Pro calcitonin 3.18.   Active Problems:  Seizure disorder - Continue depakote.   Acute respiratory failure - Secondary to HCAP. 100% NRM. Morphine PRN air hunger.   Autistic spectrum disorder/Anxiety disorder/MR (mental retardation), moderate - Continue Klonopin and Zoloft.   Dysphagia - Family and caregiver refused reevaluation by speech therapy, diet as tolerated/comfort feeds.   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 - Palliative care consultation requested, seen by palliative care coordinator with goal to return home as soon as medically stable.   - Limited code. - Morphine and oxycodone as needed for pain. - Mother does not want the patient's diagnosis revealed to her given MR/autism.  DVT prophylaxis - Lovenox ordered.  Code Status: Limited: No CPR/intubation. Family Communication: Mother, Vonda Harth, [phone] updated on admission. Caregivers updated at the bedside. Disposition Plan: Return to her assisted living facility when medically stable, possibly 05/24/14 if she tolerates the transition to oral antibiotics.   IV Access:    Peripheral IV   Procedures and diagnostic studies:   Dg Chest 1 View 05/22/2014: Left worse than right airspace disease most worrisome for pneumonia.     Medical Consultants:    None.  Anti-Infectives:    None.  Subjective:   Sydney Hatfield seems more calmer today. No nausea or vomiting, hungry. Still requiring high oxygen. Unable to clearly communicate with me.  Objective:    Filed Vitals:   05/23/14 0800 05/23/14 0830 05/23/14 1000 05/23/14 1401  BP: 111/64 112/67 116/70 84/52  Pulse: 96 96 98 97  Temp:   98.7 F (37.1 C) 98.9 F (37.2 C)  TempSrc:  Axillary Axillary  Resp: 20 25 24 22   Height:   5\' 1"  (1.549 m)     SpO2: 98% 98% 98%     Intake/Output Summary (Last 24 hours) at 05/23/14 1426 Last data filed at 05/23/14 1413  Gross per 24 hour  Intake     50 ml  Output      0 ml  Net     50 ml    Exam: Gen:  Restless, but less so than yesterday Cardiovascular:  Mildly tachycardic, No M/R/G Respiratory:  Lungs diminished Gastrointestinal:  Abdomen soft, NT/ND, + BS Extremities:  No C/E/C   Data Reviewed:    Labs: Basic Metabolic Panel:  Recent Labs Lab 05/18/14 1544 05/22/14 1353 05/23/14 1020  NA 139 137 137  K 3.9 4.5 4.7  CL  --  103 103  CO2 21* 18* 25  GLUCOSE 115 126* 85  BUN 33.3* 45* 25*  CREATININE 1.3* 1.18* 0.63  CALCIUM 8.5 8.4 7.9*   GFR Estimated Creatinine Clearance: 57.8 mL/min (by C-G formula based on Cr of 0.63). Liver Function Tests:  Recent Labs Lab 05/22/14 1353  AST 46*  ALT 24  ALKPHOS 211*  BILITOT 1.0  PROT 6.1  ALBUMIN 1.9*    Recent Labs Lab 05/22/14 1353  LIPASE 29   Coagulation profile  Recent Labs Lab 05/23/14 1020  INR 1.72*    CBC:  Recent Labs Lab 05/18/14 1543 05/22/14 1353 05/23/14 1020  WBC 30.8* 18.8* 16.8*  NEUTROABS 22.8* 14.7*  --   HGB 6.8* 9.0* 8.3*  HCT 22.0* 28.7* 25.6*  MCV 85.3 85.7 85.3  PLT 218 184 146*   Cardiac Enzymes:  Recent Labs Lab 05/22/14 1353  TROPONINI <0.03   Sepsis Labs:  Recent Labs Lab 05/18/14 1543 05/22/14 1353 05/23/14 1020  PROCALCITON  --   --  3.18  WBC 30.8* 18.8* 16.8*  LATICACIDVEN  --   --  1.9   Microbiology Recent Results (from the past 240 hour(s))  TECHNOLOGIST REVIEW     Status: None   Collection Time: 05/18/14  3:43 PM  Result Value Ref Range Status   Technologist Review Occ. Metas and Myelocytes present, few ovalos  Final  Culture, blood (routine x 2)     Status: None (Preliminary result)   Collection Time: 05/22/14  2:32 PM  Result Value Ref Range Status   Specimen Description BLOOD  RIGHT AC  Final   Special Requests BOTTLES DRAWN AEROBIC  AND ANAEROBIC 07/18/14  Final   Culture   Final           BLOOD CULTURE RECEIVED NO GROWTH TO DATE CULTURE WILL BE HELD FOR 5 DAYS BEFORE ISSUING A FINAL NEGATIVE REPORT Performed at 05/24/14    Report Status PENDING  Incomplete  Culture, blood (routine x 2)     Status: None (Preliminary result)   Collection Time: 05/22/14  2:34 PM  Result Value Ref Range Status   Specimen Description BLOOD RIGHT HAND  Final   Special Requests BOTTLES DRAWN AEROBIC AND ANAEROBIC [employer]  Final   Culture   Final           BLOOD CULTURE RECEIVED NO GROWTH TO DATE CULTURE WILL BE HELD FOR 5 DAYS BEFORE ISSUING A FINAL NEGATIVE REPORT Performed at 05/24/14    Report Status PENDING  Incomplete     Medications:   . clonazePAM  0.5 mg Oral 3 times per day  . clonazePAM  1 mg Oral Q24H  .  divalproex  1,000 mg Oral QHS  . enoxaparin (LOVENOX) injection  40 mg Subcutaneous Daily  . feeding supplement (ENSURE)  1 Container Oral TID BM  . hydrocortisone  25 mg Rectal BID  . levofloxacin  750 mg Oral Daily  . pantoprazole (PROTONIX) IV  40 mg Intravenous Q24H  . sertraline  200 mg Oral q morning - 10a  . sodium chloride  3 mL Intravenous Q12H   Continuous Infusions:   Time spent: 35 minutes with > 50% of time discussing current diagnostic test results, clinical impression and plan of care.    LOS: 1 day   Colleene Swarthout  [facility] Pager [phone]. If unable to reach me by pager, please call my cell phone at [phone].  *Please refer to [email or web address], password [email or web address] to get updated schedule on who will round on this patient, as hospitalists switch teams weekly. If 7PM-7AM, please contact night-coverage at [email or web address], password [email or web address] for any overnight needs.  05/23/2014, 2:26 PM

## 2014-05-23 NOTE — Plan of Care (Signed)
Problem: Phase I Progression Outcomes Goal: Voiding-avoid urinary catheter unless indicated Outcome: Progressing Patient is incontinent. Has diaper from the Group home.

## 2014-05-23 NOTE — Care Management Note (Signed)
CARE MANAGEMENT NOTE 05/23/2014  Patient:  Sydney Hatfield, Sydney Hatfield   Account Number:  [id number]  Date Initiated:  05/23/2014  Documentation initiated by:  Marney Doctor  Subjective/Objective Assessment:   59 yo admitted with Sepsis secondary to aspiration pneumonia. Hx of Autistic spectrum disorder/Anxiety disorder/MR (mental retardation) and seizure disorder.     Action/Plan:   From [facility] in [place].   Anticipated DC Date:  05/24/2014   Anticipated DC Plan:  [place]  CM consult      Choice offered to / List presented to:          [facility] arranged  HH-10 DISEASE MANAGEMENT      HH agency  HOSPICE AND PALLIATIVE CARE OF    Status of service:  In process, will continue to follow Medicare Important Message given?   (If response is "NO", the following Medicare IM given date fields will be blank) Date Medicare IM given:   Medicare IM given by:   Date Additional Medicare IM given:   Additional Medicare IM given by:    Discharge Disposition:    Per UR Regulation:  Reviewed for med. necessity/level of care/duration of stay  If discussed at [place] of Stay Meetings, dates discussed:    Comments:  05/23/14 Marney Doctor RN,BSN,NCM [phone] Please see ED CM note.  CM consult to contact [facility] to have them evaluate pt while here in the hospital and to dc with hospice services at group home. [facility] had been contacted for evaluation prior to pt admission.  [facility] referral center called to give referral. Pt will need hospital bed at group home.  CM will follow and assist with DC as needed.

## 2014-05-23 NOTE — Progress Notes (Addendum)
Notified by Chauncey Fischer of pt/family request for [facility] services at home after discharge. Chart and patient information reviewed with Dr. Brigid Re [facility] Medical Director and hospice eligibility confirmed.  Writer spoke with Posey Rea on the phone about going home with hospice tomorrow. [name] is aware of hospice philosophy, services, and team approach to care when Newman Nickels SN with [facility] came out to the house to admit patient to hospice services. Per discussion plan is for discharge to group home by [facility] on 3-16.  Please send signed completed DNR form home with pt/family. Patient will need prescriptions for discharge comfort medications.  DME needs discussed with [name] and she has requested the following DME for delivery to the group home tomorrow; Hospital bed package with 2 sets of 1/2 rails, OBT, APP and tub seat with back. [facility] equipment manager Jewel Ysidro Evert notified and will contact [facility] to arrange delivery to the home.  The home address has been verified and is correct in the chart; [name] is the primary contact to arrange time of delivery.  [facility] aware of above Completed d/c summary will need to be faxed to [facility] at [phone] when final  Please notify [facility] when pt is ready to leave unit at discharge- call [phone] (or [phone] after 5pm) [facility] information and contact numbers have been given to Posey Rea Please call with any questions  Ardath Sax RN BSN [facility] Liaison  [phone]

## 2014-05-23 NOTE — ED Notes (Signed)
Per Dr Rockne Menghini, pt is a limited code, prognosis poor, telemetry would not be in pts best interest. So med surge bed is appropriate.

## 2014-05-23 NOTE — Progress Notes (Addendum)
Pharmacy Consult Note - Cefepime and Vancomycin  Labs: Scr 0.86, improved with CrCl 58 ml/min  A/P: per Md orders, switching Zosyn to cefepime per pharmacy dosing for sepsis/HCAP. Per renal function already stated on previous pharmacy consult note on 3/14, start cefepime 1g q12 based on current CrCl. Will monitor renal function for any dosage changes. With improvement in SCr compared to yesterday, will change vancomycin dosing to 500mg  IV q12 as well  Adrian Saran, PharmD, BCPS Pager [phone] 05/23/2014 10:26 AM

## 2014-05-23 NOTE — Progress Notes (Signed)
Patient arrived to unit at around 10 am. Alert,restless. Care giver at bedside. RN to continue to monitor.

## 2014-05-23 NOTE — Evaluation (Signed)
SLP Cancellation Note  Patient Details Name: [name] MRN: [id number] DOB: 1955/10/19   Cancelled treatment:       Reason Eval/Treat Not Completed: Other (comment) (spoke to pt's sister and caregiver and both state desire to not have pt re=evaluated, please reorder if desire)   Luanna Salk, [facility] SLP [phone]

## 2014-05-23 NOTE — ED Notes (Signed)
Charge nurse spoke to Rama, md, pt to change to med surge bed

## 2014-05-23 NOTE — Consult Note (Signed)
Goals of care meeting with patient sister, Sydney Hatfield who is a spokes person for the family.  She agrees to discuss goals and then relay the information to her parents who are at home resting from the events of the past 24hrs.  As noted the patient has been recently diagnosed with metastatic carcinoma after a lap/cholecystectomy with biopsy in March.    Sydney Hatfield suffers from MR autism and has resided in a group home in [place] for about 10 years.  Her baseline communication is verbal but her comprehension of the situation is not fully realized.  The family has decided not to tell Sydney Hatfield the details of her condition due to the emotional strain they feel it will place on her.  She does know that she is very sick.    [facility] was present at the home yesterday for in home admission.  After assessment by the [facility] RN the decision was made to transport the [facility] for evaluation of increasing SOB and weakness.  According to Sydney Hatfield this transport was very distressing for Sydney Hatfield who had stated recently "if I go  back to the hospital, I will die, I don't want to be there".    Goals discussion included the terminal disease process as well as the risk co-morbid conditions that accompany surgery and debility.  We discussed that overall her prognosis is poor but that providing quality, symptom management and emotional well being by supporting Sydney Hatfield in a familiar setting is possible.    Ultimately the goal if for Sydney Hatfield to be discharged back to her group home with hospice support.  Supporting a course of treatment to get her to a "stable" baseline where she can appreciate her home environment in the ultimate goal.    Will discuss with Dr. Rockne Menghini and determine a reasonable window for discharge.  Will also contact [facility] to initiate home hospice admit when ready.  Will follow up as needed to support transition.  Kizzie Fantasia, RN, MSN, [facility] Palliative [facility]

## 2014-05-24 MED ORDER — LEVOFLOXACIN 750 MG PO TABS: 750.0000 mg | ORAL_TABLET | Freq: Every day | ORAL | Status: AC

## 2014-05-24 MED ORDER — HYDROCODONE-ACETAMINOPHEN 5-325 MG PO TABS: 1.0000 | ORAL_TABLET | ORAL | Status: AC | PRN

## 2014-05-24 MED ORDER — HYDROCORTISONE ACETATE 25 MG RE SUPP: 25.0000 mg | Freq: Two times a day (BID) | RECTAL | Status: AC

## 2014-05-24 MED ORDER — CLONAZEPAM 0.5 MG PO TABS: 0.5000 mg | ORAL_TABLET | Freq: Three times a day (TID) | ORAL | Status: AC

## 2014-05-24 MED ORDER — DIVALPROEX SODIUM ER 250 MG PO TB24: 750.0000 mg | ORAL_TABLET | Freq: Every day | ORAL | Status: AC

## 2014-05-24 MED ORDER — LORAZEPAM 2 MG/ML IJ SOLN
1.0000 mg | Freq: Once | INTRAMUSCULAR | Status: AC
  Administered 2014-05-24: 1 mg via INTRAVENOUS
  Filled 2014-05-24: qty 1

## 2014-05-24 MED ORDER — OXYCODONE HCL 5 MG/5ML PO SOLN: 5.0000 mg | ORAL | Status: AC | PRN

## 2014-05-24 MED ORDER — ONDANSETRON HCL 4 MG PO TABS: 4.0000 mg | ORAL_TABLET | Freq: Four times a day (QID) | ORAL | Status: AC | PRN

## 2014-05-24 MED ORDER — DIVALPROEX SODIUM 500 MG PO DR TAB: 1000.0000 mg | DELAYED_RELEASE_TABLET | Freq: Every day | ORAL | Status: DC

## 2014-05-24 MED ORDER — ENSURE PUDDING PO PUDG: 1.0000 | Freq: Three times a day (TID) | ORAL | Status: AC

## 2014-05-24 MED ORDER — CLONAZEPAM 1 MG PO TABS: 1.0000 mg | ORAL_TABLET | ORAL | Status: AC

## 2014-05-24 NOTE — Progress Notes (Signed)
Patient transferred to Group home via ambulance,patient maintained at 12L O2 via nonrebreather mask,patient is comfortable on d/c, no agitation noted. Caregiver and mother present on d/c. I went over d/c instructions with [facility] caregiver, verbalized understanding, teach back utilized.

## 2014-05-24 NOTE — Plan of Care (Signed)
Problem: Phase III Progression Outcomes Goal: Voiding independently Outcome: Progressing Patient is incontinent.

## 2014-05-24 NOTE — Progress Notes (Signed)
Follow-up: discussions this morning with pt's caregiver Micah Flesher with concerns about DME delivery and need for O2 at the home- per discussion with pt's staff RN Butch Penny pt currently on O2 NRB Mask at12 LNC ; Butch Penny did try to wean pt to [place] however reports O2 sats not able to remain above 90; per discussion with staff RN Butch Penny pt now placed back on NRBM at [address] [place] and she will confirm with discharging MD O2 needs/order for home to be added to discharge instructions All DME needs to be in the home prior to discharge  -Probation officer contacted [facility] and confirmed that [facility] was contacted this morning with added request for O2 (and previously ordered Hospital bed pkg D) to be delivered today -  Ms Jerline Pain [phone]) is the contact to arrange delivery time - Ms Jerline Pain will make the unit staff aware as soon as DME is in the home Per discussion pt will d/c by PTAR *Please send completed MOST form home with pt as it is noted in d/c summary pt is a partial code [facility] Referral Center will need to be contacted at [phone] when pt is ready to d/c home  Flora MSN [facility] Liaison [phone]

## 2014-05-24 NOTE — Progress Notes (Signed)
Follow up on home with hospice needs Writer saw pt at bedside, mother Sydney Hatfield and Sydney Hatfield present pt moaning writihing in the bed caregiver reports pt has not been comfortable most of the night however when asked if she is having pain pt has stated 'no' both caregiver and mother informed that because of pt's MR/autism pt's responses to such questions are often not reliable and they have been trying to look at pt's behavior -which currently on visit pt appears in distress; face flushed, skin warm, moaning RR= 20 , audible upper airway congestion noted- O2 at 12LNC NRBM, Larey Brick present and administered a one time dose of 1 mg IV Ativan Needs at home discussed with Caregiver and mother, Sydney Hatfield- they both expressed wanting full comfort and no return to the hospital if at all possible- discussed [facility] RN availability 24/7; discussed current code status listed as 'Partial" - both mother and caregiver asked for clarification and stated they would not want any further interventions IV medications that all they want is for Sydney Hatfield to be comfortable - they asked for review of pt's medications prior to discharge to assure that they would have whatever prescriptions would be needed and also would like for pt to have IV pain medication prior to PTAR transport to assure comfort  Writer spoke with Staff RN Butch Penny, CSW Janifer Adie about above; Dr Charlies Silvers paged to discuss family's concerns await call back DME is currently at the home; plan is for transfer once orders are clarified with attending MD Danton Sewer, RN MSN [facility] Liaison

## 2014-05-24 NOTE — Progress Notes (Signed)
Pt is on 12 L NRM oxygen support.  [facility] [phone]

## 2014-05-24 NOTE — Discharge Instructions (Signed)
Sepsis °Sepsis is a serious infection of your blood or tissues that affects your whole body. The infection that causes sepsis may be bacterial, viral, fungal, or parasitic. Sepsis may be life threatening. Sepsis can cause your blood pressure to drop. This may result in shock. Shock causes your central nervous system and your organs to stop working correctly.  °RISK FACTORS °Sepsis can happen in anyone, but it is more likely to happen in people who have weakened immune systems. °SIGNS AND SYMPTOMS  °Symptoms of sepsis can include: °· Fever or low body temperature (hypothermia). °· Rapid breathing (hyperventilation). °· Chills. °· Rapid heartbeat (tachycardia). °· Confusion or light-headedness. °· Trouble breathing. °· Urinating much less than usual. °· Cool, clammy skin or red, flushed skin. °· Other problems with the heart, kidneys, or brain. °DIAGNOSIS  °Your health care provider will likely do tests to look for an infection, to see if the infection has spread to your blood, and to see how serious your condition is. Tests can include: °· Blood tests, including cultures of your blood. °· Cultures of other fluids from your body, such as: °¨ Urine. °¨ Pus from wounds. °¨ Mucus coughed up from your lungs. °· Urine tests other than cultures. °· X-ray exams or other imaging tests. °TREATMENT  °Treatment will begin with elimination of the source of infection. If your sepsis is likely caused by a bacterial or fungal infection, you will be given antibiotic or antifungal medicines. °You may also receive: °· Oxygen. °· Fluids through an IV tube. °· Medicines to increase your blood pressure. °· A machine to clean your blood (dialysis) if your kidneys fail. °· A machine to help you breathe if your lungs fail. °SEEK IMMEDIATE MEDICAL CARE IF: °You get an infection or develop any of the signs and symptoms of sepsis after surgery or a hospitalization. °Document Released: 11/23/2002 Document Revised: 03/01/2013 Document Reviewed:  11/01/2012 °ExitCare® Patient Information ©2015 [employer]. This information is not intended to replace advice given to you by your health care provider. Make sure you discuss any questions you have with your health care provider. ° °

## 2014-05-24 NOTE — Discharge Summary (Addendum)
Physician Discharge Summary  Sydney Hatfield HTX:[id number] DOB: [birth year] DOA: 05/22/2014  PCP: Gennette Pac, MD  Admit date: 05/22/2014 Discharge date: 05/24/2014  Recommendations for Outpatient Follow-up:  1. Continue Levaquin for 5 days on discharge. 2. On 12 L NRM oxygen support  Discharge Diagnoses:  Principal Problem:   Sepsis Active Problems:   Hypotension   Seizure   Acute respiratory failure   Autistic spectrum disorder   Anxiety disorder   MR (mental retardation), moderate   Dysphagia   Leukocytosis   Gallbladder carcinoma   Aspiration pneumonia    Discharge Condition: stable   Diet recommendation: as tolerated   History of present illness:   59 y.o. female with a PMH of MR, aspiration, torticollis and subacute abdominal pain s/p ultrasound on 01/27/2014 which revealed a distended gallbladder filled with debris/sludge and gallstones. A nuclear medicine hepatobiliary scan on 02/21/2014 revealed nonvisualization of the gallbladder. She was taken the operating room for a laparoscopic cholecystectomy on 05/10/2014. The gallbladder was inflamed and friable. Multiple hepatic nodules were noted on the surface of the liver. A core biopsy of one nodule was obtained. 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 She was seen by Dr. Benay Spice in follow up & felt to have poor prognosis for long-term disease-free survival with an estimation of her survival as weeks to a few months, with no recommendations for chemo given the prognosis. She was referred to Hospice. The hospice RN referred the patient to the ED 05/22/14 for evaluation of a 24 hour history of worsening dyspnea and increased WOB. Room air oxygen saturation was 70%. Seen by palliative care coordinator.   Assessment/Plan:   Principal  Problem: Sepsis secondary to aspiration pneumonia, worrisome for MRSA or pseudomonas given recent hospitalization / hypotension / leukocytosis. - Patient met criteria for sepsis on admission. - Source thought to be from aspiration/hospital acquired pneumonia given chronic dysphagia. - Patient will continue Levaquin for 5 days on discharge. She was on broad-spectrum antibiotics, cefepime and vancomycin on the admission. -  Lactic acid 1.9, Pro calcitonin 3.18.  - Blood cultures to date are negative. Urine culture shows no growth.  Active Problems:  Seizure disorder - Continue depakote.   Acute respiratory failure with hypoxia - Secondary to HCAP. 100% NRM.     Autistic spectrum disorder/Anxiety disorder/MR (mental retardation), moderate - Continue Klonopin and Zoloft.   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 - Patient has been seen by palliative care team in consultation. Plan is for returning to group home with hospice. - Continue pain management efforts.   Code Status:  No CPR or intubation, Yes to ACLS meds and BiPAP Family Communication: Mother, Kody Vigil, [phone] updated on admission. Caregivers updated at the bedside.   IV Access:    Peripheral IV   Procedures and diagnostic studies:   Dg Chest 1 View 05/22/2014: Left worse than right airspace disease most worrisome for pneumonia.   Medical Consultants:    Palliative care   Signed:  Leisa Lenz, MD  [facility] 05/24/2014, 9:22 AM  Pager #: [phone]   Discharge Exam: Filed Vitals:   05/24/14 0447  BP: 109/47  Pulse: 108  Temp: 97.4 F (36.3 C)  Resp: 20   Filed Vitals:   05/23/14 1544 05/23/14 2056 05/24/14 0017 05/24/14 0447  BP: 97/50 101/64 101/56 109/47  Pulse: 97 113 99 108  Temp:  98 F (36.7 C) 98.6 F (37 C) 97.4 F (36.3 C)   TempSrc:  Axillary Axillary Axillary  Resp:  $Remo'22 20 20  'lZCNA$ Height:      SpO2:  97% 99% 99%    General: Pt is alert, follows commands appropriately, not in acute distress Cardiovascular: Regular rate and rhythm, S1/S2 +, no murmurs Respiratory: wheezing in upper lung lobes, no rhonchi, on ventimask  Abdominal: Soft, non tender, non distended, bowel sounds +, no guarding Extremities: no edema, no cyanosis, pulses palpable bilaterally DP and PT Neuro: Grossly nonfocal  Discharge Instructions  Discharge Instructions    Call MD for:  difficulty breathing, headache or visual disturbances    Complete by:  As directed      Call MD for:  persistant nausea and vomiting    Complete by:  As directed      Call MD for:  severe uncontrolled pain    Complete by:  As directed      Diet - low sodium heart healthy    Complete by:  As directed      Increase activity slowly    Complete by:  As directed             Medication List    STOP taking these medications        ferrous sulfate 325 (65 FE) MG tablet     prenatal multivitamin Tabs tablet     ranitidine 150 MG tablet  Commonly known as:  ZANTAC     simvastatin 40 MG tablet  Commonly known as:  ZOCOR      TAKE these medications        acetaminophen 160 MG/5ML solution  Commonly known as:  TYLENOL  Take 15 mg/kg by mouth every 6 (six) hours as needed.     clonazePAM 0.5 MG tablet  Commonly known as:  KLONOPIN  Take 1 tablet (0.5 mg total) by mouth 3 (three) times daily.     clonazePAM 1 MG tablet  Commonly known as:  KLONOPIN  Take 1 tablet (1 mg total) by mouth daily.     divalproex 250 MG 24 hr tablet  Commonly known as:  DEPAKOTE ER  Take 3 tablets (750 mg total) by mouth daily.     ENSURE  Take 237 mLs by mouth 4 (four) times daily - after meals and at bedtime.     feeding supplement (ENSURE) Pudg  Take 1 Container by mouth 3 (three) times daily between meals.     esomeprazole 40 MG capsule  Commonly known as:   NEXIUM  Take 40 mg by mouth 2 (two) times daily.     HYDROcodone-acetaminophen 5-325 MG per tablet  Commonly known as:  NORCO/VICODIN  Take 1-2 tablets by mouth every 4 (four) hours as needed for moderate pain.     hydrocortisone 25 MG suppository  Commonly known as:  ANUSOL-HC  Place 1 suppository (25 mg total) rectally 2 (two) times daily.     ibuprofen 100 MG/5ML suspension  Commonly known as:  ADVIL,MOTRIN  Take 200 mg by mouth every 4 (four) hours as needed.     levofloxacin 750 MG tablet  Commonly known as:  LEVAQUIN  Take 1 tablet (750 mg total) by mouth daily.     nitrofurantoin 50 MG capsule  Commonly known as:  MACRODANTIN  Take 50 mg by mouth every evening.     ondansetron 4 MG  tablet  Commonly known as:  ZOFRAN  Take 1 tablet (4 mg total) by mouth every 6 (six) hours as needed for nausea.     oxyCODONE 5 MG/5ML solution  Commonly known as:  ROXICODONE  Take 5 mLs (5 mg total) by mouth every 4 (four) hours as needed for severe pain.     sertraline 100 MG tablet  Commonly known as:  ZOLOFT  Take 200 mg by mouth every morning.     sucralfate 1 G tablet  Commonly known as:  CARAFATE            Follow-up Information    Follow up with Gennette Pac, MD. Schedule an appointment as soon as possible for a visit in 2 weeks.   Specialty:  Family Medicine   Why:  Follow up appt after recent hospitalization   Contact information:   [place] [phone]        The results of significant diagnostics from this hospitalization (including imaging, microbiology, ancillary and laboratory) are listed below for reference.    Significant Diagnostic Studies: Dg Chest 1 View 05/22/2014   Left worse than right airspace disease most worrisome for pneumonia.   Electronically Signed   By: Inge Rise M.D.   On: 05/22/2014 13:48   Ct Head Wo Contrast 05/11/2014  1. No acute intracranial abnormalities. 2. Mild atrophy and chronic  microvascular ischemic change, unchanged from the prior study.   Electronically Signed   By: Lajean Manes M.D.   On: 05/11/2014 13:40   Ct Chest W Contrast 05/11/2014   1. 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 Largest mass lies in the right lobe near the dome measuring 4.3 cm in greatest dimension. This may reflect metastatic disease, although there is no evidence of a primary malignancy. Primary multifocal liver malignancy is possible. Alternatively, the liver masses may reflect abscesses. Although the liver was minimally imaged on the prior chest CT, these lesions were not evident. Biopsy/ sampling is recommended. 2. No other evidence of neoplastic disease. 3. Small pleural effusions. Significant dependent atelectasis mostly involving the lower lobes. No pulmonary edema. 4. Expected postsurgical changes with ill-defined fluid in the gallbladder fossa, right upper quadrant inflammatory changes, a small amount of ascites and subcutaneous air in edema along the abdominal wall. Surgical drain in the right upper quadrant is well positioned.   Electronically Signed   By: Lajean Manes M.D.   On: 05/11/2014 14:15   Ct Abdomen Pelvis W Contrast 05/11/2014   1. 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 Largest mass lies in the right lobe near the dome measuring 4.3 cm in greatest dimension. This may reflect metastatic disease, although there is no evidence of a primary malignancy. Primary multifocal liver malignancy is possible. Alternatively, the liver masses may reflect abscesses. Although the liver was minimally imaged on the prior chest CT, these lesions were not evident. Biopsy/ sampling is recommended. 2. No other evidence of neoplastic disease. 3. Small pleural effusions. Significant dependent atelectasis mostly involving the lower lobes. No pulmonary edema. 4. Expected postsurgical changes with ill-defined fluid in the gallbladder fossa, right upper quadrant inflammatory  changes, a small amount of ascites and subcutaneous air in edema along the abdominal wall. Surgical drain in the right upper quadrant is well positioned.     Microbiology:   Result Value Ref Range   Technologist Review Occ. Metas and Myelocytes present, few ovalos     Result Value Ref Range   Specimen  Description BLOOD  RIGHT AC    Special Requests BOTTLES DRAWN AEROBIC AND ANAEROBIC 5ML    Culture        Report Status PENDING     Result Value Ref Range   Specimen Description BLOOD RIGHT HAND    Special Requests BOTTLES DRAWN AEROBIC AND ANAEROBIC 3ML    Culture        Report Status PENDING     Result Value Ref Range   Specimen Description URINE, CATHETERIZED    Special Requests NONE    Culture NO GROWTH Performed at [facility]     Report Status 05/23/2014 FINAL      Labs: Basic Metabolic Panel:  Recent Labs Lab 05/18/14 1544 05/22/14 1353 05/23/14 1020  NA 139 137 137  K 3.9 4.5 4.7  CL  --  103 103  CO2 21* 18* 25  GLUCOSE 115 126* 85  BUN 33.3* 45* 25*  CREATININE 1.3* 1.18* 0.63  CALCIUM 8.5 8.4 7.9*   Liver Function Tests:  Recent Labs Lab 05/22/14 1353  AST 46*  ALT 24  ALKPHOS 211*  BILITOT 1.0  PROT 6.1  ALBUMIN 1.9*    Recent Labs Lab 05/22/14 1353  LIPASE 29   No results for input(s): AMMONIA in the last 168 hours. CBC:  Recent Labs Lab 05/18/14 1543 05/22/14 1353 05/23/14 1020  WBC 30.8* 18.8* 16.8*  NEUTROABS 22.8* 14.7*  --   HGB 6.8* 9.0* 8.3*  HCT 22.0* 28.7* 25.6*  MCV 85.3 85.7 85.3  PLT 218 184 146*   Cardiac Enzymes:  Recent Labs Lab 05/22/14 1353  TROPONINI <0.03   BNP: BNP (last 3 results)  Recent Labs  05/22/14 1353  BNP 212.5*    ProBNP (last 3 results) No results for input(s): PROBNP in the last 8760 hours.  CBG: No results for input(s): GLUCAP in the last 168 hours.  Time coordinating discharge: Over 30 minutes     oka emotional breakdown of the

## 2014-05-24 NOTE — Progress Notes (Addendum)
Pt discharging back to [place] with [facility] following at [place].   Caregiver and mother, Benjamine Mola at bedside- they both expressed wanting full comfort and no return to the hospital and wish for full DNR. MD notified and Gold DNR completed. Group Home confirmed that pt equipment had arrived and pt mother and caregiver eager for pt to get back to [place]. Pt appeared more comfortable at time of visit and pt mother and caregiver appreciative of CSW support and assistance with discharge today.   CSW arranged ambulance transport via [place] for pt back to [place]. RN aware.   No further social work needs identified at this time.  CSW signing off.   Alison Murray, MSW, [place] Work [phone]

## 2014-05-25 NOTE — Progress Notes (Signed)
Clinical Social Work Department BRIEF PSYCHOSOCIAL ASSESSMENT 06/05/2014  Patient:  Sydney Hatfield, Sydney Hatfield     Account Number:  [id number]     [place] date:  05/22/2014  Clinical Social Worker:  Maryln Manuel  Date/Time:  05/24/2014 10:00 AM  Referred by:  Physician  Date Referred:  05/09/2014 Referred for  ALF Placement   Other Referral:   Pt admitted from [facility] type:  Family Other interview type:    PSYCHOSOCIAL DATA Living Status:  FACILITY Admitted from facility:  Other Level of care:  Group Home Primary support name:  Benjamine Mola Capley/[phone] Primary support relationship to patient:  PARENT Degree of support available:   strong    CURRENT CONCERNS Current Concerns  Post-Acute Placement   Other Concerns:    SOCIAL WORK ASSESSMENT / PLAN CSW received referral that pt admitted from [facility] and plan is for pt to return to [facility] today with Hospice and [facility] to follow.    CSW met with pt mother and pt caregiver from group home, Letithia at bedside. CSW introduced self and explained role. Pt mother confirmed plan to return to group home today. Pt caregiver eager for pt to get back to group home in order for pt to get comfortable and be admitted to [facility]osp[facility] and [facility].    CSW had discharge information available at time of visit and reviewed discharge information with group home manager/caregiver to confirm that all medications were correct. Hospice and [facility] will assist in adjusting pt medications for comfort.    Danton Sewer, [facility] RN liaison, entered room and discussed with pt mother regarding pt code status. Pt mother shared that yesterday she had wanted Partial Code, but today pt has declined more and pt mother mother wishes for a full DNR. CSW notified MD and had [place] DNR form completed.    CSW to arrange ambulace transport when confirmed  that equipment has arrived to pt group home.    CSW to continue to follow.   Assessment/plan status:  Psychosocial Support/Ongoing Assessment of Needs Other assessment/ plan:   discharge planning   Information/referral to community resources:   Referal back to [place]    PATIENT'S/FAMILY'S RESPONSE TO PLAN OF CARE: Pt oriented to person only. Pt currently on non-rebreather mask. Pt with history of Autistic spectrum disorder and MR and unable to participate in assessment. Pt mother emotional at bedside as she does not feel that pt is comfortable. RN aware. Pt group home is very supportive of pt and pt mother and all are eager for pt to get back to group home with hospice.    Alison Murray, MSW, [place] Work [phone]

## 2014-05-28 LAB — CULTURE, BLOOD (ROUTINE X 2)
CULTURE: NO GROWTH
Culture: NO GROWTH

## 2014-06-09 DEATH — deceased

## 2016-04-08 IMAGING — CT CT HEAD W/O CM
2 series · 15 of 30 positions shown, 19 images · non-contrast
Comparison: 03/23/2012

CLINICAL DATA: Hx of autism, moderate mental retardation,
fibromyalgia

Pt has torticollis
Pt had surgery [DATE] for cholecystitis; masses seen in both liver beds
EXAM:
CT HEAD WITHOUT CONTRAST
TECHNIQUE: Contiguous axial images were obtained from the base of the skull
through the vertex without intravenous contrast.

[Series 201: head w/o, idose (1) · axial · non-contrast · 0.44mm/px · z∈[+110,+255]mm · 13 of 35 slices shown, 17 images]
[im 3/35  brain]
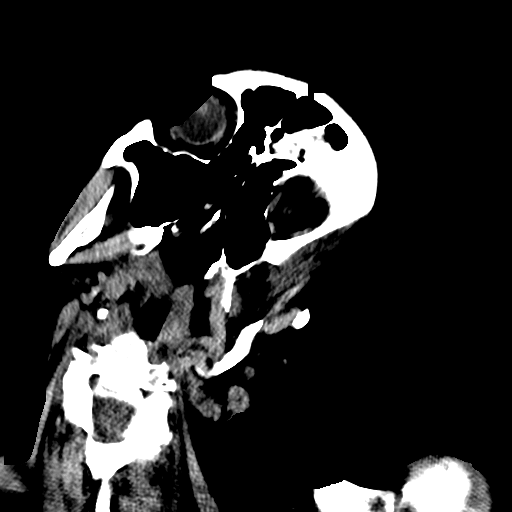
[im 3/35  bone]
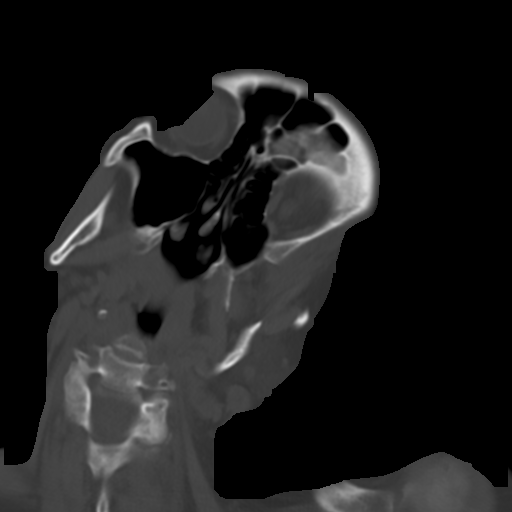
[im 5/35  brain]
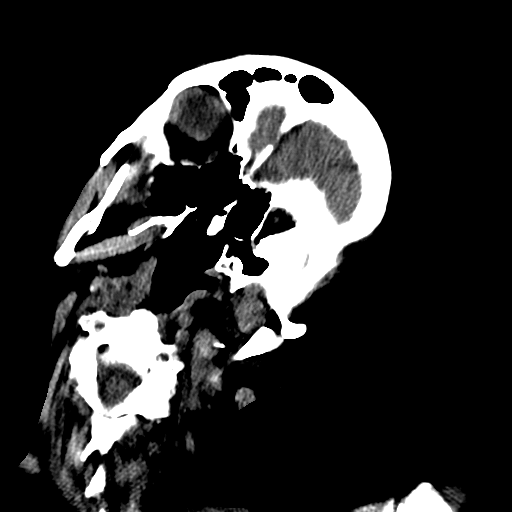
[im 8/35  brain]
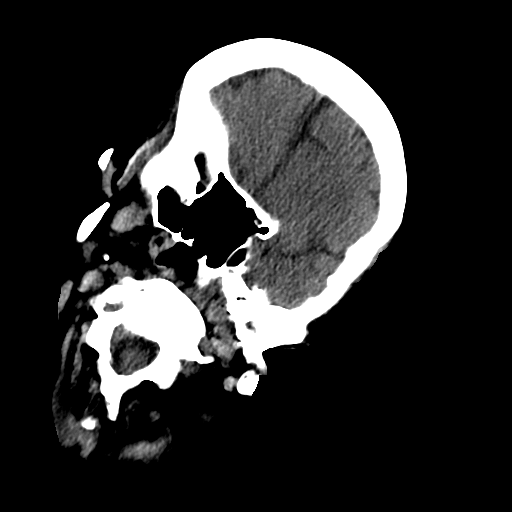
[im 10/35  brain]
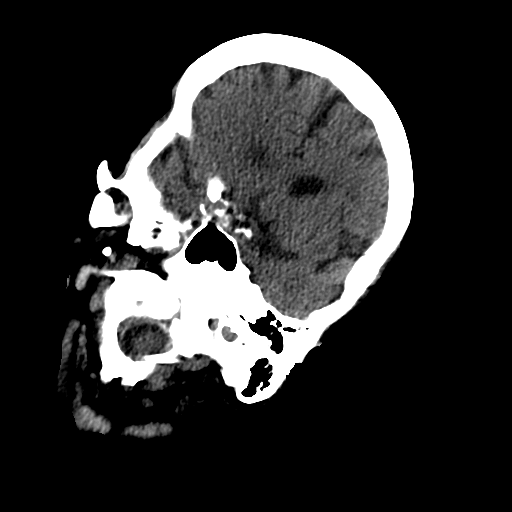
[im 13/35  brain]
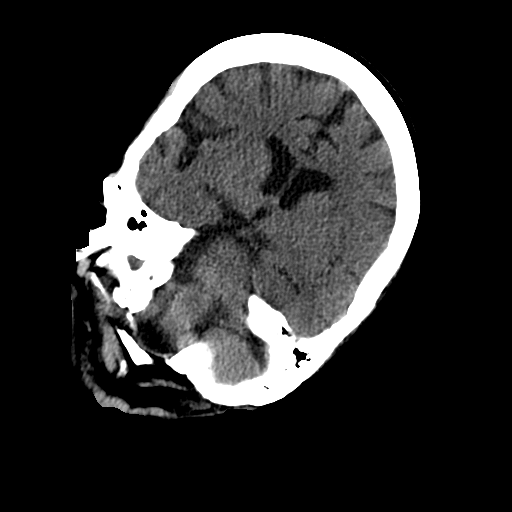
[im 13/35  bone]
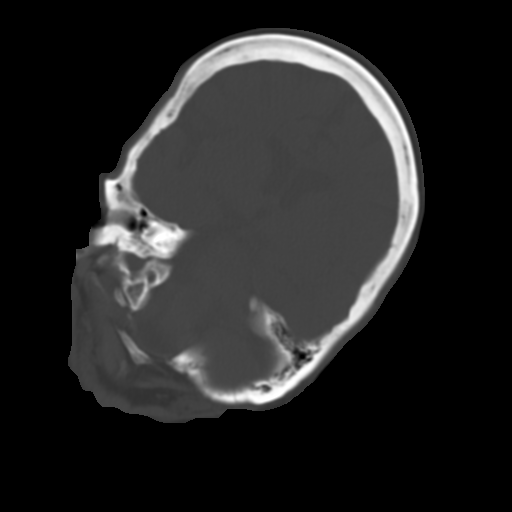
[im 15/35  brain]
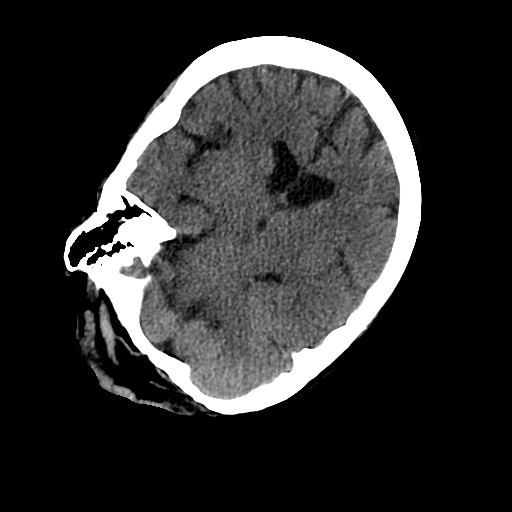
[im 18/35  brain]
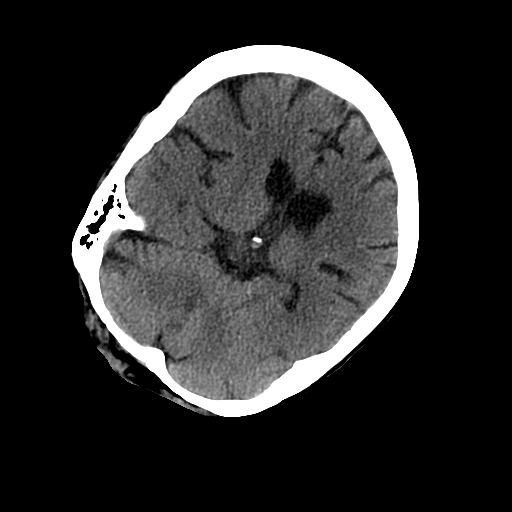
[im 20/35  brain]
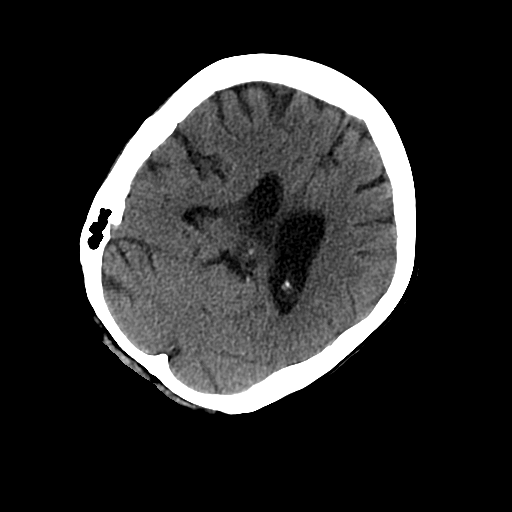
[im 22/35  brain]
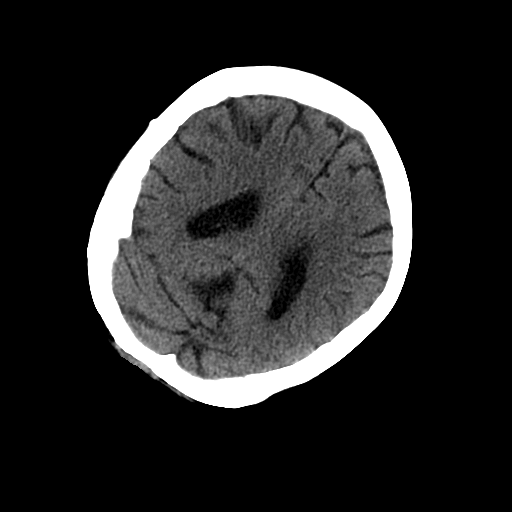
[im 22/35  bone]
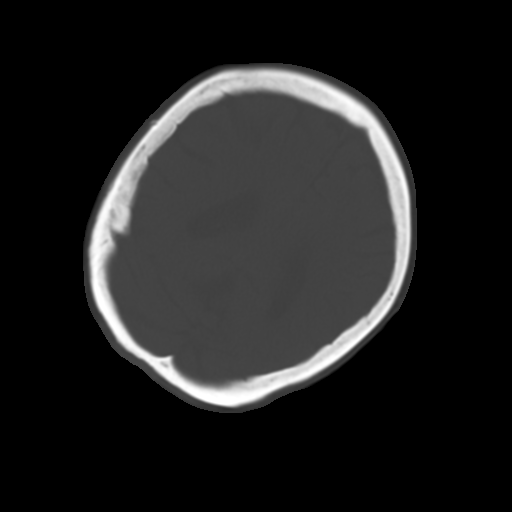
[im 25/35  brain]
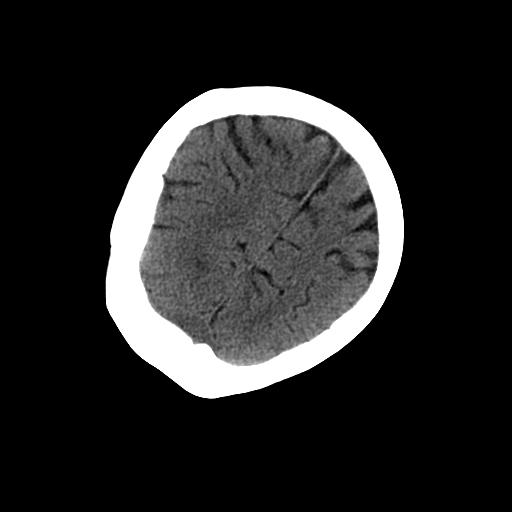
[im 27/35  brain]
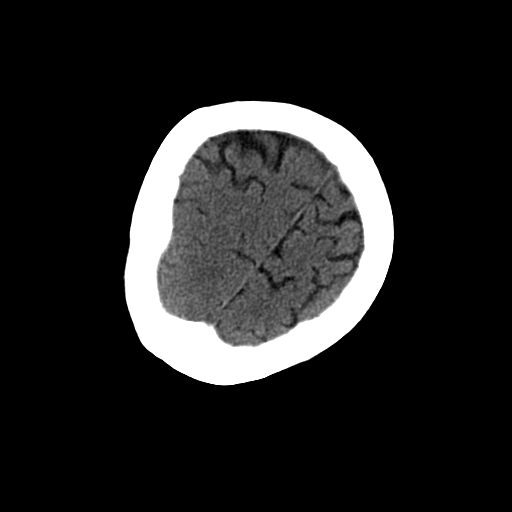
[im 30/35  brain]
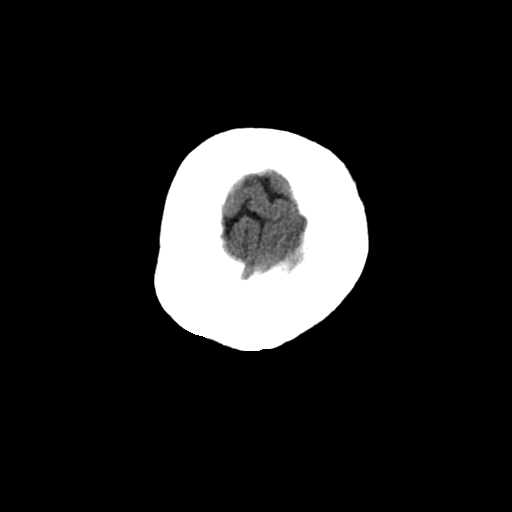
[im 32/35  brain]
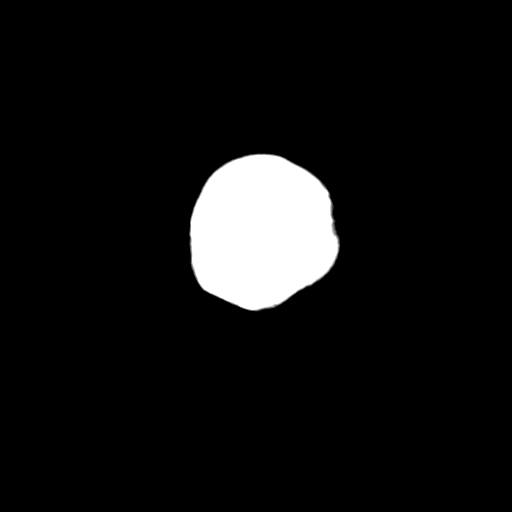
[im 32/35  bone]
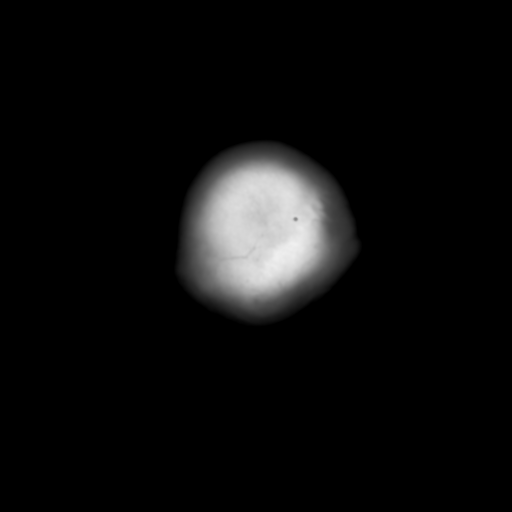

[Series 202: head w/o bone, idose (1) · axial · non-contrast · 0.44mm/px · z∈[+110,+135]mm · 2 of 35 slices shown]
[im 3/35  bone]
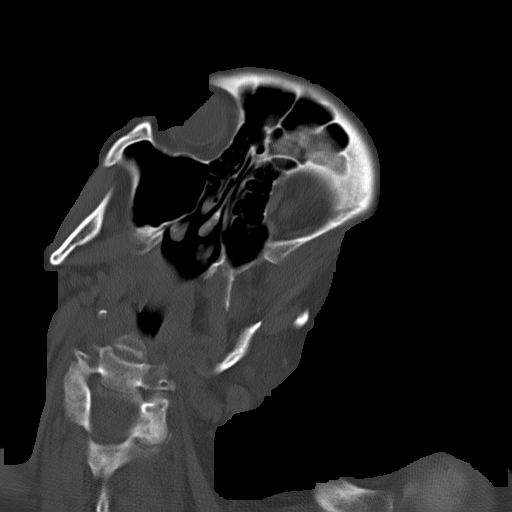
[im 8/35  bone]
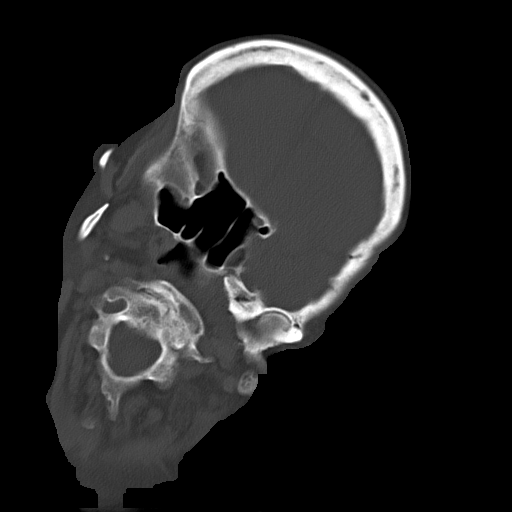

[15 of 30 positions shown; findings below may reference images not displayed]

FINDINGS: Ventricles are normal configuration. There is ventricular and sulcal
enlargement reflecting mild atrophy. No hydrocephalus.

No parenchymal masses or mass effect. There is no evidence of
cortical infarct. Mild white matter hypoattenuation is noted
consistent with chronic microvascular ischemic change.

No extra-axial masses or abnormal fluid collections.

There is no intracranial hemorrhage.

Visualized sinuses and mastoid air cells are clear.
IMPRESSION: 1. No acute intracranial abnormalities.
2. Mild atrophy and chronic microvascular ischemic change, unchanged
from the prior study.

## 2016-04-08 IMAGING — CT CT ABD-PELV W/ CM
1 of 5 series · 3 of 36 positions shown, 4 images · IV contrast (Iodine)
Comparison: Chest CT, 04/01/2012

CLINICAL DATA: Hx of autism, moderate mental retardation,
fibromyalgiaPt has torticollisPt had surgery [DATE] for cholecystitis;
masses seen in both liver beds

EXAM:
CT CHEST, ABDOMEN, AND PELVIS WITH CONTRAST
TECHNIQUE: Multidetector CT imaging of the chest, abdomen and pelvis was
performed following the standard protocol during bolus
administration of intravenous contrast.
CONTRAST:  100mL OMNIPAQUE IOHEXOL 300 MG/ML  SOLN

[Series 205: coronal · coronal · 0.50mm/px · 3 of 73 slices shown, 4 images]
[im 15/73  mediastinal]
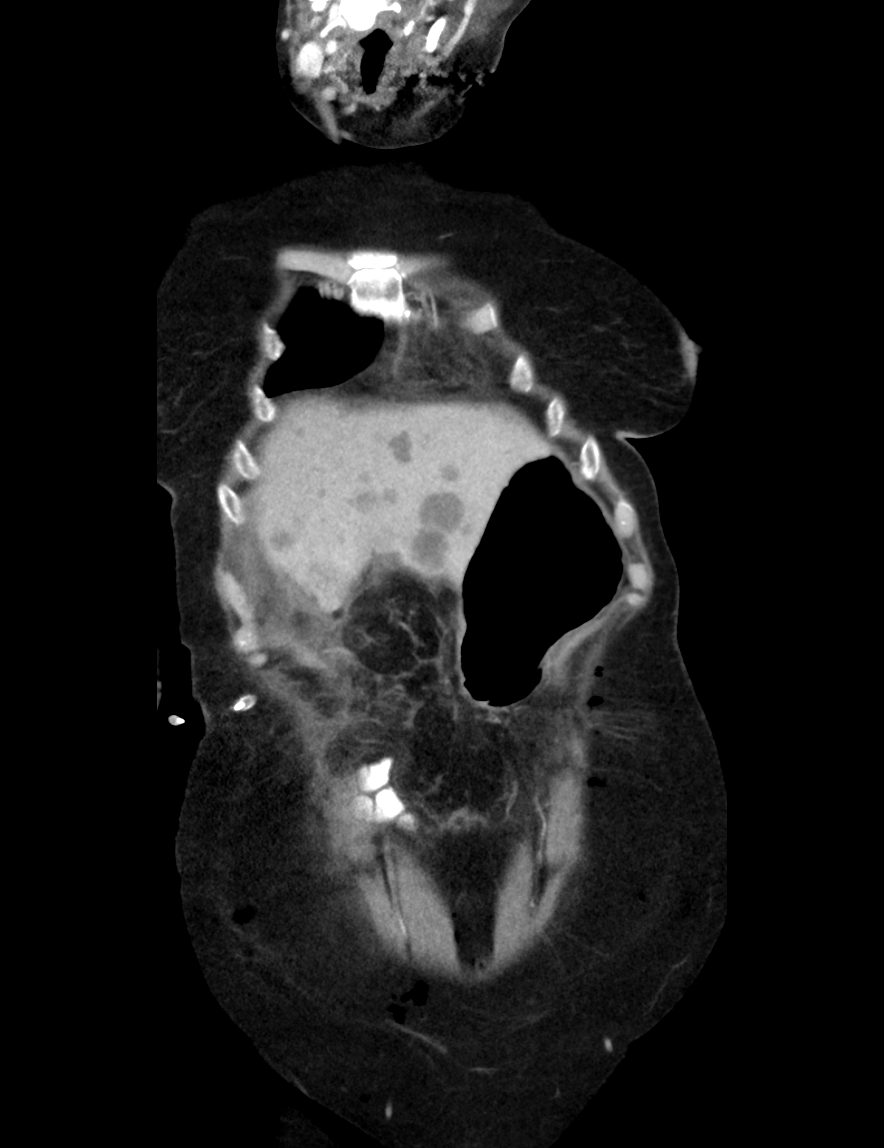
[im 15/73  lung]
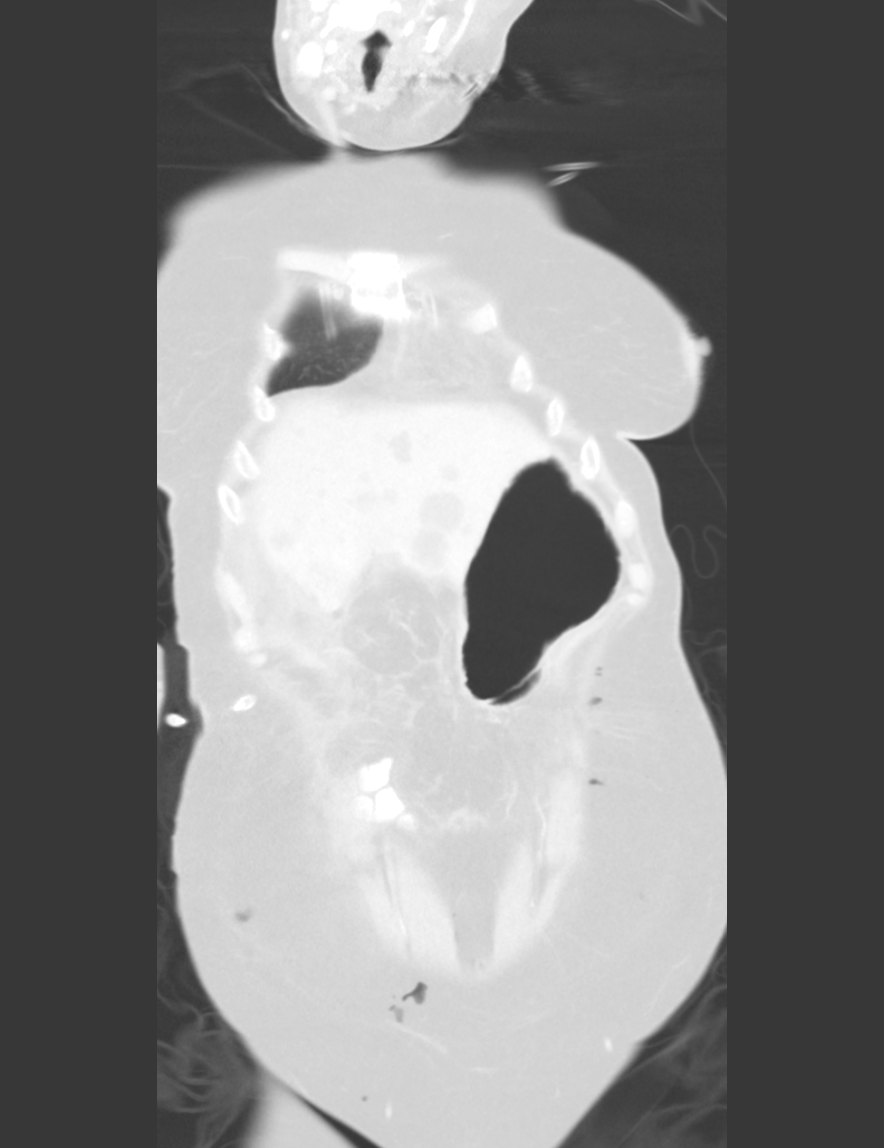
[im 44/73  lung]
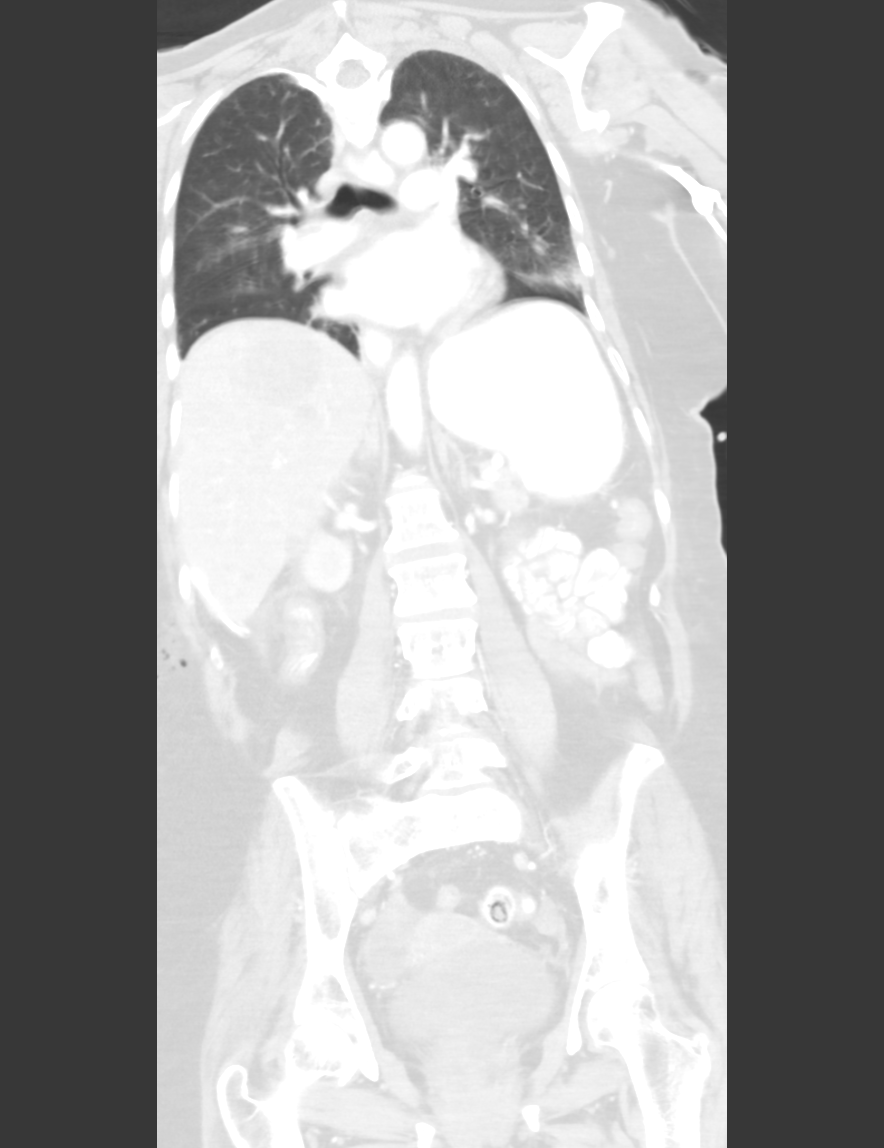
[im 58/73  lung]
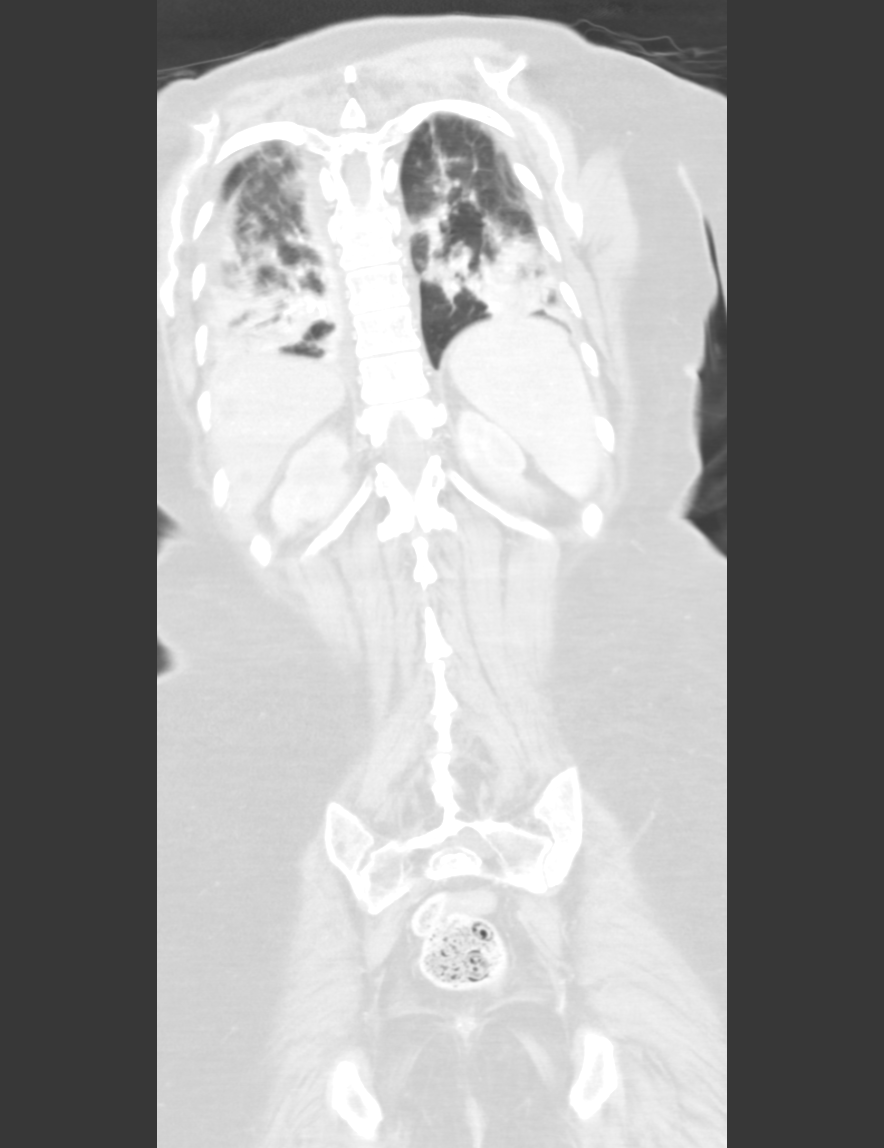

[3 of 36 positions shown; findings below may reference images not displayed]

FINDINGS: CT CHEST FINDINGS

Neck base and axilla:  No masses or adenopathy.

Mediastinum and hila: Heart mildly enlarged. Mild to moderate
coronary artery calcifications. Great vessels normal in caliber. No
mediastinal or hilar masses or pathologically enlarged lymph nodes.

Lungs and pleura: Small, right greater than left, pleural effusions.
There is bilateral lower lobe opacity that is most likely all
atelectasis. There is associated volume loss supporting atelectasis,
particularly on the left. There is also some dependent opacity in
the left upper lobe posteriorly likely also atelectasis.
Superimposed pneumonia is possible. There is no lung mass or nodule.
There is no evidence of pulmonary edema.

There is some mild soft tissue thickening at the gastroesophageal
junction and mild dilation of the thoracic esophagus. This is
nonspecific. There is no definitive distal esophageal mass. This was
not covered on the prior chest CT.

CT ABDOMEN AND PELVIS FINDINGS

Liver: There multiple hypo attenuating liver masses which were not
evident on the limited coverage of the liver on the prior chest CT.
Reference measurements were made of the largest 2 masses. The
largest lies in the superior right lobe measuring 4.3 cm x 4 cm x 4
cm in size. Next largest lies in the lateral segment of the left
lobe measuring 2.3 cm x 1.8 cm x 1.7 cm. Numerous other liver masses
are seen. Although hypo attenuating in comparison to normal liver
parenchyma, the majority of these are not consistent with cysts. The
lesion along the anterior margin of the medial segment of the left
lobe and along the anterior medial aspect of the lateral segment of
the left lobe are more hypo attenuating and could reflect cysts.

Ill-defined fluid collection is seen in the gallbladder fossa. A
surgical drain curls along the inferomedial aspect of this. There is
also a right upper quadrant inflammatory change in some thickening
of the wall of the distal stomach and proximal duodenum. These
values are consistent with the expected postoperative change.

Spleen, pancreas, adrenal glands:  Normal.

No bile duct dilation.

Kidneys, ureters and bladder: Mild bilateral renal cortical
thinning. No renal masses. No hydronephrosis. Ureters are normal in
course and in caliber. Unremarkable bladder.

Uterus and adnexa: Unremarkable.

Lymph nodes:  No pathologically enlarged lymph nodes.

Bowel: No bowel masses. Inflammation lies adjacent to the hepatic
flexure of the colon, presumed reactive to the postsurgical change.
Several colonic diverticula. No evidence of diverticulitis. Appendix
not visualized.

Peritoneal cavity: Small amount of ascites is seen adjacent to the
liver and collecting in the posterior pelvic recess.

Abdominal wall. Postoperative subcutaneous air and edema is evident.
No mass or abscess.

MUSCULOSKELETAL

No osteoblastic or osteolytic lesions.
IMPRESSION: 1. Numerous liver masses consistent with neoplastic disease. Largest
mass lies in the right lobe near the dome measuring 4.3 cm in
greatest dimension. This may reflect metastatic disease, although
there is no evidence of a primary malignancy. Primary multifocal
liver malignancy is possible. Alternatively, the liver masses may
reflect abscesses. Although the liver was minimally imaged on the
prior chest CT, these lesions were not evident. Biopsy/ sampling is
recommended.
2. No other evidence of neoplastic disease.
3. Small pleural effusions. Significant dependent atelectasis mostly
involving the lower lobes. No pulmonary edema.
4. Expected postsurgical changes with ill-defined fluid in the
gallbladder fossa, right upper quadrant inflammatory changes, a
small amount of ascites and subcutaneous air in edema along the
abdominal wall. Surgical drain in the right upper quadrant is well
positioned.
